# Patient Record
Sex: Male | Born: 1950
Health system: Southern US, Community
[De-identification: ages and names within clinical notes are randomized; demographics above are authoritative.]

## PROBLEM LIST (undated history)

## (undated) DIAGNOSIS — G8929 Other chronic pain: Secondary | ICD-10-CM

## (undated) DIAGNOSIS — M5430 Sciatica, unspecified side: Secondary | ICD-10-CM

## (undated) DIAGNOSIS — J45909 Unspecified asthma, uncomplicated: Secondary | ICD-10-CM

## (undated) DIAGNOSIS — I10 Essential (primary) hypertension: Secondary | ICD-10-CM

## (undated) DIAGNOSIS — M549 Dorsalgia, unspecified: Secondary | ICD-10-CM

---

## 2012-01-06 ENCOUNTER — Emergency Department (HOSPITAL_COMMUNITY)
Admission: EM | Admit: 2012-01-06 | Discharge: 2012-01-07 | Disposition: A | Discharge status: Home / Self Care | Payer: Self-pay | Attending: Emergency Medicine | Admitting: Emergency Medicine

## 2012-01-06 ENCOUNTER — Emergency Department (HOSPITAL_COMMUNITY): Payer: Self-pay

## 2012-01-06 ENCOUNTER — Encounter (HOSPITAL_COMMUNITY): Payer: Self-pay | Admitting: *Deleted

## 2012-01-06 DIAGNOSIS — Z79899 Other long term (current) drug therapy: Secondary | ICD-10-CM | POA: Insufficient documentation

## 2012-01-06 DIAGNOSIS — J40 Bronchitis, not specified as acute or chronic: Secondary | ICD-10-CM

## 2012-01-06 DIAGNOSIS — R51 Headache: Secondary | ICD-10-CM | POA: Insufficient documentation

## 2012-01-06 DIAGNOSIS — R42 Dizziness and giddiness: Secondary | ICD-10-CM

## 2012-01-06 DIAGNOSIS — R062 Wheezing: Secondary | ICD-10-CM | POA: Insufficient documentation

## 2012-01-06 DIAGNOSIS — E860 Dehydration: Secondary | ICD-10-CM

## 2012-01-06 DIAGNOSIS — M545 Low back pain, unspecified: Secondary | ICD-10-CM | POA: Insufficient documentation

## 2012-01-06 DIAGNOSIS — I10 Essential (primary) hypertension: Secondary | ICD-10-CM | POA: Insufficient documentation

## 2012-01-06 DIAGNOSIS — R296 Repeated falls: Secondary | ICD-10-CM | POA: Insufficient documentation

## 2012-01-06 HISTORY — DX: Essential (primary) hypertension: I10

## 2012-01-06 LAB — COMPREHENSIVE METABOLIC PANEL
ALT: 9 U/L (ref 0–53)
Alkaline Phosphatase: 98 U/L (ref 39–117)
BUN: 20 mg/dL (ref 6–23)
CO2: 25 mEq/L (ref 19–32)
GFR calc Af Amer: 54 mL/min — ABNORMAL LOW (ref >90)
GFR calc non Af Amer: 47 mL/min — ABNORMAL LOW (ref >90)
Glucose, Bld: 89 mg/dL (ref 70–99)
Potassium: 3.4 mEq/L — ABNORMAL LOW (ref 3.5–5.1)
Sodium: 142 mEq/L (ref 135–145)
Total Protein: 7.1 g/dL (ref 6.0–8.3)

## 2012-01-06 LAB — URINALYSIS, ROUTINE W REFLEX MICROSCOPIC
Bilirubin Urine: NEGATIVE
Glucose, UA: NEGATIVE mg/dL
Ketones, ur: NEGATIVE mg/dL
Nitrite: NEGATIVE
Protein, ur: NEGATIVE mg/dL
Specific Gravity, Urine: 1.009 (ref 1.005–1.030)
Urobilinogen, UA: 0.2 mg/dL (ref 0.0–1.0)
pH: 6.5 (ref 5.0–8.0)

## 2012-01-06 LAB — COMPREHENSIVE METABOLIC PANEL WITH GFR
AST: 13 U/L (ref 0–37)
Albumin: 3.6 g/dL (ref 3.5–5.2)
Calcium: 9.5 mg/dL (ref 8.4–10.5)
Chloride: 104 meq/L (ref 96–112)
Creatinine, Ser: 1.54 mg/dL — ABNORMAL HIGH (ref 0.50–1.35)
Total Bilirubin: 0.3 mg/dL (ref 0.3–1.2)

## 2012-01-06 LAB — URINE MICROSCOPIC-ADD ON

## 2012-01-06 LAB — CBC WITH DIFFERENTIAL/PLATELET
Basophils Absolute: 0 K/uL (ref 0.0–0.1)
Basophils Relative: 1 % (ref 0–1)
Eosinophils Absolute: 1.1 10*3/uL — ABNORMAL HIGH (ref 0.0–0.7)
Eosinophils Relative: 13 % — ABNORMAL HIGH (ref 0–5)
HCT: 39.5 % (ref 39.0–52.0)
Hemoglobin: 13.4 g/dL (ref 13.0–17.0)
Lymphocytes Relative: 34 % (ref 12–46)
Lymphs Abs: 2.8 10*3/uL (ref 0.7–4.0)
MCH: 26.8 pg (ref 26.0–34.0)
MCHC: 33.9 g/dL (ref 30.0–36.0)
MCV: 79 fL (ref 78.0–100.0)
Monocytes Absolute: 0.6 K/uL (ref 0.1–1.0)
Monocytes Relative: 7 % (ref 3–12)
Neutro Abs: 3.8 K/uL (ref 1.7–7.7)
Neutrophils Relative %: 46 % (ref 43–77)
Platelets: 191 K/uL (ref 150–400)
RBC: 5 MIL/uL (ref 4.22–5.81)
RDW: 13.1 % (ref 11.5–15.5)
WBC: 8.3 10*3/uL (ref 4.0–10.5)

## 2012-01-06 MED ORDER — SODIUM CHLORIDE 0.9 % IV BOLUS (SEPSIS)
500.0000 mL | Freq: Once | INTRAVENOUS | Status: AC
  Administered 2012-01-07: 500 mL via INTRAVENOUS

## 2012-01-06 NOTE — ED Provider Notes (Signed)
History     CSN: [id number]  Arrival date & time 01/06/12  2243   First MD Initiated Contact with Patient 01/06/12 2313      Chief Complaint  Patient presents with  . Hypertension  . Back Pain    (Consider location/radiation/quality/duration/timing/severity/associated sxs/prior treatment) HPI Comments: Pt was walking to his car and got dizzy, cannot describe to me accurately if he felt vertiginous or light headed, reports he has had a mild diffuse HA today.  He reports he fell backwards and struck back.  No LOC.  No N/V or fevers.  No CP, SOB.  He took 2 tablets of his 5 mg norvasc after he fell because he felt bad.  He was told by his doctor in [place] to take 1 daily.  He has not been it seems.  Level 5 caveat due to language barrier.    Patient is a 61 y.o. male presenting with hypertension and back pain. The history is provided by the patient.  Hypertension  Back Pain     Past Medical History  Diagnosis Date  . Hypertension     History reviewed. No pertinent past surgical history.  Family History  Problem Relation Age of Onset  . Hyperlipidemia Father     History  Substance Use Topics  . Smoking status: Not on file  . Smokeless tobacco: Not on file  . Alcohol Use: No      Review of Systems  Unable to perform ROS: Other  Musculoskeletal: Positive for back pain.    Allergies  Review of patient's allergies indicates no known allergies.  Home Medications   Current Outpatient Rx  Name Route Sig Dispense Refill  . AMLODIPINE BESYLATE 5 MG PO TABS Oral Take 5 mg by mouth daily.      BP 133/80  Pulse 90  Temp 98.8 F (37.1 C) (Oral)  Resp 20  SpO2 98%  Physical Exam  Nursing note and vitals reviewed. Constitutional: He appears well-developed and well-nourished. No distress.  HENT:  Head: Normocephalic and atraumatic.  Eyes: Pupils are equal, round, and reactive to light.  Neck: Trachea normal and full passive range of motion without pain. No JVD  present. No spinous process tenderness and no muscular tenderness present. Carotid bruit is not present. No rigidity. Normal range of motion present. No thyromegaly present.  Pulmonary/Chest: Effort normal. No respiratory distress. He has no wheezes.  Abdominal: Soft. He exhibits no distension. There is no tenderness. There is no rebound.  Musculoskeletal:       Lumbar back: He exhibits tenderness and pain. He exhibits normal range of motion, no bony tenderness, no deformity, no laceration, no spasm and normal pulse.  Neurological: He is alert. He has normal strength. No cranial nerve deficit or sensory deficit. He exhibits normal muscle tone.  Reflex Scores:      Patellar reflexes are 2+ on the right side and 2+ on the left side.      Normal finger to nose, symmetric  Skin: Skin is warm and dry. No rash noted. He is not diaphoretic.    ED Course  Procedures (including critical care time)  Labs Reviewed  URINALYSIS, ROUTINE W REFLEX MICROSCOPIC - Abnormal; Notable for the following:    Hgb urine dipstick SMALL (*)     Leukocytes, UA TRACE (*)     All other components within normal limits  CBC WITH DIFFERENTIAL - Abnormal; Notable for the following:    Eosinophils Relative 13 (*)     Eosinophils Absolute  1.1 (*)     All other components within normal limits  COMPREHENSIVE METABOLIC PANEL - Abnormal; Notable for the following:    Potassium 3.4 (*)     Creatinine, Ser 1.54 (*)     GFR calc non Af Amer 47 (*)     GFR calc Af Amer 54 (*)     All other components within normal limits  LIPASE, BLOOD  TROPONIN I  URINE MICROSCOPIC-ADD ON   Dg Chest Port 1 View  01/07/2012  *RADIOLOGY REPORT*  Clinical Data: Hypertension, chest pain.  PORTABLE CHEST - 1 VIEW  Comparison: None.  Findings: Lungs are clear. No pleural effusion or pneumothorax. The cardiomediastinal contours are within normal limits. The visualized bones and soft tissues are without significant appreciable abnormality.   IMPRESSION: No radiographic evidence of acute cardiopulmonary process.  Original Report Authenticated By: Waneta Martins, M.D.   I reviewed CXR myself,   1. Dehydration   2. Dizziness   3. Bronchitis      ECG at time 0015 shows NSR at rate 91, normal axis, no ST or T wave abns.  Normal intervals, I interpret as normal ECG.  No priors.     3:40 AM After treatments, IVF's, pt feels resolved.  He is smiling, sitting up, has walked around the ED without any dizziness, vertigo, back pain, nausea.  Will d.c home.  Pt told that he never takes norvasc like he did, just take it once daily.   MDM  Pt has told RN that he has had sub fevers, some coughing and achiness at home, did not tell me this initially.  He now has mild exp wheeze.  He has no h/o asthma.  Denies smoking.    Pt's sats are 99% on RA which I interpret as normal.  I suspect pt has a viral syndrome and pt is mildly dry and likely is cause of overall symptoms.  Will give IVF's, monitor for improvement.  Ortho sats are mildly abn, not sig.          Gavin Pound. Oletta Lamas, MD 01/07/12 [phone]

## 2012-01-06 NOTE — ED Notes (Addendum)
Patient states blood pressure has been running high today and now he has headache and lower back pain, patient states he got dizzy today and then fell and after he was able to get up he came to ED, patient states some dizziness prior to falling, patient A/O x 4 at this time

## 2012-01-07 LAB — LIPASE, BLOOD: Lipase: 30 U/L (ref 11–59)

## 2012-01-07 LAB — TROPONIN I: Troponin I: <0.3 ng/mL (ref <0.30)

## 2012-01-07 MED ORDER — FENTANYL CITRATE 0.05 MG/ML IJ SOLN
25.0000 ug | Freq: Once | INTRAMUSCULAR | Status: AC
  Administered 2012-01-07: 25 ug via INTRAVENOUS
  Filled 2012-01-07: qty 2

## 2012-01-07 MED ORDER — ALBUTEROL SULFATE HFA 108 (90 BASE) MCG/ACT IN AERS
1.0000 | INHALATION_SPRAY | Freq: Four times a day (QID) | RESPIRATORY_TRACT | Status: DC | PRN
  Administered 2012-01-07: 2 via RESPIRATORY_TRACT
  Filled 2012-01-07: qty 6.7

## 2012-01-07 NOTE — Discharge Instructions (Signed)
Dehydration, Adult Dehydration is when you lose more fluids from the body than you take in. Vital organs like the kidneys, brain, and heart cannot function without a proper amount of fluids and salt. Any loss of fluids from the body can cause dehydration.  CAUSES   Vomiting.   Diarrhea.   Excessive sweating.   Excessive urine output.   Fever.  SYMPTOMS  Mild dehydration  Thirst.   Dry lips.   Slightly dry mouth.  Moderate dehydration  Very dry mouth.   Sunken eyes.   Skin does not bounce back quickly when lightly pinched and released.   Dark urine and decreased urine production.   Decreased tear production.   Headache.  Severe dehydration  Very dry mouth.   Extreme thirst.   Rapid, weak pulse (more than 100 beats per minute at rest).   Cold hands and feet.   Not able to sweat in spite of heat and temperature.   Rapid breathing.   Blue lips.   Confusion and lethargy.   Difficulty being awakened.   Minimal urine production.   No tears.  DIAGNOSIS  Your caregiver will diagnose dehydration based on your symptoms and your exam. Blood and urine tests will help confirm the diagnosis. The diagnostic evaluation should also identify the cause of dehydration. TREATMENT  Treatment of mild or moderate dehydration can often be done at home by increasing the amount of fluids that you drink. It is best to drink small amounts of fluid more often. Drinking too much at one time can make vomiting worse. Refer to the home care instructions below. Severe dehydration needs to be treated at the hospital where you will probably be given intravenous (IV) fluids that contain water and electrolytes. HOME CARE INSTRUCTIONS   Ask your caregiver about specific rehydration instructions.   Drink enough fluids to keep your urine clear or pale yellow.   Drink small amounts frequently if you have nausea and vomiting.   Eat as you normally do.   Avoid:   Foods or drinks high in  sugar.   Carbonated drinks.   Juice.   Extremely hot or cold fluids.   Drinks with caffeine.   Fatty, greasy foods.   Alcohol.   Tobacco.   Overeating.   Gelatin desserts.   Wash your hands well to avoid spreading bacteria and viruses.   Only take over-the-counter or prescription medicines for pain, discomfort, or fever as directed by your caregiver.   Ask your caregiver if you should continue all prescribed and over-the-counter medicines.   Keep all follow-up appointments with your caregiver.  SEEK MEDICAL CARE IF:  You have abdominal pain and it increases or stays in one area (localizes).   You have a rash, stiff neck, or severe headache.   You are irritable, sleepy, or difficult to awaken.   You are weak, dizzy, or extremely thirsty.  SEEK IMMEDIATE MEDICAL CARE IF:   You are unable to keep fluids down or you get worse despite treatment.   You have frequent episodes of vomiting or diarrhea.   You have blood or green matter (bile) in your vomit.   You have blood in your stool or your stool looks black and tarry.   You have not urinated in 6 to 8 hours, or you have only urinated a small amount of very dark urine.   You have a fever.   You faint.  MAKE SURE YOU:   Understand these instructions.   Will watch your condition.     Will get help right away if you are not doing well or get worse.  Document Released: 06/24/2005 Document Revised: 06/13/2011 Document Reviewed: 02/11/2011 [facility] Patient Information 2012 [place], [place].     Bronchitis Bronchitis is a problem of the air tubes leading to your lungs. This problem makes it hard for air to get in and out of the lungs. You may cough a lot because your air tubes are narrow. Going without care can cause lasting (chronic) bronchitis. HOME CARE   Drink enough fluids to keep your pee (urine) clear or pale yellow.   Use a cool mist humidifier.   Quit smoking if you smoke. If you keep smoking, the  bronchitis might not get better.   Only take medicine as told by your doctor.  GET HELP RIGHT AWAY IF:   Coughing keeps you awake.   You start to wheeze.   You become more sick or weak.   You have a hard time breathing or get short of breath.   You cough up blood.   Coughing lasts more than 2 weeks.   You have a fever.  MAKE SURE YOU:  Understand these instructions.   Will watch your condition.   Will get help right away if you are not doing well or get worse.  Document Released: 12/11/2007 Document Revised: 06/13/2011 Document Reviewed: 05/26/2009 [facility] Patient Information 2012 [place], [place].

## 2012-01-07 NOTE — ED Notes (Signed)
Pt discharged home in good condition. 

## 2012-01-07 NOTE — ED Notes (Signed)
Pt ambulated in hall with no apparent problems. Pt denies dizziness, weakness, or nausea.

## 2012-01-28 ENCOUNTER — Emergency Department (HOSPITAL_COMMUNITY): Payer: Self-pay

## 2012-01-28 ENCOUNTER — Emergency Department (HOSPITAL_COMMUNITY)
Admission: EM | Admit: 2012-01-28 | Discharge: 2012-01-28 | Disposition: A | Discharge status: Home / Self Care | Payer: Self-pay | Attending: Emergency Medicine | Admitting: Emergency Medicine

## 2012-01-28 ENCOUNTER — Encounter (HOSPITAL_COMMUNITY): Payer: Self-pay | Admitting: Emergency Medicine

## 2012-01-28 DIAGNOSIS — R059 Cough, unspecified: Secondary | ICD-10-CM | POA: Insufficient documentation

## 2012-01-28 DIAGNOSIS — I10 Essential (primary) hypertension: Secondary | ICD-10-CM | POA: Insufficient documentation

## 2012-01-28 DIAGNOSIS — R05 Cough: Secondary | ICD-10-CM | POA: Insufficient documentation

## 2012-01-28 DIAGNOSIS — Z79899 Other long term (current) drug therapy: Secondary | ICD-10-CM | POA: Insufficient documentation

## 2012-01-28 DIAGNOSIS — R0602 Shortness of breath: Secondary | ICD-10-CM | POA: Insufficient documentation

## 2012-01-28 DIAGNOSIS — J189 Pneumonia, unspecified organism: Secondary | ICD-10-CM | POA: Insufficient documentation

## 2012-01-28 LAB — CBC
HCT: 40.4 % (ref 39.0–52.0)
Hemoglobin: 13.7 g/dL (ref 13.0–17.0)
MCH: 27 pg (ref 26.0–34.0)
MCV: 79.5 fL (ref 78.0–100.0)
RBC: 5.08 MIL/uL (ref 4.22–5.81)
WBC: 7.6 10*3/uL (ref 4.0–10.5)

## 2012-01-28 LAB — BASIC METABOLIC PANEL
BUN: 19 mg/dL (ref 6–23)
CO2: 22 mEq/L (ref 19–32)
Calcium: 9.2 mg/dL (ref 8.4–10.5)
Chloride: 103 mEq/L (ref 96–112)
Creatinine, Ser: 1.48 mg/dL — ABNORMAL HIGH (ref 0.50–1.35)
Glucose, Bld: 151 mg/dL — ABNORMAL HIGH (ref 70–99)

## 2012-01-28 MED ORDER — AZITHROMYCIN 250 MG PO TABS
500.0000 mg | ORAL_TABLET | Freq: Once | ORAL | Status: AC
  Administered 2012-01-28: 500 mg via ORAL
  Filled 2012-01-28: qty 2

## 2012-01-28 MED ORDER — AZITHROMYCIN 250 MG PO TABS: 250.0000 mg | ORAL_TABLET | Freq: Every day | ORAL | Status: AC

## 2012-01-28 MED ORDER — DEXTROSE 5 % IV SOLN
1.0000 g | Freq: Once | INTRAVENOUS | Status: AC
  Administered 2012-01-28: 1 g via INTRAVENOUS
  Filled 2012-01-28: qty 10

## 2012-01-28 MED ORDER — POTASSIUM CHLORIDE CRYS ER 20 MEQ PO TBCR
40.0000 meq | EXTENDED_RELEASE_TABLET | Freq: Once | ORAL | Status: AC
  Administered 2012-01-28: 40 meq via ORAL
  Filled 2012-01-28: qty 2

## 2012-01-28 MED ORDER — SODIUM CHLORIDE 0.9 % IV BOLUS (SEPSIS)
500.0000 mL | INTRAVENOUS | Status: AC
  Administered 2012-01-28: 500 mL via INTRAVENOUS

## 2012-01-28 NOTE — ED Notes (Signed)
PT. AMBULATED HALLWAY WITH NO ASSISTANCE , O2 SAT= 100%.

## 2012-01-28 NOTE — ED Provider Notes (Signed)
History     CSN: [id number]  Arrival date & time 01/28/12  0057   First MD Initiated Contact with Patient 01/28/12 0134      Chief Complaint  Patient presents with  . Shortness of Breath    (Consider location/radiation/quality/duration/timing/severity/associated sxs/prior treatment) HPI Comments: Patient with a history of hypertension presents emergency department with chief complaint of cough and shortness of breath.  Patient states his symptoms began approximately 2 weeks ago and it can gradually worsening.  He describes his cough is nonproductive that when he can't catch his breath he has chest tightness but denies any chest pain, pleurisy, hemoptysis, leg swelling, PND, orthopnea, claudication.  Other associated symptoms include rhinorrhea and nasal congestion. Patient currently lives at home and is a nonsmoker he also denies any fevers, night sweats, chills.  No other complaints at this time.  The history is provided by the patient.    Past Medical History  Diagnosis Date  . Hypertension     History reviewed. No pertinent past surgical history.  Family History  Problem Relation Age of Onset  . Hyperlipidemia Father     History  Substance Use Topics  . Smoking status: Never Smoker   . Smokeless tobacco: Not on file  . Alcohol Use: No      Review of Systems  All other systems reviewed and are negative.    Allergies  Review of patient's allergies indicates no known allergies.  Home Medications   Current Outpatient Rx  Name Route Sig Dispense Refill  . AMLODIPINE BESYLATE 5 MG PO TABS Oral Take 5 mg by mouth daily.    . ASPIRIN PO Oral Take 1 tablet by mouth daily.      BP 119/77  Pulse 102  Temp 97.8 F (36.6 C) (Oral)  Resp 15  SpO2 98%  Physical Exam  Nursing note and vitals reviewed. Constitutional: He is oriented to person, place, and time. He appears well-developed and well-nourished. No distress.  HENT:  Head: Normocephalic and atraumatic.  No trismus in the jaw.  Right Ear: External ear normal. No drainage or tenderness. No mastoid tenderness.  Left Ear: External ear normal. No drainage or tenderness. No mastoid tenderness.  Nose: Nose normal. No rhinorrhea or sinus tenderness.  Mouth/Throat: Uvula is midline, oropharynx is clear and moist and mucous membranes are normal. No uvula swelling. No oropharyngeal exudate.  Eyes: Conjunctivae and EOM are normal. Right eye exhibits no discharge. Left eye exhibits no discharge. No scleral icterus.  Neck: Normal range of motion. Neck supple.  Cardiovascular: Normal rate, regular rhythm and normal heart sounds.   Pulmonary/Chest: Effort normal and breath sounds normal. No stridor. No respiratory distress. He has no wheezes. He exhibits tenderness.       Lungs to auscultation bilaterally, pulse ox 98% on room air  Abdominal: Soft. There is no tenderness.  Musculoskeletal: Normal range of motion.  Neurological: He is alert and oriented to person, place, and time.  Skin: Skin is warm and dry. No rash noted. He is not diaphoretic.  Psychiatric: He has a normal mood and affect. His behavior is normal.    ED Course  Procedures (including critical care time)   Labs Reviewed  CBC  BASIC METABOLIC PANEL   Dg Chest 2 View  01/28/2012  *RADIOLOGY REPORT*  Clinical Data: Cough and shortness of breath.  CHEST - 2 VIEW  Comparison: 01/06/2012.  Findings: The cardiac silhouette, mediastinal and hilar contours are stable.  There are patchy bilateral infiltrates.  No  effusions or edema.  The bony thorax is stable.  IMPRESSION: Patchy bilateral infiltrates.  Original Report Authenticated By: P. Loralie Champagne, M.D.     No diagnosis found.    MDM  CAP  Patient has been diagnosed with CAP via chest xray. Pt is not ill appearing, immunocompromised, and does not have multiple co morbidities, therefore I feel like the they can be treated as an OP with abx therapy. Pt ambulated in ER with pulse ox  100% on RA. Pt has been advised to return to the ED if symptoms worsen or they do not improve. Pt verbalizes understanding and is agreeable with plan.          Jaci Carrel, [place] 01/28/12 [phone]

## 2012-01-28 NOTE — ED Notes (Signed)
PT. NOT READY FOR DISCHARGE , JUST RECEIVED POTTASIUM PILLS AND IV NS BOLUS 500 ML INFUSING .

## 2012-01-28 NOTE — ED Provider Notes (Signed)
Medical screening examination/treatment/procedure(s) were performed by non-physician practitioner and as supervising physician I was immediately available for consultation/collaboration.   Brandon Scarbrough, MD 01/28/12 0709 

## 2012-01-28 NOTE — ED Notes (Signed)
PT. ARRIVED WITH EMS WITH SOB AND PRODUCTIVE COUGH / CHEST CONGESTION , RUNNY NOSE AND NASAL CONGESTION ONSET THIS EVENING . PT'S FRIEND TRANSLATING ( ARABIIC ) FOR PT.

## 2012-01-29 ENCOUNTER — Emergency Department (HOSPITAL_COMMUNITY)
Admission: EM | Admit: 2012-01-29 | Discharge: 2012-01-29 | Disposition: A | Discharge status: Home / Self Care | Payer: Self-pay | Attending: Emergency Medicine | Admitting: Emergency Medicine

## 2012-01-29 ENCOUNTER — Encounter (HOSPITAL_COMMUNITY): Payer: Self-pay | Admitting: Emergency Medicine

## 2012-01-29 ENCOUNTER — Emergency Department (HOSPITAL_COMMUNITY): Payer: Self-pay

## 2012-01-29 DIAGNOSIS — R0989 Other specified symptoms and signs involving the circulatory and respiratory systems: Secondary | ICD-10-CM | POA: Insufficient documentation

## 2012-01-29 DIAGNOSIS — R0609 Other forms of dyspnea: Secondary | ICD-10-CM | POA: Insufficient documentation

## 2012-01-29 DIAGNOSIS — Z7982 Long term (current) use of aspirin: Secondary | ICD-10-CM | POA: Insufficient documentation

## 2012-01-29 DIAGNOSIS — I10 Essential (primary) hypertension: Secondary | ICD-10-CM | POA: Insufficient documentation

## 2012-01-29 DIAGNOSIS — R0600 Dyspnea, unspecified: Secondary | ICD-10-CM

## 2012-01-29 LAB — COMPREHENSIVE METABOLIC PANEL
ALT: 11 U/L (ref 0–53)
AST: 16 U/L (ref 0–37)
Alkaline Phosphatase: 118 U/L — ABNORMAL HIGH (ref 39–117)
CO2: 18 mEq/L — ABNORMAL LOW (ref 19–32)
GFR calc Af Amer: 65 mL/min — ABNORMAL LOW (ref >90)
GFR calc non Af Amer: 56 mL/min — ABNORMAL LOW (ref >90)
Glucose, Bld: 153 mg/dL — ABNORMAL HIGH (ref 70–99)
Potassium: 3.5 mEq/L (ref 3.5–5.1)
Sodium: 140 mEq/L (ref 135–145)

## 2012-01-29 LAB — CBC
Hemoglobin: 13.7 g/dL (ref 13.0–17.0)
Platelets: 162 10*3/uL (ref 150–400)
RBC: 5.05 MIL/uL (ref 4.22–5.81)
WBC: 6.9 10*3/uL (ref 4.0–10.5)

## 2012-01-29 LAB — POCT I-STAT 3, ART BLOOD GAS (G3+)
Bicarbonate: 20.5 mEq/L (ref 20.0–24.0)
TCO2: 22 mmol/L (ref 0–100)
pH, Arterial: 7.372 (ref 7.350–7.450)
pO2, Arterial: 79 mmHg — ABNORMAL LOW (ref 80.0–100.0)

## 2012-01-29 LAB — D-DIMER, QUANTITATIVE: D-Dimer, Quant: 0.38 ug/mL-FEU (ref 0.00–0.48)

## 2012-01-29 LAB — CK TOTAL AND CKMB (NOT AT ARMC): Relative Index: INVALID (ref 0.0–2.5)

## 2012-01-29 MED ORDER — PREDNISONE 10 MG PO TABS: 50.0000 mg | ORAL_TABLET | Freq: Every day | ORAL | Status: DC

## 2012-01-29 MED ORDER — METHYLPREDNISOLONE SODIUM SUCC 125 MG IJ SOLR
125.0000 mg | Freq: Once | INTRAMUSCULAR | Status: AC
  Administered 2012-01-29: 125 mg via INTRAVENOUS
  Filled 2012-01-29: qty 2

## 2012-01-29 MED ORDER — LORAZEPAM 2 MG/ML IJ SOLN: 0.5000 mg | Freq: Once | INTRAMUSCULAR | Status: DC

## 2012-01-29 MED ORDER — ALBUTEROL SULFATE HFA 108 (90 BASE) MCG/ACT IN AERS: 1.0000 | INHALATION_SPRAY | Freq: Four times a day (QID) | RESPIRATORY_TRACT | Status: DC | PRN

## 2012-01-29 MED ORDER — ALBUTEROL SULFATE (5 MG/ML) 0.5% IN NEBU
2.5000 mg | INHALATION_SOLUTION | Freq: Once | RESPIRATORY_TRACT | Status: AC
  Administered 2012-01-29: 5 mg via RESPIRATORY_TRACT
  Filled 2012-01-29: qty 1

## 2012-01-29 NOTE — ED Notes (Signed)
Pt reports very SOB with nasal and chest congestion currently taking zithromycin from PMD

## 2012-01-29 NOTE — ED Notes (Signed)
Pt presents w/ grunting respirations and tachypnic - neb tx in progress from triage. Lung sounds clear and equal bilat - pt w/ language barrier however states his eyes are watering and his nose "is closed." Pt w/ nasal congestion. Pt coached to slow breathing. Pt repetitive about eyes watering and nasal congestion.

## 2012-01-29 NOTE — ED Provider Notes (Addendum)
History     CSN: [id number]  Arrival date & time 01/29/12  6578   First MD Initiated Contact with Patient 01/29/12 [phone]      Chief Complaint  Patient presents with  . Shortness of Breath    (Consider location/radiation/quality/duration/timing/severity/associated sxs/prior treatment) Patient is a 61 y.o. male presenting with shortness of breath. The history is provided by the patient.  Shortness of Breath  The current episode started today. The problem has been unchanged. The problem is moderate. Nothing relieves the symptoms. Associated symptoms include shortness of breath. The cough is non-productive. Nothing relieves the cough. Nothing worsens the cough. He was not exposed to toxic fumes. He has not inhaled smoke recently. He has had no prior steroid use. He has had no prior intubations. Urine output has been normal.    Past Medical History  Diagnosis Date  . Hypertension     History reviewed. No pertinent past surgical history.  Family History  Problem Relation Age of Onset  . Hyperlipidemia Father     History  Substance Use Topics  . Smoking status: Never Smoker   . Smokeless tobacco: Not on file  . Alcohol Use: No      Review of Systems  Respiratory: Positive for shortness of breath.   All other systems reviewed and are negative.    Allergies  Review of patient's allergies indicates no known allergies.  Home Medications   Current Outpatient Rx  Name Route Sig Dispense Refill  . AMLODIPINE BESYLATE 5 MG PO TABS Oral Take 5 mg by mouth daily.    . ASPIRIN PO Oral Take 1 tablet by mouth daily.    . AZITHROMYCIN 250 MG PO TABS Oral Take 1 tablet (250 mg total) by mouth daily. 4 tablet 0    BP 130/89  Pulse 115  Temp 97.9 F (36.6 C) (Oral)  Resp 25  SpO2 98%  Physical Exam  Constitutional: He is oriented to person, place, and time. He appears well-developed and well-nourished.  HENT:  Head: Normocephalic and atraumatic.  Eyes: Conjunctivae are  normal. Pupils are equal, round, and reactive to light.  Neck: Normal range of motion. Neck supple.  Cardiovascular: Normal rate, regular rhythm, normal heart sounds and intact distal pulses.   Pulmonary/Chest: Tachypnea noted. He has wheezes.  Abdominal: Soft. Bowel sounds are normal.  Neurological: He is alert and oriented to person, place, and time.  Skin: Skin is warm and dry.  Psychiatric: He has a normal mood and affect. His behavior is normal. Judgment and thought content normal.    ED Course  Procedures (including critical care time)   Labs Reviewed  CBC  COMPREHENSIVE METABOLIC PANEL  TROPONIN I  CK TOTAL AND CKMB  CULTURE, BLOOD (ROUTINE X 2)  CULTURE, BLOOD (ROUTINE X 2)   Dg Chest 2 View  01/28/2012  *RADIOLOGY REPORT*  Clinical Data: Cough and shortness of breath.  CHEST - 2 VIEW  Comparison: 01/06/2012.  Findings: The cardiac silhouette, mediastinal and hilar contours are stable.  There are patchy bilateral infiltrates.  No effusions or edema.  The bony thorax is stable.  IMPRESSION: Patchy bilateral infiltrates.  Original Report Authenticated By: P. Loralie Champagne, M.D.     No diagnosis found.   Date: 04/01/2012  Rate: 105  Rhythm: sinus tachycardia  QRS Axis: normal  Intervals: normal  ST/T Wave abnormalities: normal  Conduction Disutrbances: none  Narrative Interpretation: unremarkable     MDM  + dyspnea,  Will ibr,  Steriod,  Xray,  Lab,  Reassess.     cxr improved.  Sat 95 on ra.  Dyspnea resolved.  Will dc with steriod,  Inhaler, to fu outpt,  Ret new/worsening sxs.  Not acidotic,  Not retaining co2.  D-dimer neg.  ce neg.  Clinical improved     Denese Mentink Lytle Michaels, MD 01/29/12 0441  Raffaela Ladley Lytle Michaels, MD 01/29/12 7829  Rosanne Ashing, MD 01/29/12 5621  Bonny Vanleeuwen Lytle Michaels, MD 04/01/12 3086

## 2012-01-29 NOTE — ED Notes (Signed)
Pt w/ decreased repsiration and appears much more calm on reassessment. Resp approx 16-18rpm and pulse ox 99% on 2L via [place]. Pt remains alert.

## 2012-01-29 NOTE — ED Notes (Signed)
RT notified of need for ABG.

## 2012-02-04 LAB — CULTURE, BLOOD (ROUTINE X 2): Culture: NO GROWTH

## 2013-12-27 ENCOUNTER — Emergency Department (HOSPITAL_COMMUNITY)
Admission: EM | Admit: 2013-12-27 | Discharge: 2013-12-27 | Disposition: A | Discharge status: Home / Self Care | Payer: Self-pay | Attending: Emergency Medicine | Admitting: Emergency Medicine

## 2013-12-27 ENCOUNTER — Encounter (HOSPITAL_COMMUNITY): Payer: Self-pay | Admitting: Emergency Medicine

## 2013-12-27 DIAGNOSIS — I10 Essential (primary) hypertension: Secondary | ICD-10-CM | POA: Insufficient documentation

## 2013-12-27 DIAGNOSIS — M5430 Sciatica, unspecified side: Secondary | ICD-10-CM | POA: Insufficient documentation

## 2013-12-27 DIAGNOSIS — M5431 Sciatica, right side: Secondary | ICD-10-CM

## 2013-12-27 DIAGNOSIS — Z79899 Other long term (current) drug therapy: Secondary | ICD-10-CM | POA: Insufficient documentation

## 2013-12-27 MED ORDER — HYDROCODONE-ACETAMINOPHEN 5-325 MG PO TABS
2.0000 | ORAL_TABLET | Freq: Once | ORAL | Status: AC
  Administered 2013-12-27: 2 via ORAL
  Filled 2013-12-27: qty 2

## 2013-12-27 MED ORDER — CYCLOBENZAPRINE HCL 10 MG PO TABS: 10.0000 mg | ORAL_TABLET | Freq: Two times a day (BID) | ORAL | Status: DC | PRN

## 2013-12-27 MED ORDER — NAPROXEN 500 MG PO TABS: 500.0000 mg | ORAL_TABLET | Freq: Two times a day (BID) | ORAL | Status: DC

## 2013-12-27 MED ORDER — CYCLOBENZAPRINE HCL 10 MG PO TABS
10.0000 mg | ORAL_TABLET | Freq: Once | ORAL | Status: AC
  Administered 2013-12-27: 10 mg via ORAL
  Filled 2013-12-27: qty 1

## 2013-12-27 NOTE — ED Provider Notes (Signed)
Medical screening examination/treatment/procedure(s) were performed by non-physician practitioner and as supervising physician I was immediately available for consultation/collaboration.   EKG Interpretation None        Enid Skeens, MD 12/27/13 [phone]

## 2013-12-27 NOTE — Discharge Instructions (Signed)
Take Naprosyn as needed for pain. Take Flexeril as needed for muscle spasm. Follow up with a primary care provider on the resource guide.    Emergency Department Resource Guide 1) Find a Doctor and Pay Out of Pocket Although you won't have to find out who is covered by your insurance plan, it is a good idea to ask around and get recommendations. You will then need to call the office and see if the doctor you have chosen will accept you as a new patient and what types of options they offer for patients who are self-pay. Some doctors offer discounts or will set up payment plans for their patients who do not have insurance, but you will need to ask so you aren't surprised when you get to your appointment.  2) Contact Your Local Health Department Not all health departments have doctors that can see patients for sick visits, but many do, so it is worth a call to see if yours does. If you don't know where your local health department is, you can check in your phone book. The CDC also has a tool to help you locate your state's health department, and many state websites also have listings of all of their local health departments.  3) Find a Walk-in Clinic If your illness is not likely to be very severe or complicated, you may want to try a walk in clinic. These are popping up all over the country in pharmacies, drugstores, and shopping centers. They're usually staffed by nurse practitioners or physician assistants that have been trained to treat common illnesses and complaints. They're usually fairly quick and inexpensive. However, if you have serious medical issues or chronic medical problems, these are probably not your best option.  No Primary Care Doctor: - Call Health Connect at  [phone] - they can help you locate a primary care doctor that  accepts your insurance, provides certain services, etc. - Physician Referral Service- [phone]  Chronic Pain Problems: Organization          Address  Phone   Notes  [facility]  [phone] Patients need to be referred by their primary care doctor.   Medication Assistance: Organization         Address  Phone   Notes  [facility] Medication [facility] [address] [place] [place], [place] [address] [phone] --Must be a resident of [facility] -- Must have NO insurance coverage whatsoever (no Medicaid/ Medicare, etc.) -- The pt. MUST have a primary care doctor that directs their care regularly and follows them in the community   [facility]  [phone]   [employer]  [phone]    Agencies that provide inexpensive medical care: Organization         Address  Phone   Notes  [facility]  [phone]   [facility]    [phone]   [facility] [address] [place], [place] [address] [phone]   [facility] [address] [place], [place] [phone]   [facility]    [phone]   [facility]    [phone]   [facility]  [address], [place] Phone:  [phone], Fax:  [phone] Hours of Operation:  9 am - 6 pm, M-F.  Also accepts Medicaid/Medicare and self-pay.  [facility]  [address], [address]  Phone: [phone], Fax: [phone]. Hours of Operation:  8:30 am - 5:30 pm, M-F.  Also accepts Medicaid and self-pay.  °[facility] [place] [address], [place] Phone: ([phone]) [phone]   °[facility] [address], [place], [place] ([phone], Ext. 123 Mondays & Thursdays: 7-9 AM.  First 15 patients are seen on a first come, first serve basis. °  ° °Medicaid-accepting [place] Providers: ° °Organization         Address  Phone   Notes  °[facility] [address], [place] ([phone] Also accepts self-pay patients.  °[facility] [address], [place] ° ([phone]) [phone]   °[facility] [address], [place] ([phone]) [phone]   °[facility] [address] [place] [address], [place] ([phone]) [phone]   °Veita Bland [address], [place]  ° ([phone]) [phone] Only accepts [place] Access Medicaid patients after they have their name applied to their card.  ° °Self-Pay (no insurance) in [place]: ° °Organization         Address  Phone   Notes  °Sickle Cell Patients, [facility] [address], [place] ([phone]) [phone]   °[facility] [address], [place] ([phone]) [phone]   °[facility] ° [address], [place] ([phone]) [phone]   °[facility]/Dr. Osei-Bonsu ° [address] [place] [address], [place] or [address], [place] ([phone]) [phone] Phone number for both [place] and [place] locations is the same.  °[facility] [address], [place] ([phone]) [phone]   °[facility] [address] [place] [address], [place] or [address] ([phone]) [phone] °(336) [phone]   °[facility] [address], [place] (336) [phone], phone; ([phone]) [phone], fax Sees patients 1st and 3rd Saturday of every month.  Must not qualify for public or private insurance (i.e. Medicaid, Medicare, [place] Health Choice, Veterans' Benefits) • Household income should be no more than 200% of the poverty level •The clinic cannot treat you if you are pregnant or think you are pregnant • Sexually transmitted diseases are not treated at the clinic.  ° ° °Dental Care: °Organization         Address  Phone  Notes  °[place] [facility] [address], [place] ([phone]) [phone] Accepts children up to age 21 who are enrolled in Medicaid or [place] Health Choice; pregnant women with a Medicaid card; and  children who have applied for Medicaid or [place] Health Choice, but were declined, whose parents can pay a reduced fee at time of service.  °[place] [facility] [place]  [address], [place] (336) [phone] Accepts children up to age 21 who are enrolled in Medicaid or [place] Health Choice; pregnant women with a Medicaid card; and children who have applied for Medicaid or  Health Choice, but were declined, whose parents can pay a reduced fee at time of service.  °[facility] ° [address],  ([phone]) [phone] Patients are seen by appointment only. Walk-ins are not accepted. [facility] will see patients 18 years of age and older. °Monday - Tuesday (8am-5pm) °Most Wednesdays (8:30-5pm) °$30 per visit, cash only  °[facility] ° [address], [facility] [phone] Patients are seen by appointment only. Walk-ins are not accepted. [facility] will see patients 55 years of age and older. One Wednesday Evening (Monthly: Volunteer Based).  $30 per visit, cash only  [employer]  [phone] for adults; Children under age 26, call Graduate Pediatric Dentistry at [phone]. Children aged 79-14, please call [phone] to request a pediatric application.  Dental services are provided in all areas of dental care including fillings, crowns and bridges, complete and partial dentures, implants, gum treatment, root canals, and extractions. Preventive care is also provided. Treatment is provided to both adults and children. Patients are selected via a lottery and there is often a waiting list.   [facility] [address] [place]  [phone] [email or web address]   [facility] [address] [place], [place] [phone], Ext. [phone] Second and Fourth Thursday of each month, opens at 6:30 AM; Clinic ends at 9 AM.  Patients are seen on a first-come first-served  basis, and a limited number are seen during each clinic.   [facility]  [address] [place], [place] [phone]   Eligibility Requirements You must have lived in [place], [place], or [place] counties for at least the last three months.   You cannot be eligible for state or federal sponsored [place], including [employer], [place], or [employer].   You generally cannot be eligible for healthcare insurance through your employer.    How to apply: Eligibility screenings are held every Tuesday and Wednesday afternoon from 1:00 pm until 4:00 pm. You do not need an appointment for the interview!  [facility] [address], [place], [place] [phone]   [facility]  [phone]   [facility] Department  [phone]   [facility] Health Department  [phone]    Behavioral Health Resources in the Community: Intensive Outpatient Programs Organization         Address  Phone  Notes  [facility] [address] [place], [place] [phone]   [facility] [address] [place], [place] [phone]   [facility] [address], [place], [place]  [phone]   [facility] [address],  [place], [place] [phone] or [phone]   Substance Abuse Resources Organization         Address  Phone  Notes  [facility]  ([phone]   [facility]  [phone]   [facility] [place]  9292810002   Floydene Flock  [phone]   Residential & Outpatient Substance Abuse Program  ([phone])[phone]   Psychological Services Organization         Address  Phone  Notes  [facility]  [phone]   [facility]  [phone]   [facility] [address] [place] [phone] or [phone]    Mobile Crisis Teams Organization          Address  Phone  Notes  [facility], Mobile Crisis Care Unit  [phone]   [facility]  [address] [place], [place] [phone]   Doristine Locks [address] [place] [phone]    Self-Help/Support Groups Organization         Address  Phone             Notes  [facility] - variety of  support groups  336- [phone] Call for more information  °Narcotics Anonymous (NA), [facility] [address], °[place]  2 meetings at this location  ° °Residential Treatment Programs °Organization         Address  Phone  Notes  °[facility] [address],    °[place]  [phone]   °[facility] ° [address], [place], [place] [phone]   °[facility] [address], [place] [phone] Admissions: 8am-3pm M-F  °[facility] [address]    °[place], [place] [phone]   °[facility] [address], [place], [place] [phone]   °[facility] [address]  °[place], [place] [phone]   °[facility] Intensive Outpatient [address], [place], [place] [phone]   °[facility] [address]  °[place], [place] 1-877-615-2722 or [phone]   °[facility] ([facility]) [address] [place], [place] [phone] Accepts Medicaid  °[facility] [address]  °[place] 1-800-659-3381 Substance Abuse/Addiction Treatment  ° °[place] Behavioral Health Resources °Organization         Address  Phone  Notes  °[facility]  (888) [phone]   °Julie Brannon, PhD [address] [place], [place]   (336) [phone] or (336) [phone]   °[facility]   [address] °[place], [place] (336) [phone]   °[facility] [address], [place], [place] (336) [phone] Insurance/Medicaid/sponsorship  through [facility]  °[facility] [address]                                    [place], [place] (336) [phone] Therapy/tele-psych/case  °[facility] [address]  ° [place], [place] (336) [phone]    °Dr. Arfeen  ([phone]) [phone]   °[facility] [place]  [employer] [place] [facility] 1) [address], [place] °2) [address], [place] °3)  [address], [place] (336) [phone] °(336) [phone] ° °(336) [phone]   °[place] Child Abuse Hotline (336) 342-1394 or (336) [phone] (After Hours)    ° ° ° °

## 2013-12-27 NOTE — ED Provider Notes (Signed)
CSN: [id number]     Arrival date & time 12/27/13  0520 History   First MD Initiated Contact with Patient 12/27/13 [phone]     Chief Complaint  Patient presents with  . Insomnia     (Consider location/radiation/quality/duration/timing/severity/associated sxs/prior Treatment) HPI Comments: Patient is a 63 year old male who presents with gradual onset of lower back pain that started 1 week ago. The pain is aching and moderate and does not radiate. The pain is constant and located in his right buttock. Movement and palpation makes the pain worse. Nothing makes the pain better. Patient has not tried anything for pain. Patient reports associated insomnia for the past couple nights. He is unsure if the insomnia is due to pain. He denies any other associated symptoms. No saddle paresthesias or bladder/bowel incontinence. Patient denies any injury.      Past Medical History  Diagnosis Date  . Hypertension    History reviewed. No pertinent past surgical history. Family History  Problem Relation Age of Onset  . Hyperlipidemia Father    History  Substance Use Topics  . Smoking status: Never Smoker   . Smokeless tobacco: Not on file  . Alcohol Use: No    Review of Systems  Constitutional: Negative for fever, chills and fatigue.  HENT: Negative for trouble swallowing.   Eyes: Negative for visual disturbance.  Respiratory: Negative for shortness of breath.   Cardiovascular: Negative for chest pain and palpitations.  Gastrointestinal: Negative for nausea, vomiting, abdominal pain and diarrhea.  Genitourinary: Negative for dysuria and difficulty urinating.  Musculoskeletal: Positive for back pain. Negative for arthralgias and neck pain.  Skin: Negative for color change.  Neurological: Negative for dizziness and weakness.  Psychiatric/Behavioral: Positive for sleep disturbance. Negative for dysphoric mood.      Allergies  Review of patient's allergies indicates no known allergies.  Home  Medications   Prior to Admission medications   Medication Sig Start Date End Date Taking? Authorizing Provider  amLODipine (NORVASC) 5 MG tablet Take 5 mg by mouth daily.   Yes Historical Provider, MD   BP 135/77  Pulse 89  Temp(Src) 97.9 F (36.6 C) (Oral)  Resp 24  Ht 5\' 10"  (1.778 m)  Wt 175 lb (79.379 kg)  BMI 25.11 kg/m2  SpO2 99% Physical Exam  Nursing note and vitals reviewed. Constitutional: He is oriented to person, place, and time. He appears well-developed and well-nourished. No distress.  HENT:  Head: Normocephalic and atraumatic.  Eyes: Conjunctivae and EOM are normal.  Neck: Normal range of motion.  Cardiovascular: Normal rate and regular rhythm.  Exam reveals no gallop and no friction rub.   No murmur heard. Pulmonary/Chest: Effort normal and breath sounds normal. He has no wheezes. He has no rales. He exhibits no tenderness.  Abdominal: Soft. He exhibits no distension. There is no tenderness. There is no rebound.  Musculoskeletal: Normal range of motion.  No midline spine tenderness to palpation. Right buttock tenderness to palpation. Straight leg raise positive on the right.   Neurological: He is alert and oriented to person, place, and time. Coordination normal.  Lower extremity strength and sensation equal and intact bilaterally. Speech is goal-oriented. Moves limbs without ataxia.   Skin: Skin is warm and dry.  Psychiatric: He has a normal mood and affect. His behavior is normal.    ED Course  Procedures (including critical care time) Labs Review Labs Reviewed - No data to display  Imaging Review No results found.   EKG Interpretation None  MDM   Final diagnoses:  Sciatica, right    6:26 AM Patient having right sciatica pain which is likely preventing him from sleeping. Patient will have Vicodin and Flexeril here for pain. Patient will be discharged with Flexeril and Naprosyn for pain. Vitals stable and patient afebrile. No bladder/bowel  incontinence or saddle paresthesias.     Emilia Beck, [place] 12/27/13 [phone]

## 2013-12-27 NOTE — ED Notes (Signed)
Patient presents from home via EMS with complaints of not being able to sleep for 5 day (per EMS) he told the Arabic int it was 12 days he has not slept

## 2013-12-27 NOTE — ED Notes (Signed)
Patient presents from home stating he cannot sleep.  States he closes his eyes and then they open and he cannot go back to sleep - per patient  Has either been going on for 5 or 12 days  Unsure of amount of time he has not been able to sleep

## 2014-02-27 ENCOUNTER — Encounter (HOSPITAL_COMMUNITY): Payer: Self-pay | Admitting: Emergency Medicine

## 2014-02-27 ENCOUNTER — Emergency Department (HOSPITAL_COMMUNITY): Payer: Self-pay

## 2014-02-27 ENCOUNTER — Emergency Department (HOSPITAL_COMMUNITY)
Admission: EM | Admit: 2014-02-27 | Discharge: 2014-02-27 | Disposition: A | Discharge status: Home / Self Care | Payer: Self-pay | Attending: Emergency Medicine | Admitting: Emergency Medicine

## 2014-02-27 DIAGNOSIS — M5431 Sciatica, right side: Secondary | ICD-10-CM

## 2014-02-27 DIAGNOSIS — I10 Essential (primary) hypertension: Secondary | ICD-10-CM | POA: Insufficient documentation

## 2014-02-27 DIAGNOSIS — M5430 Sciatica, unspecified side: Secondary | ICD-10-CM | POA: Insufficient documentation

## 2014-02-27 DIAGNOSIS — M545 Low back pain, unspecified: Secondary | ICD-10-CM | POA: Insufficient documentation

## 2014-02-27 DIAGNOSIS — Z791 Long term (current) use of non-steroidal anti-inflammatories (NSAID): Secondary | ICD-10-CM | POA: Insufficient documentation

## 2014-02-27 DIAGNOSIS — Z79899 Other long term (current) drug therapy: Secondary | ICD-10-CM | POA: Insufficient documentation

## 2014-02-27 MED ORDER — METHOCARBAMOL 500 MG PO TABS: 500.0000 mg | ORAL_TABLET | Freq: Two times a day (BID) | ORAL | Status: DC

## 2014-02-27 MED ORDER — OXYCODONE-ACETAMINOPHEN 5-325 MG PO TABS: ORAL_TABLET | ORAL | Status: DC

## 2014-02-27 MED ORDER — KETOROLAC TROMETHAMINE 60 MG/2ML IM SOLN
60.0000 mg | Freq: Once | INTRAMUSCULAR | Status: AC
  Administered 2014-02-27: 60 mg via INTRAMUSCULAR
  Filled 2014-02-27: qty 2

## 2014-02-27 MED ORDER — HYDROMORPHONE HCL PF 1 MG/ML IJ SOLN
1.0000 mg | Freq: Once | INTRAMUSCULAR | Status: AC
  Administered 2014-02-27: 1 mg via INTRAMUSCULAR
  Filled 2014-02-27: qty 1

## 2014-02-27 MED ORDER — HYDROMORPHONE HCL PF 1 MG/ML IJ SOLN: 2.0000 mg | Freq: Once | INTRAMUSCULAR | Status: DC

## 2014-02-27 MED ORDER — ONDANSETRON HCL 4 MG/2ML IJ SOLN
4.0000 mg | Freq: Once | INTRAMUSCULAR | Status: AC
  Administered 2014-02-27: 4 mg via INTRAMUSCULAR
  Filled 2014-02-27: qty 2

## 2014-02-27 MED ORDER — HYDROCODONE-ACETAMINOPHEN 5-325 MG PO TABS
1.0000 | ORAL_TABLET | Freq: Once | ORAL | Status: AC
  Administered 2014-02-27: 1 via ORAL
  Filled 2014-02-27: qty 1

## 2014-02-27 MED ORDER — PREDNISONE 10 MG PO TABS
ORAL_TABLET | ORAL | Status: DC
Start: 2014-02-27 — End: 2014-03-06

## 2014-02-27 NOTE — ED Provider Notes (Signed)
CSN: [id number]     Arrival date & time 02/27/14  0422 History   First MD Initiated Contact with Patient 02/27/14 [phone]     Chief Complaint  Patient presents with  . Back Pain     (Consider location/radiation/quality/duration/timing/severity/associated sxs/prior Treatment) Patient is a 64 y.o. male presenting with back pain. The history is provided by the patient. No language interpreter was used.  Back Pain Location:  Lumbar spine Quality:  Aching and shooting Radiates to:  Does not radiate Pain severity:  Moderate Pain is:  Same all the time Onset quality:  Gradual Duration:  4 days Timing:  Constant Progression:  Worsening Chronicity:  New Context: not twisting   Relieved by:  Nothing Worsened by:  Nothing tried Ineffective treatments:  None tried Risk factors: no hx of cancer and no vascular disease     Past Medical History  Diagnosis Date  . Hypertension    History reviewed. No pertinent past surgical history. Family History  Problem Relation Age of Onset  . Hyperlipidemia Father    History  Substance Use Topics  . Smoking status: Never Smoker   . Smokeless tobacco: Not on file  . Alcohol Use: No    Review of Systems  Musculoskeletal: Positive for back pain.  All other systems reviewed and are negative.     Allergies  Review of patient's allergies indicates no known allergies.  Home Medications   Prior to Admission medications   Medication Sig Start Date End Date Taking? Authorizing Provider  amLODipine (NORVASC) 5 MG tablet Take 5 mg by mouth daily.    Historical Provider, MD  cyclobenzaprine (FLEXERIL) 10 MG tablet Take 1 tablet (10 mg total) by mouth 2 (two) times daily as needed for muscle spasms. 12/27/13   Kaitlyn Szekalski, PA-C  naproxen (NAPROSYN) 500 MG tablet Take 1 tablet (500 mg total) by mouth 2 (two) times daily with a meal. 12/27/13   Kaitlyn Szekalski, PA-C   BP 131/84  Pulse 64  Temp(Src) 98.1 F (36.7 C) (Oral)  Resp 22  SpO2  100% Physical Exam  Nursing note and vitals reviewed. Constitutional: He is oriented to person, place, and time. He appears well-developed and well-nourished.  HENT:  Head: Normocephalic and atraumatic.  Eyes: Conjunctivae and EOM are normal. Pupils are equal, round, and reactive to light.  Neck: Normal range of motion.  Cardiovascular: Normal rate and normal heart sounds.   Pulmonary/Chest: Effort normal.  Abdominal: Soft. He exhibits no distension.  Musculoskeletal: Normal range of motion.  Neurological: He is alert and oriented to person, place, and time.  Skin: Skin is warm.  Psychiatric: He has a normal mood and affect.    ED Course  Procedures (including critical care time) Labs Review Labs Reviewed - No data to display  Imaging Review No results found.   EKG Interpretation None     Results for orders placed during the hospital encounter of 01/29/12  CULTURE, BLOOD (ROUTINE X 2)      Result Value Ref Range   Specimen Description BLOOD RIGHT ARM     Special Requests BOTTLES DRAWN AEROBIC ONLY 10CC     Culture  Setup Time 01/29/2012 09:47     Culture NO GROWTH 5 DAYS     Report Status 02/04/2012 FINAL    CULTURE, BLOOD (ROUTINE X 2)      Result Value Ref Range   Specimen Description BLOOD RIGHT HAND     Special Requests BOTTLES DRAWN AEROBIC ONLY 10CC  Culture  Setup Time 01/29/2012 09:47     Culture NO GROWTH 5 DAYS     Report Status 02/04/2012 FINAL    CBC      Result Value Ref Range   WBC 6.9  4.0 - 10.5 K/uL   RBC 5.05  4.22 - 5.81 MIL/uL   Hemoglobin 13.7  13.0 - 17.0 g/dL   HCT 48.2  50.0 - 37.0 %   MCV 80.6  78.0 - 100.0 fL   MCH 27.1  26.0 - 34.0 pg   MCHC 33.7  30.0 - 36.0 g/dL   RDW 48.8  89.1 - 69.4 %   Platelets 162  150 - 400 K/uL  COMPREHENSIVE METABOLIC PANEL      Result Value Ref Range   Sodium 140  135 - 145 mEq/L   Potassium 3.5  3.5 - 5.1 mEq/L   Chloride 103  96 - 112 mEq/L   CO2 18 (*) 19 - 32 mEq/L   Glucose, Bld 153 (*) 70 -  99 mg/dL   BUN 20  6 - 23 mg/dL   Creatinine, Ser 5.03  0.50 - 1.35 mg/dL   Calcium 9.4  8.4 - 88.8 mg/dL   Total Protein 7.3  6.0 - 8.3 g/dL   Albumin 3.6  3.5 - 5.2 g/dL   AST 16  0 - 37 U/L   ALT 11  0 - 53 U/L   Alkaline Phosphatase 118 (*) 39 - 117 U/L   Total Bilirubin 0.1 (*) 0.3 - 1.2 mg/dL   GFR calc non Af Amer 56 (*) >90 mL/min   GFR calc Af Amer 65 (*) >90 mL/min  TROPONIN I      Result Value Ref Range   Troponin I <0.30  <0.30 ng/mL  CK TOTAL AND CKMB      Result Value Ref Range   Total CK 36  7 - 232 U/L   CK, MB 1.2  0.3 - 4.0 ng/mL   Relative Index RELATIVE INDEX IS INVALID  0.0 - 2.5  D-DIMER, QUANTITATIVE      Result Value Ref Range   D-Dimer, Quant 0.38  0.00 - 0.48 ug/mL-FEU  POCT I-STAT 3, BLOOD GAS (G3+)      Result Value Ref Range   pH, Arterial 7.372  7.350 - 7.450   pCO2 arterial 35.4  35.0 - 45.0 mmHg   pO2, Arterial 79.0 (*) 80.0 - 100.0 mmHg   Bicarbonate 20.5  20.0 - 24.0 mEq/L   TCO2 22  0 - 100 mmol/L   O2 Saturation 95.0     Acid-base deficit 4.0 (*) 0.0 - 2.0 mmol/L   Collection site RADIAL, ALLEN'S TEST ACCEPTABLE     Drawn by Operator     Sample type ARTERIAL     Dg Lumbar Spine Complete  02/27/2014   CLINICAL DATA:  Lower back pain.  EXAM: LUMBAR SPINE - COMPLETE 4+ VIEW  COMPARISON:  None.  FINDINGS: There is no evidence of acute lumbar spine fracture. Mild depression of superior endplate of L1 vertebral body is noted consistent with old fracture. Alignment is normal. Intervertebral disc spaces are maintained. Osteophyte formation is noted anteriorly at L5-S1 as well the left at L3-4. Posterior facet joints appear normal.  IMPRESSION: Mild degenerative changes as described above. No acute abnormality seen in the lumbar spine.   Electronically Signed   By: Roque Lias M.D.   On: 02/27/2014 08:15    MDM   Final diagnoses:  Sciatica, right  Prednisone Percocet Robaxin Schedule to see Dr. Dion Saucier for evaluation    Elson Areas,  PA-C 02/27/14 [phone]

## 2014-02-27 NOTE — ED Notes (Signed)
Pt reports chronic back pain. Recently diagnosed with sciatica. Was moving a heavy couch 4 days ago, hurt his back and fell on floor. Pt reports back pain 10/10. Pt able to move both legs without difficulty. Ambulated to bathroom with little assistance.

## 2014-02-27 NOTE — Discharge Instructions (Signed)
Degenerative Disk Disease  Degenerative disk disease is a condition caused by the changes that occur in the cushions of the backbone (spinal disks) as you grow older. Spinal disks are soft and compressible disks located between the bones of the spine (vertebrae). They act like shock absorbers. Degenerative disk disease can affect the whole spine. However, the neck and lower back are most commonly affected. Many changes can occur in the spinal disks with aging, such as:   The spinal disks may dry and shrink.   Small tears may occur in the tough, outer covering of the disk (annulus).   The disk space may become smaller due to loss of water.   Abnormal growths in the bone (spurs) may occur. This can put pressure on the nerve roots exiting the spinal canal, causing pain.   The spinal canal may become narrowed.  CAUSES   Degenerative disk disease is a condition caused by the changes that occur in the spinal disks with aging. The exact cause is not known, but there is a genetic basis for many patients. Degenerative changes can occur due to loss of fluid in the disk. This makes the disk thinner and reduces the space between the backbones. Small cracks can develop in the outer layer of the disk. This can lead to the breakdown of the disk. You are more likely to get degenerative disk disease if you are overweight. Smoking cigarettes and doing heavy work such as weightlifting can also increase your risk of this condition. Degenerative changes can start after a sudden injury. Growth of bone spurs can compress the nerve roots and cause pain.   SYMPTOMS   The symptoms vary from person to person. Some people may have no pain, while others have severe pain. The pain may be so severe that it can limit your activities. The location of the pain depends on the part of your backbone that is affected. You will have neck or arm pain if a disk in the neck area is affected. You will have pain in your back, buttocks, or legs if a disk  in the lower back is affected. The pain becomes worse while bending, reaching up, or with twisting movements. The pain may start gradually and then get worse as time passes. It may also start after a major or minor injury. You may feel numbness or tingling in the arms or legs.   DIAGNOSIS   Your caregiver will ask you about your symptoms and about activities or habits that may cause the pain. He or she may also ask about any injuries, diseases, or treatments you have had earlier. Your caregiver will examine you to check for the range of movement that is possible in the affected area, to check for strength in your extremities, and to check for sensation in the areas of the arms and legs supplied by different nerve roots. An X-ray of the spine may be taken. Your caregiver may suggest other imaging tests, such as magnetic resonance imaging (MRI), if needed.   TREATMENT   Treatment includes rest, modifying your activities, and applying ice and heat. Your caregiver may prescribe medicines to reduce your pain and may ask you to do some exercises to strengthen your back. In some cases, you may need surgery. You and your caregiver will decide on the treatment that is best for you.  HOME CARE INSTRUCTIONS    Follow proper lifting and walking techniques as advised by your caregiver.   Maintain good posture.   Exercise regularly   as advised.   Perform relaxation exercises.   Change your sitting, standing, and sleeping habits as advised. Change positions frequently.   Lose weight as advised.   Stop smoking if you smoke.   Wear supportive footwear.  SEEK MEDICAL CARE IF:   Your pain does not go away within 1 to 4 weeks.  SEEK IMMEDIATE MEDICAL CARE IF:    Your pain is severe.   You notice weakness in your arms, hands, or legs.   You begin to lose control of your bladder or bowel movements.  MAKE SURE YOU:    Understand these instructions.   Will watch your condition.   Will get help right away if you are not doing  well or get worse.  Document Released: 04/21/2007 Document Revised: 09/16/2011 Document Reviewed: 10/26/2013  Exit[employer] Patient Information 2015 [employer]. This information is not intended to replace advice given to you by your health care provider. Make sure you discuss any questions you have with your health care provider.            Sciatica  Sciatica is pain, weakness, numbness, or tingling along the path of the sciatic nerve. The nerve starts in the lower back and runs down the back of each leg. The nerve controls the muscles in the lower leg and in the back of the knee, while also providing sensation to the back of the thigh, lower leg, and the sole of your foot. Sciatica is a symptom of another medical condition. For instance, nerve damage or certain conditions, such as a herniated disk or bone spur on the spine, pinch or put pressure on the sciatic nerve. This causes the pain, weakness, or other sensations normally associated with sciatica. Generally, sciatica only affects one side of the body.  CAUSES    Herniated or slipped disc.   Degenerative disk disease.   A pain disorder involving the narrow muscle in the buttocks (piriformis syndrome).   Pelvic injury or fracture.   Pregnancy.   Tumor (rare).  SYMPTOMS   Symptoms can vary from mild to very severe. The symptoms usually travel from the low back to the buttocks and down the back of the leg. Symptoms can include:   Mild tingling or dull aches in the lower back, leg, or hip.   Numbness in the back of the calf or sole of the foot.   Burning sensations in the lower back, leg, or hip.   Sharp pains in the lower back, leg, or hip.   Leg weakness.   Severe back pain inhibiting movement.  These symptoms may get worse with coughing, sneezing, laughing, or prolonged sitting or standing. Also, being overweight may worsen symptoms.  DIAGNOSIS   Your caregiver will perform a physical exam to look for common symptoms of sciatica. He or she may ask  you to do certain movements or activities that would trigger sciatic nerve pain. Other tests may be performed to find the cause of the sciatica. These may include:   Blood tests.   X-rays.   Imaging tests, such as an MRI or CT scan.  TREATMENT   Treatment is directed at the cause of the sciatic pain. Sometimes, treatment is not necessary and the pain and discomfort goes away on its own. If treatment is needed, your caregiver may suggest:   Over-the-counter medicines to relieve pain.   Prescription medicines, such as anti-inflammatory medicine, muscle relaxants, or narcotics.   Applying heat or ice to the painful area.   Steroid injections   to lessen pain, irritation, and inflammation around the nerve.   Reducing activity during periods of pain.   Exercising and stretching to strengthen your abdomen and improve flexibility of your spine. Your caregiver may suggest losing weight if the extra weight makes the back pain worse.   Physical therapy.   Surgery to eliminate what is pressing or pinching the nerve, such as a bone spur or part of a herniated disk.  HOME CARE INSTRUCTIONS    Only take over-the-counter or prescription medicines for pain or discomfort as directed by your caregiver.   Apply ice to the affected area for 20 minutes, 3-4 times a day for the first 48-72 hours. Then try heat in the same way.   Exercise, stretch, or perform your usual activities if these do not aggravate your pain.   Attend physical therapy sessions as directed by your caregiver.   Keep all follow-up appointments as directed by your caregiver.   Do not wear high heels or shoes that do not provide proper support.   Check your mattress to see if it is too soft. A firm mattress may lessen your pain and discomfort.  SEEK IMMEDIATE MEDICAL CARE IF:    You lose control of your bowel or bladder (incontinence).   You have increasing weakness in the lower back, pelvis, buttocks, or legs.   You have redness or swelling of your  back.   You have a burning sensation when you urinate.   You have pain that gets worse when you lie down or awakens you at night.   Your pain is worse than you have experienced in the past.   Your pain is lasting longer than 4 weeks.   You are suddenly losing weight without reason.  MAKE SURE YOU:   Understand these instructions.   Will watch your condition.   Will get help right away if you are not doing well or get worse.  Document Released: 06/18/2001 Document Revised: 12/24/2011 Document Reviewed: 11/03/2011  [employer] Patient Information 2015 [employer]. This information is not intended to replace advice given to you by your health care provider. Make sure you discuss any questions you have with your health care provider.

## 2014-02-27 NOTE — ED Notes (Signed)
Patient was assisted out of the car by 2 staff members moaning out loud with lower back  Patient was difficult to get into a chair

## 2014-02-28 NOTE — ED Provider Notes (Signed)
Medical screening examination/treatment/procedure(s) were performed by non-physician practitioner and as supervising physician I was immediately available for consultation/collaboration.   EKG Interpretation None        Enid Skeens, MD 02/28/14 [phone]

## 2014-03-06 ENCOUNTER — Encounter (HOSPITAL_COMMUNITY): Payer: Self-pay | Admitting: Emergency Medicine

## 2014-03-06 ENCOUNTER — Emergency Department (HOSPITAL_COMMUNITY)
Admission: EM | Admit: 2014-03-06 | Discharge: 2014-03-06 | Disposition: A | Discharge status: Home / Self Care | Payer: Self-pay | Attending: Emergency Medicine | Admitting: Emergency Medicine

## 2014-03-06 DIAGNOSIS — M545 Low back pain, unspecified: Secondary | ICD-10-CM | POA: Insufficient documentation

## 2014-03-06 DIAGNOSIS — Z7982 Long term (current) use of aspirin: Secondary | ICD-10-CM | POA: Insufficient documentation

## 2014-03-06 DIAGNOSIS — M5441 Lumbago with sciatica, right side: Secondary | ICD-10-CM

## 2014-03-06 DIAGNOSIS — Z79899 Other long term (current) drug therapy: Secondary | ICD-10-CM | POA: Insufficient documentation

## 2014-03-06 DIAGNOSIS — I10 Essential (primary) hypertension: Secondary | ICD-10-CM | POA: Insufficient documentation

## 2014-03-06 DIAGNOSIS — M5430 Sciatica, unspecified side: Secondary | ICD-10-CM | POA: Insufficient documentation

## 2014-03-06 MED ORDER — HYDROCODONE-ACETAMINOPHEN 5-325 MG PO TABS
2.0000 | ORAL_TABLET | Freq: Once | ORAL | Status: AC
  Administered 2014-03-06: 2 via ORAL
  Filled 2014-03-06: qty 2

## 2014-03-06 MED ORDER — HYDROCODONE-ACETAMINOPHEN 5-325 MG PO TABS: 2.0000 | ORAL_TABLET | ORAL | Status: DC | PRN

## 2014-03-06 MED ORDER — DIAZEPAM 5 MG PO TABS
5.0000 mg | ORAL_TABLET | Freq: Once | ORAL | Status: AC
  Administered 2014-03-06: 5 mg via ORAL
  Filled 2014-03-06: qty 1

## 2014-03-06 NOTE — ED Notes (Signed)
Pt given discharge instructions, prescriptions reviewed. Pt encouraged to follow-up with referral. Pt discharged home in wheelchair with family.

## 2014-03-06 NOTE — ED Provider Notes (Signed)
Medical screening examination/treatment/procedure(s) were performed by non-physician practitioner and as supervising physician I was immediately available for consultation/collaboration.   EKG Interpretation None        Gilda Crease, MD 03/06/14 [phone]

## 2014-03-06 NOTE — Discharge Instructions (Signed)
Sciatica Sciatica is pain, weakness, numbness, or tingling along the path of the sciatic nerve. The nerve starts in the lower back and runs down the back of each leg. The nerve controls the muscles in the lower leg and in the back of the knee, while also providing sensation to the back of the thigh, lower leg, and the sole of your foot. Sciatica is a symptom of another medical condition. For instance, nerve damage or certain conditions, such as a herniated disk or bone spur on the spine, pinch or put pressure on the sciatic nerve. This causes the pain, weakness, or other sensations normally associated with sciatica. Generally, sciatica only affects one side of the body. CAUSES   Herniated or slipped disc.  Degenerative disk disease.  A pain disorder involving the narrow muscle in the buttocks (piriformis syndrome).  Pelvic injury or fracture.  Pregnancy.  Tumor (rare). SYMPTOMS  Symptoms can vary from mild to very severe. The symptoms usually travel from the low back to the buttocks and down the back of the leg. Symptoms can include:  Mild tingling or dull aches in the lower back, leg, or hip.  Numbness in the back of the calf or sole of the foot.  Burning sensations in the lower back, leg, or hip.  Sharp pains in the lower back, leg, or hip.  Leg weakness.  Severe back pain inhibiting movement. These symptoms may get worse with coughing, sneezing, laughing, or prolonged sitting or standing. Also, being overweight may worsen symptoms. DIAGNOSIS  Your caregiver will perform a physical exam to look for common symptoms of sciatica. He or she may ask you to do certain movements or activities that would trigger sciatic nerve pain. Other tests may be performed to find the cause of the sciatica. These may include:  Blood tests.  X-rays.  Imaging tests, such as an MRI or CT scan. TREATMENT  Treatment is directed at the cause of the sciatic pain. Sometimes, treatment is not necessary  and the pain and discomfort goes away on its own. If treatment is needed, your caregiver may suggest:  Over-the-counter medicines to relieve pain.  Prescription medicines, such as anti-inflammatory medicine, muscle relaxants, or narcotics.  Applying heat or ice to the painful area.  Steroid injections to lessen pain, irritation, and inflammation around the nerve.  Reducing activity during periods of pain.  Exercising and stretching to strengthen your abdomen and improve flexibility of your spine. Your caregiver may suggest losing weight if the extra weight makes the back pain worse.  Physical therapy.  Surgery to eliminate what is pressing or pinching the nerve, such as a bone spur or part of a herniated disk. HOME CARE INSTRUCTIONS   Only take over-the-counter or prescription medicines for pain or discomfort as directed by your caregiver.  Apply ice to the affected area for 20 minutes, 3-4 times a day for the first 48-72 hours. Then try heat in the same way.  Exercise, stretch, or perform your usual activities if these do not aggravate your pain.  Attend physical therapy sessions as directed by your caregiver.  Keep all follow-up appointments as directed by your caregiver.  Do not wear high heels or shoes that do not provide proper support.  Check your mattress to see if it is too soft. A firm mattress may lessen your pain and discomfort. SEEK IMMEDIATE MEDICAL CARE IF:   You lose control of your bowel or bladder (incontinence).  You have increasing weakness in the lower back, pelvis, buttocks,   or legs.  You have redness or swelling of your back.  You have a burning sensation when you urinate.  You have pain that gets worse when you lie down or awakens you at night.  Your pain is worse than you have experienced in the past.  Your pain is lasting longer than 4 weeks.  You are suddenly losing weight without reason. MAKE SURE YOU:  Understand these  instructions.  Will watch your condition.  Will get help right away if you are not doing well or get worse. Document Released: 06/18/2001 Document Revised: 12/24/2011 Document Reviewed: 11/03/2011 [employer] Patient Information 2015 [employer]. This information is not intended to replace advice given to you by your health care provider. Make sure you discuss any questions you have with your health care provider.  

## 2014-03-06 NOTE — ED Provider Notes (Signed)
CSN: [id number]     Arrival date & time 03/06/14  2042 History   First MD Initiated Contact with Patient 03/06/14 2043     Chief Complaint  Patient presents with  . Back Pain     (Consider location/radiation/quality/duration/timing/severity/associated sxs/prior Treatment) HPI Comments: Patient presents to the emergency department with chief complaint of low back pain. He states the symptoms have been ongoing for the past week or so. States that he was improved after being treated here with Percocet. States that tonight when he got into his car, the pain worsened. States the pain is mostly in the right side of his low back. He reports associated radiating symptoms to his right buttock and the back of his leg. States the pain is 10 out of 10. States that he has run out of his pain medication, but didn't take it as prescribed. He has not followed up with orthopedics. The pain is aggravated with movement and palpation. He denies fevers, chills, chest pain, shortness of breath. Denies any bowel or bladder incontinence. Denies any saddle anesthesia.  The history is provided by the patient. No language interpreter was used.    Past Medical History  Diagnosis Date  . Hypertension    History reviewed. No pertinent past surgical history. Family History  Problem Relation Age of Onset  . Hyperlipidemia Father    History  Substance Use Topics  . Smoking status: Never Smoker   . Smokeless tobacco: Not on file  . Alcohol Use: No    Review of Systems  Constitutional: Negative for fever and chills.  Respiratory: Negative for shortness of breath.   Cardiovascular: Negative for chest pain.  Gastrointestinal: Negative for nausea, vomiting, diarrhea and constipation.       No bowel incontinence  Genitourinary: Negative for dysuria.       No urinary incontinence  Musculoskeletal: Positive for arthralgias, back pain and myalgias.  Neurological:       No saddle anesthesia      Allergies   Review of patient's allergies indicates no known allergies.  Home Medications   Prior to Admission medications   Medication Sig Start Date End Date Taking? Authorizing Provider  amLODipine (NORVASC) 5 MG tablet Take 5 mg by mouth daily.    Historical Provider, MD  ASPIRIN PO Take 1 tablet by mouth daily.    Historical Provider, MD  methocarbamol (ROBAXIN) 500 MG tablet Take 1 tablet (500 mg total) by mouth 2 (two) times daily. 02/27/14   Elson Areas, PA-C  oxyCODONE-acetaminophen (PERCOCET/ROXICET) 5-325 MG per tablet One -two tablets q 4-6 prn pain 02/27/14   Elson Areas, PA-C  predniSONE (DELTASONE) 10 MG tablet 6,5,4,3,2,1 taper 02/27/14   Elson Areas, PA-C   BP 147/89  Pulse 99  Temp(Src) 98.4 F (36.9 C) (Oral)  Resp 31  SpO2 95% Physical Exam  Nursing note and vitals reviewed. Constitutional: He is oriented to person, place, and time. He appears well-developed and well-nourished. No distress.  HENT:  Head: Normocephalic and atraumatic.  Eyes: Conjunctivae and EOM are normal. Right eye exhibits no discharge. Left eye exhibits no discharge. No scleral icterus.  Neck: Normal range of motion. Neck supple. No tracheal deviation present.  Cardiovascular: Normal rate, regular rhythm and normal heart sounds.  Exam reveals no gallop and no friction rub.   No murmur heard. Pulmonary/Chest: Effort normal and breath sounds normal. No respiratory distress. He has no wheezes.  Abdominal: Soft. He exhibits no distension. There is no tenderness.  Musculoskeletal:  Normal range of motion.  Right-sided lumbar paraspinal muscles tender to palpation, no bony tenderness, step-offs, or gross abnormality or deformity of spine, patient is able to ambulate, moves all extremities  Bilateral great toe extension intact Bilateral plantar/dorsiflexion intact  Neurological: He is alert and oriented to person, place, and time. He has normal reflexes.  Sensation and strength intact  bilaterally Symmetrical reflexes  Skin: Skin is warm. He is not diaphoretic.  Psychiatric: He has a normal mood and affect. His behavior is normal. Judgment and thought content normal.    ED Course  Procedures (including critical care time) Labs Review Labs Reviewed - No data to display  Imaging Review No results found.   EKG Interpretation None      MDM   Final diagnoses:  Right-sided low back pain with right-sided sciatica    Patient with back pain.  Normal plain films 6 days ago.  No neurological deficits and normal neuro exam.  Patient is ambulatory.  No loss of bowel or bladder control.  Doubt cauda equina.  Denies fever,  doubt epidural abscess or other lesion. Recommend back exercises, stretching, RICE, and will treat with a short course of norco.  Encouraged the patient that there could be a need for additional workup and/or imaging such as MRI, if the symptoms do not resolve. Patient advised that if the back pain does not resolve, or radiates, this could progress to more serious conditions and is encouraged to follow-up with PCP or orthopedics within 2 weeks.       Roxy Horseman, PA-C 03/06/14 2229

## 2014-03-06 NOTE — ED Notes (Signed)
Browning, PA at bedside 

## 2014-03-06 NOTE — ED Notes (Signed)
Patient stated he is unable to ambulate because of pain

## 2014-03-06 NOTE — ED Notes (Addendum)
Another RN was asked to help pt in parking lot.  RN found pt lying in trunk of car.  Pt c/o severe pain to R side of lower back that radiates down R leg.  Pt was taken straight to treatment room.

## 2014-03-08 ENCOUNTER — Encounter (HOSPITAL_COMMUNITY): Payer: Self-pay | Admitting: Emergency Medicine

## 2014-03-08 ENCOUNTER — Emergency Department (HOSPITAL_COMMUNITY)
Admission: EM | Admit: 2014-03-08 | Discharge: 2014-03-08 | Disposition: A | Discharge status: Home / Self Care | Payer: Self-pay | Attending: Emergency Medicine | Admitting: Emergency Medicine

## 2014-03-08 DIAGNOSIS — M5441 Lumbago with sciatica, right side: Secondary | ICD-10-CM

## 2014-03-08 DIAGNOSIS — I10 Essential (primary) hypertension: Secondary | ICD-10-CM | POA: Insufficient documentation

## 2014-03-08 DIAGNOSIS — M5430 Sciatica, unspecified side: Secondary | ICD-10-CM | POA: Insufficient documentation

## 2014-03-08 DIAGNOSIS — M549 Dorsalgia, unspecified: Secondary | ICD-10-CM | POA: Insufficient documentation

## 2014-03-08 MED ORDER — DIAZEPAM 5 MG PO TABS
5.0000 mg | ORAL_TABLET | Freq: Once | ORAL | Status: AC
  Administered 2014-03-08: 5 mg via ORAL
  Filled 2014-03-08: qty 1

## 2014-03-08 MED ORDER — METHOCARBAMOL 750 MG PO TABS: 750.0000 mg | ORAL_TABLET | Freq: Four times a day (QID) | ORAL | Status: DC | PRN

## 2014-03-08 MED ORDER — OXYCODONE-ACETAMINOPHEN 5-325 MG PO TABS
2.0000 | ORAL_TABLET | Freq: Once | ORAL | Status: AC
  Administered 2014-03-08: 2 via ORAL
  Filled 2014-03-08: qty 2

## 2014-03-08 MED ORDER — OXYCODONE-ACETAMINOPHEN 5-325 MG PO TABS
2.0000 | ORAL_TABLET | ORAL | Status: DC | PRN
Start: 2014-03-08 — End: 2014-03-21

## 2014-03-08 NOTE — ED Provider Notes (Signed)
CSN: [id number]     Arrival date & time 03/08/14  0203 History   First MD Initiated Contact with Patient 03/08/14 0251     Chief Complaint  Patient presents with  . Back Pain     (Consider location/radiation/quality/duration/timing/severity/associated sxs/prior Treatment) HPI 63 year old male presents to emergency department via EMS with complaint of back pain radiating down his right buttock.  This is the third visit this month for similar complaint.  Patient was seen on Sunday for same.  He has not yet followup with orthopedist as directed.  Patient initially seen on the 23rd after lifting a couch.  Patient reports that he felt better with Percocet and Robaxin and steroids.  He reports the pain returned over the weekend when he ran out of pain medications.  He reports the Vicodin does not seem to help with the pain as well.  Patient was driving himself to the hospital when the pain became too great, and he pulled over.  He denies any bowel or bladder incontinence, no weakness or numbness into the legs.  No prior back surgery.  Patient reports history of back pain over the last several months.  No fevers or chills, no weight loss, no history of cancer.  Pain mainly in right lateral lower back and buttock. Past Medical History  Diagnosis Date  . Hypertension    History reviewed. No pertinent past surgical history. No family history on file. History  Substance Use Topics  . Smoking status: Never Smoker   . Smokeless tobacco: Not on file  . Alcohol Use: No    Review of Systems   See History of Present Illness; otherwise all other systems are reviewed and negative  Allergies  Review of patient's allergies indicates no known allergies.  Home Medications   Prior to Admission medications   Medication Sig Start Date End Date Taking? Authorizing Provider  HYDROcodone-acetaminophen (NORCO/VICODIN) 5-325 MG per tablet Take 2 tablets by mouth every 6 (six) hours as needed for moderate pain.    Yes Historical Provider, MD  methocarbamol (ROBAXIN) 500 MG tablet Take 500 mg by mouth 2 (two) times daily.   Yes Historical Provider, MD   BP 120/72  Pulse 95  Temp(Src) 98.2 F (36.8 C) (Oral)  Resp 18  SpO2 100% Physical Exam  Nursing note and vitals reviewed. Constitutional: He is oriented to person, place, and time. He appears well-developed and well-nourished. He appears distressed (uncomfortable appearing).  HENT:  Head: Normocephalic and atraumatic.  Nose: Nose normal.  Mouth/Throat: Oropharynx is clear and moist.  Eyes: Conjunctivae and EOM are normal. Pupils are equal, round, and reactive to light.  Neck: Normal range of motion. Neck supple. No JVD present. No tracheal deviation present. No thyromegaly present.  Cardiovascular: Normal rate, regular rhythm, normal heart sounds and intact distal pulses.  Exam reveals no gallop and no friction rub.   No murmur heard. Pulmonary/Chest: Effort normal and breath sounds normal. No stridor. No respiratory distress. He has no wheezes. He has no rales. He exhibits no tenderness.  Abdominal: Soft. Bowel sounds are normal. He exhibits no distension and no mass. There is no tenderness. There is no rebound and no guarding.  Musculoskeletal: Normal range of motion. He exhibits tenderness (patient has pain with palpation to right paraspinal lumbar musculature and right buttock.). He exhibits no edema.  Lymphadenopathy:    He has no cervical adenopathy.  Neurological: He is alert and oriented to person, place, and time. No cranial nerve deficit. He exhibits normal muscle  tone. Coordination normal.  Patient able to lift his leg with minimal discomfort bilaterally.  Unable to elicit patellar tendon reflexes in either knee.  Patient has normal sensation normal range of motion.    Skin: Skin is warm and dry. No rash noted. No erythema. No pallor.  Psychiatric: He has a normal mood and affect. His behavior is normal. Judgment and thought content  normal.    ED Course  Procedures (including critical care time) Labs Review Labs Reviewed - No data to display  Imaging Review No results found.   EKG Interpretation None      MDM   Final diagnoses:  Right-sided low back pain with right-sided sciatica    63 year old male with persistent right low back pain with sciatic extension.  Will refill Percocet, patient instructed that he must call orthopedist for further followup treatment and management of his pain.  Olivia Mackie, MD 03/08/14 [phone]

## 2014-03-08 NOTE — ED Notes (Signed)
Per EMS, pt was on his way in via private vehicle when his back pain got to intense so he called EMS. Pt was seen here for the same yesterday. NAD at this time. VSS.

## 2014-03-08 NOTE — Discharge Instructions (Signed)
YOU MUST CONTACT ONE OF THE SPECIALISTS ABOVE FOR FURTHER WORKUP AND TREATMENT OF YOUR ONGOING BACK PAIN!!  CALL TODAY FOR FOLLOW UP APPOINTMENT.   Sciatica Sciatica is pain, weakness, numbness, or tingling along the path of the sciatic nerve. The nerve starts in the lower back and runs down the back of each leg. The nerve controls the muscles in the lower leg and in the back of the knee, while also providing sensation to the back of the thigh, lower leg, and the sole of your foot. Sciatica is a symptom of another medical condition. For instance, nerve damage or certain conditions, such as a herniated disk or bone spur on the spine, pinch or put pressure on the sciatic nerve. This causes the pain, weakness, or other sensations normally associated with sciatica. Generally, sciatica only affects one side of the body. CAUSES   Herniated or slipped disc.  Degenerative disk disease.  A pain disorder involving the narrow muscle in the buttocks (piriformis syndrome).  Pelvic injury or fracture.  Pregnancy.  Tumor (rare). SYMPTOMS  Symptoms can vary from mild to very severe. The symptoms usually travel from the low back to the buttocks and down the back of the leg. Symptoms can include:  Mild tingling or dull aches in the lower back, leg, or hip.  Numbness in the back of the calf or sole of the foot.  Burning sensations in the lower back, leg, or hip.  Sharp pains in the lower back, leg, or hip.  Leg weakness.  Severe back pain inhibiting movement. These symptoms may get worse with coughing, sneezing, laughing, or prolonged sitting or standing. Also, being overweight may worsen symptoms. DIAGNOSIS  Your caregiver will perform a physical exam to look for common symptoms of sciatica. He or she may ask you to do certain movements or activities that would trigger sciatic nerve pain. Other tests may be performed to find the cause of the sciatica. These may include:  Blood  tests.  X-rays.  Imaging tests, such as an MRI or CT scan. TREATMENT  Treatment is directed at the cause of the sciatic pain. Sometimes, treatment is not necessary and the pain and discomfort goes away on its own. If treatment is needed, your caregiver may suggest:  Over-the-counter medicines to relieve pain.  Prescription medicines, such as anti-inflammatory medicine, muscle relaxants, or narcotics.  Applying heat or ice to the painful area.  Steroid injections to lessen pain, irritation, and inflammation around the nerve.  Reducing activity during periods of pain.  Exercising and stretching to strengthen your abdomen and improve flexibility of your spine. Your caregiver may suggest losing weight if the extra weight makes the back pain worse.  Physical therapy.  Surgery to eliminate what is pressing or pinching the nerve, such as a bone spur or part of a herniated disk. HOME CARE INSTRUCTIONS   Only take over-the-counter or prescription medicines for pain or discomfort as directed by your caregiver.  Apply ice to the affected area for 20 minutes, 3-4 times a day for the first 48-72 hours. Then try heat in the same way.  Exercise, stretch, or perform your usual activities if these do not aggravate your pain.  Attend physical therapy sessions as directed by your caregiver.  Keep all follow-up appointments as directed by your caregiver.  Do not wear high heels or shoes that do not provide proper support.  Check your mattress to see if it is too soft. A firm mattress may lessen your pain and discomfort.  SEEK IMMEDIATE MEDICAL CARE IF:   You lose control of your bowel or bladder (incontinence).  You have increasing weakness in the lower back, pelvis, buttocks, or legs.  You have redness or swelling of your back.  You have a burning sensation when you urinate.  You have pain that gets worse when you lie down or awakens you at night.  Your pain is worse than you have  experienced in the past.  Your pain is lasting longer than 4 weeks.  You are suddenly losing weight without reason. MAKE SURE YOU:  Understand these instructions.  Will watch your condition.  Will get help right away if you are not doing well or get worse. Document Released: 06/18/2001 Document Revised: 12/24/2011 Document Reviewed: 11/03/2011 [facility] Patient Information 2015 [place], [place]. This information is not intended to replace advice given to you by your health care provider. Make sure you discuss any questions you have with your health care provider.  Back Pain, Adult Back pain is very common. The pain often gets better over time. The cause of back pain is usually not dangerous. Most people can learn to manage their back pain on their own.  HOME CARE   Stay active. Start with short walks on flat ground if you can. Try to walk farther each day.  Do not sit, drive, or stand in one place for more than 30 minutes. Do not stay in bed.  Do not avoid exercise or work. Activity can help your back heal faster.  Be careful when you bend or lift an object. Bend at your knees, keep the object close to you, and do not twist.  Sleep on a firm mattress. Lie on your side, and bend your knees. If you lie on your back, put a pillow under your knees.  Only take medicines as told by your doctor.  Put ice on the injured area.  Put ice in a plastic bag.  Place a towel between your skin and the bag.  Leave the ice on for 15-20 minutes, 03-04 times a day for the first 2 to 3 days. After that, you can switch between ice and heat packs.  Ask your doctor about back exercises or massage.  Avoid feeling anxious or stressed. Find good ways to deal with stress, such as exercise. GET HELP RIGHT AWAY IF:   Your pain does not go away with rest or medicine.  Your pain does not go away in 1 week.  You have new problems.  You do not feel well.  The pain spreads into your legs.  You cannot  control when you poop (bowel movement) or pee (urinate).  Your arms or legs feel weak or lose feeling (numbness).  You feel sick to your stomach (nauseous) or throw up (vomit).  You have belly (abdominal) pain.  You feel like you may pass out (faint). MAKE SURE YOU:   Understand these instructions.  Will watch your condition.  Will get help right away if you are not doing well or get worse. Document Released: 12/11/2007 Document Revised: 09/16/2011 Document Reviewed: 10/26/2013 [facility] Patient Information 2015 [place], [place]. This information is not intended to replace advice given to you by your health care provider. Make sure you discuss any questions you have with your health care provider.

## 2014-03-13 ENCOUNTER — Emergency Department (HOSPITAL_COMMUNITY): Payer: Self-pay

## 2014-03-13 ENCOUNTER — Emergency Department (HOSPITAL_COMMUNITY)
Admission: EM | Admit: 2014-03-13 | Discharge: 2014-03-13 | Disposition: A | Discharge status: Home / Self Care | Payer: Self-pay | Attending: Emergency Medicine | Admitting: Emergency Medicine

## 2014-03-13 ENCOUNTER — Encounter (HOSPITAL_COMMUNITY): Payer: Self-pay | Admitting: Emergency Medicine

## 2014-03-13 DIAGNOSIS — M5441 Lumbago with sciatica, right side: Secondary | ICD-10-CM

## 2014-03-13 DIAGNOSIS — M545 Low back pain, unspecified: Secondary | ICD-10-CM | POA: Insufficient documentation

## 2014-03-13 DIAGNOSIS — Z791 Long term (current) use of non-steroidal anti-inflammatories (NSAID): Secondary | ICD-10-CM | POA: Insufficient documentation

## 2014-03-13 DIAGNOSIS — Z7982 Long term (current) use of aspirin: Secondary | ICD-10-CM | POA: Insufficient documentation

## 2014-03-13 DIAGNOSIS — I10 Essential (primary) hypertension: Secondary | ICD-10-CM | POA: Insufficient documentation

## 2014-03-13 MED ORDER — NAPROXEN 500 MG PO TABS: 500.0000 mg | ORAL_TABLET | Freq: Two times a day (BID) | ORAL | Status: DC

## 2014-03-13 MED ORDER — METHOCARBAMOL 500 MG PO TABS: 500.0000 mg | ORAL_TABLET | Freq: Three times a day (TID) | ORAL | Status: DC | PRN

## 2014-03-13 MED ORDER — OXYCODONE-ACETAMINOPHEN 5-325 MG PO TABS
2.0000 | ORAL_TABLET | Freq: Once | ORAL | Status: AC
  Administered 2014-03-13: 2 via ORAL
  Filled 2014-03-13: qty 2

## 2014-03-13 MED ORDER — KETOROLAC TROMETHAMINE 60 MG/2ML IM SOLN
60.0000 mg | Freq: Once | INTRAMUSCULAR | Status: AC
  Administered 2014-03-13: 60 mg via INTRAMUSCULAR
  Filled 2014-03-13: qty 2

## 2014-03-13 NOTE — ED Notes (Addendum)
Dr. Patria Mane at bedside.  Pt states the pain in the lower back is intermittent that has been ongoing x 15 days.  Denies N/V/D.

## 2014-03-13 NOTE — Discharge Instructions (Signed)
Back Pain, Adult Low back pain is very common. About 1 in 5 people have back pain.The cause of low back pain is rarely dangerous. The pain often gets better over time.About half of people with a sudden onset of back pain feel better in just 2 weeks. About 8 in 10 people feel better by 6 weeks.  CAUSES Some common causes of back pain include:  Strain of the muscles or ligaments supporting the spine.  Wear and tear (degeneration) of the spinal discs.  Arthritis.  Direct injury to the back. DIAGNOSIS Most of the time, the direct cause of low back pain is not known.However, back pain can be treated effectively even when the exact cause of the pain is unknown.Answering your caregiver's questions about your overall health and symptoms is one of the most accurate ways to make sure the cause of your pain is not dangerous. If your caregiver needs more information, he or she may order lab work or imaging tests (X-rays or MRIs).However, even if imaging tests show changes in your back, this usually does not require surgery. HOME CARE INSTRUCTIONS For many people, back pain returns.Since low back pain is rarely dangerous, it is often a condition that people can learn to manageon their own.   Remain active. It is stressful on the back to sit or stand in one place. Do not sit, drive, or stand in one place for more than 30 minutes at a time. Take short walks on level surfaces as soon as pain allows.Try to increase the length of time you walk each day.  Do not stay in bed.Resting more than 1 or 2 days can delay your recovery.  Do not avoid exercise or work.Your body is made to move.It is not dangerous to be active, even though your back may hurt.Your back will likely heal faster if you return to being active before your pain is gone.  Pay attention to your body when you bend and lift. Many people have less discomfortwhen lifting if they bend their knees, keep the load close to their bodies,and  avoid twisting. Often, the most comfortable positions are those that put less stress on your recovering back.  Find a comfortable position to sleep. Use a firm mattress and lie on your side with your knees slightly bent. If you lie on your back, put a pillow under your knees.  Only take over-the-counter or prescription medicines as directed by your caregiver. Over-the-counter medicines to reduce pain and inflammation are often the most helpful.Your caregiver may prescribe muscle relaxant drugs.These medicines help dull your pain so you can more quickly return to your normal activities and healthy exercise.  Put ice on the injured area.  Put ice in a plastic bag.  Place a towel between your skin and the bag.  Leave the ice on for 15-20 minutes, 03-04 times a day for the first 2 to 3 days. After that, ice and heat may be alternated to reduce pain and spasms.  Ask your caregiver about trying back exercises and gentle massage. This may be of some benefit.  Avoid feeling anxious or stressed.Stress increases muscle tension and can worsen back pain.It is important to recognize when you are anxious or stressed and learn ways to manage it.Exercise is a great option. SEEK MEDICAL CARE IF:  You have pain that is not relieved with rest or medicine.  You have pain that does not improve in 1 week.  You have new symptoms.  You are generally not feeling well. SEEK   IMMEDIATE MEDICAL CARE IF:   You have pain that radiates from your back into your legs.  You develop new bowel or bladder control problems.  You have unusual weakness or numbness in your arms or legs.  You develop nausea or vomiting.  You develop abdominal pain.  You feel faint. Document Released: 06/24/2005 Document Revised: 12/24/2011 Document Reviewed: 10/26/2013 [employer] Patient Information 2015 [employer]. This information is not intended to replace advice given to you by your health care provider. Make sure you  discuss any questions you have with your health care provider.  

## 2014-03-13 NOTE — ED Provider Notes (Signed)
CSN: [id number]     Arrival date & time 03/13/14  0831 History   First MD Initiated Contact with Patient 03/13/14 [phone]     Chief Complaint  Patient presents with  . Shortness of Breath  . Back Pain      HPI Patient presents with severe low back pain.  His pain is been waxing and waning over the past several weeks.  His pain worsened today.  He states his pain is severe enough that is making him feel short of breath.  He was not having shortness of breath prior to the severity of his pain.  His pain is located in his right low back.  It radiates down to his right buttock.  Denies any weakness in his arms or legs.  He denies abdominal pain.  No urinary complaints.  Denies nausea vomiting diarrhea.  No productive cough.  No history of pulmonary embolism.  States it is not truly short of breath only that his pain is severe at this time and taking a deep breath hurts his pain in his right low back more.   Past Medical History  Diagnosis Date  . Hypertension    History reviewed. No pertinent past surgical history. Family History  Problem Relation Age of Onset  . Hyperlipidemia Father    History  Substance Use Topics  . Smoking status: Never Smoker   . Smokeless tobacco: Not on file  . Alcohol Use: No    Review of Systems  All other systems reviewed and are negative.     Allergies  Review of patient's allergies indicates no known allergies.  Home Medications   Prior to Admission medications   Medication Sig Start Date End Date Taking? Authorizing Provider  amLODipine (NORVASC) 5 MG tablet Take 5 mg by mouth daily.   Yes Historical Provider, MD  ASPIRIN PO Take 1 tablet by mouth daily.   Yes Historical Provider, MD  methocarbamol (ROBAXIN-750) 750 MG tablet Take 1 tablet (750 mg total) by mouth every 6 (six) hours as needed for muscle spasms. 03/08/14  Yes Olivia Mackie, MD  oxyCODONE-acetaminophen (PERCOCET/ROXICET) 5-325 MG per tablet Take 2 tablets by mouth every 4 (four) hours  as needed for severe pain. 03/08/14  Yes Olivia Mackie, MD  methocarbamol (ROBAXIN) 500 MG tablet Take 1 tablet (500 mg total) by mouth every 8 (eight) hours as needed for muscle spasms. 03/13/14   Lyanne Co, MD  naproxen (NAPROSYN) 500 MG tablet Take 1 tablet (500 mg total) by mouth 2 (two) times daily. 03/13/14   Lyanne Co, MD   BP 119/69  Pulse 71  Temp(Src) 97.5 F (36.4 C) (Oral)  Resp 9  SpO2 99% Physical Exam  Nursing note and vitals reviewed. Constitutional: He is oriented to person, place, and time. He appears well-developed and well-nourished.  HENT:  Head: Normocephalic and atraumatic.  Eyes: EOM are normal.  Neck: Normal range of motion.  Cardiovascular: Normal rate, regular rhythm, normal heart sounds and intact distal pulses.   Pulmonary/Chest: Effort normal and breath sounds normal. No respiratory distress.  Abdominal: Soft. He exhibits no distension. There is no tenderness.  Musculoskeletal: Normal range of motion.  Tenderness with mild spasm of his right paralumbar spinal muscles.  No lumbar point tenderness or step-off.  Some tenderness in his right sciatic groove.  No right low back rash noted.  Full strength n his lower extremity major muscle groups.  Neurological: He is alert and oriented to person, place, and time.  Skin: Skin is warm and dry.  Psychiatric: He has a normal mood and affect. Judgment normal.    ED Course  Procedures (including critical care time) Labs Review Labs Reviewed - No data to display  Imaging Review Dg Chest 2 View  03/13/2014   CLINICAL DATA:  Shortness of breath, back pain  EXAM: CHEST  2 VIEW  COMPARISON:  01/29/2012; 01/28/2012; 01/06/2012  FINDINGS: Grossly unchanged cardiac silhouette and mediastinal contours with atherosclerotic plaque within the thoracic aorta. Improved aeration of the lungs. No focal airspace opacities. No pleural effusion or pneumothorax. No evidence of edema. No acute osseus abnormalities.  IMPRESSION: No  acute cardiopulmonary disease.   Electronically Signed   By: Simonne Come M.D.   On: 03/13/2014 09:56  I personally reviewed the imaging tests through PACS system I reviewed available ER/hospitalization records through the EMR    EKG Interpretation None      MDM   Final diagnoses:  Right-sided low back pain with right-sided sciatica    Pain improved in the emergency department.  His symptoms seem to be more related to right paralumbar pain and spasm with some associated sciatica symptoms.  No history of cancer.  He's had x-rays done within the past 2 weeks which demonstrate no abnormality.  No fevers or chills.  Doubt cauda equina.  Doubt spinal epidural abscess.  Patient will need to followup with primary care physician.  He's been referred to [facility]    Lyanne Co, MD 03/13/14 1120

## 2014-03-13 NOTE — ED Notes (Signed)
Pt presented to triage with resp distress, reported sob and requesting oxygen. spo2 100% and pt very vague when answering questions, only complains of lower back pain which he was seen for on 9/1. ekg done at triage.

## 2014-03-13 NOTE — ED Notes (Signed)
Pt alert and oriented at discharge.  Pt advised not to drive home but pt insisted on driving self.  Pt walked to the waiting room with steady ambulation by this RN.  Pt verbalized understanding of discharge instructions and the need for follow up care.

## 2014-03-21 ENCOUNTER — Emergency Department (HOSPITAL_COMMUNITY)
Admission: EM | Admit: 2014-03-21 | Discharge: 2014-03-21 | Disposition: A | Discharge status: Home / Self Care | Payer: Self-pay | Attending: Emergency Medicine | Admitting: Emergency Medicine

## 2014-03-21 ENCOUNTER — Encounter (HOSPITAL_COMMUNITY): Payer: Self-pay | Admitting: Emergency Medicine

## 2014-03-21 DIAGNOSIS — Z791 Long term (current) use of non-steroidal anti-inflammatories (NSAID): Secondary | ICD-10-CM | POA: Insufficient documentation

## 2014-03-21 DIAGNOSIS — M545 Low back pain, unspecified: Secondary | ICD-10-CM | POA: Insufficient documentation

## 2014-03-21 DIAGNOSIS — M5431 Sciatica, right side: Secondary | ICD-10-CM

## 2014-03-21 DIAGNOSIS — I10 Essential (primary) hypertension: Secondary | ICD-10-CM | POA: Insufficient documentation

## 2014-03-21 DIAGNOSIS — Z79899 Other long term (current) drug therapy: Secondary | ICD-10-CM | POA: Insufficient documentation

## 2014-03-21 DIAGNOSIS — M5430 Sciatica, unspecified side: Secondary | ICD-10-CM | POA: Insufficient documentation

## 2014-03-21 MED ORDER — HYDROMORPHONE HCL PF 1 MG/ML IJ SOLN
1.0000 mg | Freq: Once | INTRAMUSCULAR | Status: AC
  Administered 2014-03-21: 1 mg via INTRAVENOUS
  Filled 2014-03-21: qty 1

## 2014-03-21 MED ORDER — GABAPENTIN 300 MG PO CAPS
300.0000 mg | ORAL_CAPSULE | Freq: Once | ORAL | Status: AC
  Administered 2014-03-21: 300 mg via ORAL
  Filled 2014-03-21: qty 1

## 2014-03-21 MED ORDER — GABAPENTIN 300 MG PO CAPS: 300.0000 mg | ORAL_CAPSULE | Freq: Two times a day (BID) | ORAL | Status: DC

## 2014-03-21 NOTE — Discharge Instructions (Signed)
You had been evaluated again for back pain.  It is very important that he followup with the orthopedic physician. You may need an outpatient MRI and/or physical therapy. Gabapentin will be added to your pain regimen. You can continue your other medications at home as directed.  Sciatica Sciatica is pain, weakness, numbness, or tingling along the path of the sciatic nerve. The nerve starts in the lower back and runs down the back of each leg. The nerve controls the muscles in the lower leg and in the back of the knee, while also providing sensation to the back of the thigh, lower leg, and the sole of your foot. Sciatica is a symptom of another medical condition. For instance, nerve damage or certain conditions, such as a herniated disk or bone spur on the spine, pinch or put pressure on the sciatic nerve. This causes the pain, weakness, or other sensations normally associated with sciatica. Generally, sciatica only affects one side of the body. CAUSES   Herniated or slipped disc.  Degenerative disk disease.  A pain disorder involving the narrow muscle in the buttocks (piriformis syndrome).  Pelvic injury or fracture.  Pregnancy.  Tumor (rare). SYMPTOMS  Symptoms can vary from mild to very severe. The symptoms usually travel from the low back to the buttocks and down the back of the leg. Symptoms can include:  Mild tingling or dull aches in the lower back, leg, or hip.  Numbness in the back of the calf or sole of the foot.  Burning sensations in the lower back, leg, or hip.  Sharp pains in the lower back, leg, or hip.  Leg weakness.  Severe back pain inhibiting movement. These symptoms may get worse with coughing, sneezing, laughing, or prolonged sitting or standing. Also, being overweight may worsen symptoms. DIAGNOSIS  Your caregiver will perform a physical exam to look for common symptoms of sciatica. He or she may ask you to do certain movements or activities that would trigger  sciatic nerve pain. Other tests may be performed to find the cause of the sciatica. These may include:  Blood tests.  X-rays.  Imaging tests, such as an MRI or CT scan. TREATMENT  Treatment is directed at the cause of the sciatic pain. Sometimes, treatment is not necessary and the pain and discomfort goes away on its own. If treatment is needed, your caregiver may suggest:  Over-the-counter medicines to relieve pain.  Prescription medicines, such as anti-inflammatory medicine, muscle relaxants, or narcotics.  Applying heat or ice to the painful area.  Steroid injections to lessen pain, irritation, and inflammation around the nerve.  Reducing activity during periods of pain.  Exercising and stretching to strengthen your abdomen and improve flexibility of your spine. Your caregiver may suggest losing weight if the extra weight makes the back pain worse.  Physical therapy.  Surgery to eliminate what is pressing or pinching the nerve, such as a bone spur or part of a herniated disk. HOME CARE INSTRUCTIONS   Only take over-the-counter or prescription medicines for pain or discomfort as directed by your caregiver.  Apply ice to the affected area for 20 minutes, 3-4 times a day for the first 48-72 hours. Then try heat in the same way.  Exercise, stretch, or perform your usual activities if these do not aggravate your pain.  Attend physical therapy sessions as directed by your caregiver.  Keep all follow-up appointments as directed by your caregiver.  Do not wear high heels or shoes that do not provide proper  support.  Check your mattress to see if it is too soft. A firm mattress may lessen your pain and discomfort. SEEK IMMEDIATE MEDICAL CARE IF:   You lose control of your bowel or bladder (incontinence).  You have increasing weakness in the lower back, pelvis, buttocks, or legs.  You have redness or swelling of your back.  You have a burning sensation when you  urinate.  You have pain that gets worse when you lie down or awakens you at night.  Your pain is worse than you have experienced in the past.  Your pain is lasting longer than 4 weeks.  You are suddenly losing weight without reason. MAKE SURE YOU:  Understand these instructions.  Will watch your condition.  Will get help right away if you are not doing well or get worse. Document Released: 06/18/2001 Document Revised: 12/24/2011 Document Reviewed: 11/03/2011 [facility] Patient Information 2015 [place], [place]. This information is not intended to replace advice given to you by your health care provider. Make sure you discuss any questions you have with your health care provider.

## 2014-03-21 NOTE — ED Notes (Signed)
The pt reports that he injured his lower back 15 days ago and he has been seen here and  Given meds but the meds are not helping his pain

## 2014-03-21 NOTE — ED Provider Notes (Signed)
CSN: [id number]     Arrival date & time 03/21/14  0132 History   First MD Initiated Contact with Patient 03/21/14 0230     Chief Complaint  Patient presents with  . Back Pain     (Consider location/radiation/quality/duration/timing/severity/associated sxs/prior Treatment) HPI  This is a 63 year old male who presents with back pain.  He initially presented on August 23 with back pain. At that time he claimed films and basic workup which were reassuring. He has been seen multiple times since that time. Patient states that he has had ongoing pain. It starts over the mid lower back and radiates down his right leg. He denies any weakness or numbness of his extremities. He denies any bowel or bladder issues. He denies any IV drug use or fevers. He has taken multiple medications including steroids, Percocet, hydrocodone, Flexeril, naproxen without relief. Currently he rates his pain a 10 out of 10. He denies any injury. He has not followed up with [facility] or orthopedics as previously instructed.  Past Medical History  Diagnosis Date  . Hypertension    History reviewed. No pertinent past surgical history. Family History  Problem Relation Age of Onset  . Hyperlipidemia Father    History  Substance Use Topics  . Smoking status: Never Smoker   . Smokeless tobacco: Not on file  . Alcohol Use: No    Review of Systems  Constitutional: Negative.  Negative for fever.  Respiratory: Negative.  Negative for chest tightness and shortness of breath.   Cardiovascular: Negative.  Negative for chest pain.  Gastrointestinal: Negative.  Negative for abdominal pain.  Genitourinary: Negative.  Negative for dysuria and difficulty urinating.  Musculoskeletal: Positive for back pain. Negative for gait problem.  Neurological: Negative for weakness, numbness and headaches.  All other systems reviewed and are negative.     Allergies  Review of patient's allergies indicates no known allergies.  Home  Medications   Prior to Admission medications   Medication Sig Start Date End Date Taking? Authorizing Provider  amLODipine (NORVASC) 5 MG tablet Take 5 mg by mouth daily.   Yes Historical Provider, MD  ASPIRIN PO Take 1 tablet by mouth daily.   Yes Historical Provider, MD  cyclobenzaprine (FLEXERIL) 10 MG tablet Take 10 mg by mouth 2 (two) times daily as needed for muscle spasms.   Yes Historical Provider, MD  methocarbamol (ROBAXIN) 500 MG tablet Take 1 tablet (500 mg total) by mouth every 8 (eight) hours as needed for muscle spasms. 03/13/14  Yes Lyanne Co, MD  methocarbamol (ROBAXIN-750) 750 MG tablet Take 1 tablet (750 mg total) by mouth every 6 (six) hours as needed for muscle spasms. 03/08/14  Yes Olivia Mackie, MD  naproxen (NAPROSYN) 500 MG tablet Take 1 tablet (500 mg total) by mouth 2 (two) times daily. 03/13/14  Yes Lyanne Co, MD  gabapentin (NEURONTIN) 300 MG capsule Take 1 capsule (300 mg total) by mouth 2 (two) times daily. 03/21/14   Shon Baton, MD   BP 124/59  Pulse 58  Temp(Src) 98.2 F (36.8 C)  Resp 18  SpO2 95% Physical Exam  Nursing note and vitals reviewed. Constitutional: He is oriented to person, place, and time. He appears well-developed and well-nourished.  Uncomfortable appearing  HENT:  Head: Normocephalic and atraumatic.  Cardiovascular: Normal rate, regular rhythm and normal heart sounds.   No murmur heard. Pulmonary/Chest: Effort normal and breath sounds normal. No respiratory distress. He has no wheezes.  Abdominal: Soft.  There is no tenderness.  Musculoskeletal: He exhibits no edema.  Tenderness palpation over the lower lumbar spine and right SI joint, positive right straight leg raise  Neurological: He is alert and oriented to person, place, and time. No cranial nerve deficit.  5 out of 5 strength in the bilateral lower extremities, no clonus noted, normal reflexes  Skin: Skin is warm and dry.  Psychiatric: He has a normal mood and affect.     ED Course  Procedures (including critical care time) Labs Review Labs Reviewed - No data to display  Imaging Review No results found.   EKG Interpretation None      MDM   Final diagnoses:  Sciatica, right    Patient presents with persistent back pain. History and physical exam most consistent with sciatica which he has been diagnosed with before.  He is uncomfortable appearing. He has not followed up as directed. He is taken multiple medications that have not helped. Patient was given IM Dilaudid and gabapentin. Aside from the patient's age, he has no additional rib flags of back pain including IV drug use, fevers. He is neurologically intact. Given that the persistence of his pain, he likely need an outpatient MRI but there is no evidence of cauda equina or emergent need for MRI at this time.  Patient reports improvement of symptoms. Will add Neurontin to patient's pain medication regimen.  I implored patient to followup as previously directed with orthopedics and [facility].  Patient stated understanding.  After history, exam, and medical workup I feel the patient has been appropriately medically screened and is safe for discharge home. Pertinent diagnoses were discussed with the patient. Patient was given return precautions.   Shon Baton, MD 03/21/14 [phone]

## 2014-05-25 ENCOUNTER — Ambulatory Visit: Payer: Self-pay | Attending: Internal Medicine

## 2014-06-17 ENCOUNTER — Ambulatory Visit: Payer: Self-pay | Attending: Internal Medicine

## 2014-07-23 DIAGNOSIS — J159 Unspecified bacterial pneumonia: Secondary | ICD-10-CM | POA: Insufficient documentation

## 2014-07-23 DIAGNOSIS — I10 Essential (primary) hypertension: Secondary | ICD-10-CM | POA: Insufficient documentation

## 2014-07-23 DIAGNOSIS — G8929 Other chronic pain: Secondary | ICD-10-CM | POA: Insufficient documentation

## 2014-07-23 DIAGNOSIS — Z79899 Other long term (current) drug therapy: Secondary | ICD-10-CM | POA: Insufficient documentation

## 2014-07-23 DIAGNOSIS — M5430 Sciatica, unspecified side: Secondary | ICD-10-CM | POA: Insufficient documentation

## 2014-07-24 ENCOUNTER — Emergency Department (HOSPITAL_COMMUNITY): Payer: Self-pay

## 2014-07-24 ENCOUNTER — Encounter (HOSPITAL_COMMUNITY): Payer: Self-pay | Admitting: *Deleted

## 2014-07-24 ENCOUNTER — Emergency Department (HOSPITAL_COMMUNITY)
Admission: EM | Admit: 2014-07-24 | Discharge: 2014-07-24 | Disposition: A | Discharge status: Home / Self Care | Payer: Self-pay | Attending: Emergency Medicine | Admitting: Emergency Medicine

## 2014-07-24 DIAGNOSIS — R0602 Shortness of breath: Secondary | ICD-10-CM

## 2014-07-24 DIAGNOSIS — J189 Pneumonia, unspecified organism: Secondary | ICD-10-CM

## 2014-07-24 HISTORY — DX: Sciatica, unspecified side: M54.30

## 2014-07-24 HISTORY — DX: Other chronic pain: G89.29

## 2014-07-24 HISTORY — DX: Dorsalgia, unspecified: M54.9

## 2014-07-24 LAB — RAPID STREP SCREEN (MED CTR MEBANE ONLY): Streptococcus, Group A Screen (Direct): NEGATIVE

## 2014-07-24 MED ORDER — LEVOFLOXACIN 750 MG PO TABS
750.0000 mg | ORAL_TABLET | Freq: Once | ORAL | Status: AC
  Administered 2014-07-24: 750 mg via ORAL

## 2014-07-24 MED ORDER — ALBUTEROL SULFATE HFA 108 (90 BASE) MCG/ACT IN AERS
2.0000 | INHALATION_SPRAY | RESPIRATORY_TRACT | Status: AC
  Administered 2014-07-24: 2 via RESPIRATORY_TRACT
  Filled 2014-07-24: qty 6.7

## 2014-07-24 MED ORDER — IPRATROPIUM-ALBUTEROL 0.5-2.5 (3) MG/3ML IN SOLN
3.0000 mL | Freq: Once | RESPIRATORY_TRACT | Status: AC
  Administered 2014-07-24: 3 mL via RESPIRATORY_TRACT
  Filled 2014-07-24: qty 3

## 2014-07-24 MED ORDER — LEVOFLOXACIN 750 MG PO TABS: 750.0000 mg | ORAL_TABLET | Freq: Every day | ORAL | Status: DC

## 2014-07-24 NOTE — ED Notes (Signed)
The pt is c/o head  Neck throat and congestion today.  He has been [place].  He has little english speaking.  He has been coughing and he has rapid breathing

## 2014-07-24 NOTE — ED Provider Notes (Signed)
CSN: [id number]     Arrival date & time 07/23/14  2353 History   First MD Initiated Contact with Patient 07/24/14 0029     Chief Complaint  Patient presents with  . Shortness of Breath     HPI  Pt was seen at 0035.  Per pt, c/o gradual onset and persistence of constant sore throat, runny/stuffy nose, sinus congestion, post-nasal drip, and cough that began several hours ago. States he has been hearing himself "wheeze" when he coughs.  Denies fevers, no rash, no CP/SOB, no N/V/D, no abd pain.     Past Medical History  Diagnosis Date  . Hypertension   . Chronic back pain   . Sciatica    History reviewed. No pertinent past surgical history.   Family History  Problem Relation Age of Onset  . Hyperlipidemia Father    History  Substance Use Topics  . Smoking status: Never Smoker   . Smokeless tobacco: Not on file  . Alcohol Use: No    Review of Systems ROS: Statement: All systems negative except as marked or noted in the HPI; Constitutional: Negative for fever and chills. ; ; Eyes: Negative for eye pain, redness and discharge. ; ; ENMT: Negative for ear pain, hoarseness, +nasal congestion, sinus pressure and sore throat. ; ; Cardiovascular: Negative for chest pain, palpitations, diaphoresis, dyspnea and peripheral edema. ; ; Respiratory: +cough, wheezing. Negative for stridor. ; ; Gastrointestinal: Negative for nausea, vomiting, diarrhea, abdominal pain, blood in stool, hematemesis, jaundice and rectal bleeding. ; ; Genitourinary: Negative for dysuria, flank pain and hematuria. ; ; Musculoskeletal: Negative for back pain and neck pain. Negative for swelling and trauma.; ; Skin: Negative for pruritus, rash, abrasions, blisters, bruising and skin lesion.; ; Neuro: Negative for headache, lightheadedness and neck stiffness. Negative for weakness, altered level of consciousness , altered mental status, extremity weakness, paresthesias, involuntary movement, seizure and syncope.     Allergies   Review of patient's allergies indicates no known allergies.  Home Medications   Prior to Admission medications   Medication Sig Start Date End Date Taking? Authorizing Provider  amLODipine (NORVASC) 5 MG tablet Take 5 mg by mouth daily.   Yes Historical Provider, MD  cyclobenzaprine (FLEXERIL) 10 MG tablet Take 10 mg by mouth 2 (two) times daily as needed for muscle spasms.   Yes Historical Provider, MD  gabapentin (NEURONTIN) 300 MG capsule Take 1 capsule (300 mg total) by mouth 2 (two) times daily. 03/21/14  Yes Shon Baton, MD  methocarbamol (ROBAXIN) 500 MG tablet Take 1 tablet (500 mg total) by mouth every 8 (eight) hours as needed for muscle spasms. Patient not taking: Reported on 07/24/2014 03/13/14   Lyanne Co, MD  methocarbamol (ROBAXIN-750) 750 MG tablet Take 1 tablet (750 mg total) by mouth every 6 (six) hours as needed for muscle spasms. Patient not taking: Reported on 07/24/2014 03/08/14   Olivia Mackie, MD  naproxen (NAPROSYN) 500 MG tablet Take 1 tablet (500 mg total) by mouth 2 (two) times daily. Patient not taking: Reported on 07/24/2014 03/13/14   Lyanne Co, MD   BP 138/75 mmHg  Pulse 82  Temp(Src) 98 F (36.7 C) (Oral)  Resp 16  SpO2 96% Physical Exam  0040: Physical examination:  Nursing notes reviewed; Vital signs and O2 SAT reviewed;  Constitutional: Well developed, Well nourished, Well hydrated, In no acute distress; Head:  Normocephalic, atraumatic; Eyes: EOMI, PERRL, No scleral icterus; ENMT: TM's clear bilat. +edemetous nasal turbinates bilat with  clear rhinorrhea. Mouth and pharynx without lesions. No tonsillar exudates. No intra-oral edema. No submandibular or sublingual edema. No hoarse voice, no drooling, no stridor. No pain with manipulation of larynx. No trismus. Mouth and pharynx normal, Mucous membranes moist; Neck: Supple, Full range of motion, No lymphadenopathy; Cardiovascular: Regular rate and rhythm, No murmur, rub, or gallop; Respiratory: Breath  sounds clear & equal bilaterally, scattered exp wheezes. No audible wheezing. Speaking full sentences with ease, Normal respiratory effort/excursion; Chest: Nontender, Movement normal; Abdomen: Soft, Nontender, Nondistended, Normal bowel sounds; Genitourinary: No CVA tenderness; Extremities: Pulses normal, No tenderness, No edema, No calf edema or asymmetry.; Neuro: AA&Ox3, Major CN grossly intact.  Speech clear. No gross focal motor or sensory deficits in extremities.; Skin: Color normal, Warm, Dry.    ED Course  Procedures     EKG Interpretation None      MDM  MDM Reviewed: previous chart, nursing note and vitals Interpretation: x-ray and labs     Results for orders placed or performed during the hospital encounter of 07/24/14  Rapid strep screen  Result Value Ref Range   Streptococcus, Group A Screen (Direct) NEGATIVE NEGATIVE   Dg Chest 2 View 07/24/2014   CLINICAL DATA:  Acute onset of shortness of breath. Initial encounter.  EXAM: CHEST  2 VIEW  COMPARISON:  Chest radiograph performed 03/13/2014  FINDINGS: The lungs are well-aerated. There appears to be focal right middle lobe airspace opacity on the lateral view. There is no evidence of focal opacification, pleural effusion or pneumothorax.  The heart is normal in size; the mediastinal contour is within normal limits. No acute osseous abnormalities are seen.  IMPRESSION: Apparent focal right middle lobe airspace opacity noted on the lateral view. If there are clinical signs for pneumonia, would treat for pneumonia, then perform follow-up chest radiograph to ensure resolution. Otherwise, further evaluation could be considered.   Electronically Signed   By: Roanna Raider M.D.   On: 07/24/2014 01:23    0210:  Pt states he "feels better" after neb.  NAD, lungs CTA bilat, no wheezing, resps easy, speaking full sentences, Sats 100% R/A.  Pt ambulated around the ED with Sats remaining 96 % R/A, resps easy, NAD.  Pt states he wants to go  home now. Will tx for CAP. Dx and testing d/w pt.  Questions answered.  Verb understanding, agreeable to d/c home with outpt f/u.    Samuel Jester, DO 07/27/14 1305

## 2014-07-24 NOTE — ED Notes (Signed)
Pt had a steady gait and was not short of breath while walking. Pt heart rate stayed at 105 and spo2 did not drop below 96%. Pt reports having a headache after the walk.

## 2014-07-24 NOTE — Discharge Instructions (Signed)
°Emergency Department Resource Guide °1) Find a Doctor and Pay Out of Pocket °Although you won't have to find out who is covered by your insurance plan, it is a good idea to ask around and get recommendations. You will then need to call the office and see if the doctor you have chosen will accept you as a new patient and what types of options they offer for patients who are self-pay. Some doctors offer discounts or will set up payment plans for their patients who do not have insurance, but you will need to ask so you aren't surprised when you get to your appointment. ° °2) Contact Your Local Health Department °Not all health departments have doctors that can see patients for sick visits, but many do, so it is worth a call to see if yours does. If you don't know where your local health department is, you can check in your phone book. The CDC also has a tool to help you locate your state's health department, and many state websites also have listings of all of their local health departments. ° °3) Find a Walk-in Clinic °If your illness is not likely to be very severe or complicated, you may want to try a walk in clinic. These are popping up all over the country in pharmacies, drugstores, and shopping centers. They're usually staffed by nurse practitioners or physician assistants that have been trained to treat common illnesses and complaints. They're usually fairly quick and inexpensive. However, if you have serious medical issues or chronic medical problems, these are probably not your best option. ° °No Primary Care Doctor: °- Call [facility] at  832-8000 - they can help you locate a primary care doctor that  accepts your insurance, provides certain services, etc. °- Physician Referral Service- 1-800-533-3463 ° °Chronic Pain Problems: °Organization         Address  Phone   Notes  °[facility]  (336) 297-2271 Patients need to be referred by their primary care doctor.  ° °Medication  Assistance: °Organization         Address  Phone   Notes  °[place] [facility] [address] °[place], [place] [address] (336) 641-8030 --Must be a resident of [place] °-- Must have NO insurance coverage whatsoever (no Medicaid/ Medicare, etc.) °-- The pt. MUST have a primary care doctor that directs their care regularly and follows them in the community °  °[facility]  (866) 331-1348   °[employer]  (888) 892-1162   ° °Agencies that provide inexpensive medical care: °Organization         Address  Phone   Notes  °[facility]  (336) 832-8035   °[facility]    (336) 832-7272   °[facility] [address] °[place], [place] [address] (336) 832-4777   °[facility] [address], °[place] (336) 271-4999   °[facility]    (336) 373-0678   °[facility]    (336) 272-1050   °[facility] ° [address], [place] Phone:  (336) 832-4444, Fax:  (336) 832-4440 Hours of Operation:  9 am - 6 pm, M-F.  Also accepts Medicaid/Medicare and self-pay.  °[facility] ° [address], [place] Phone: (336) 832-3150, Fax: (336) 832-3151. Hours of Operation:  8:30 am - 5:30 pm, M-F.  Also accepts Medicaid and self-pay.  °[facility] [place] [address], [place] Phone: (336) 878-6027   °[facility] [address], [place], [place] (336)723-1848, Ext. 123 Mondays & Thursdays: 7-9 AM.  First 15 patients are seen on a first come, first serve basis. °  ° °Medicaid-accepting [place] Providers: ° °Organization         Address  Phone   Notes  °[facility] [address], [place] (336) 641-2100 Also accepts self-pay patients.  °[facility] [address], [place] ° (336) 856-9996   °[facility] [address], [place]  (336) 288-8857   °[facility] [address] [place] [address], [place] (336) 299-7000   °Veita Bland [address], [place]  ° (336) 373-1557 Only accepts [place] Access Medicaid patients after they have their name applied to their card.  ° °Self-Pay (no insurance) in [place]: ° °Organization         Address  Phone   Notes  °Sickle Cell Patients, [facility] [address], [place] (336) 832-1970   °[facility] [address], [place] (336) 832-4400   °[facility] ° [address], [place] (336) 992-4800   °[facility]/Dr. Osei-Bonsu ° [address] [place] [address], [place] or [address], [place] (336) 841-8500 Phone number for both [place] and [place] locations is the same.  °[facility] [address], [place] (336) 299-0000   °[facility] [address] [place] [address], [place] or [address] (336) 852-7530 °(336) 878-2260   °[facility] [address], [place] (336) 350-1642, phone; (336) 294-5005, fax Sees patients 1st and 3rd Saturday of every month.  Must not qualify for public or private insurance (i.e. Medicaid, Medicare, [place] Health Choice, Veterans' Benefits) • Household income should be no more than 200% of the poverty level •The clinic cannot treat you if you are pregnant or think you are pregnant • Sexually transmitted diseases are not treated at the clinic.  ° ° °Dental Care: °Organization         Address  Phone  Notes  °[place] [facility] [address], [place] (336) 641-6152 Accepts children up to age 21 who are enrolled in Medicaid or [place] Health Choice; pregnant women with a Medicaid card; and children who have applied for Medicaid or [place] Health Choice, but were declined, whose parents can pay a reduced fee at time of service.  °[place]  [facility] [place]  [address], [place] (336) 641-7733 Accepts children up to age 21 who are enrolled in Medicaid or [place] Health Choice; pregnant women with a Medicaid card; and children who have applied for Medicaid or [place] Health Choice, but were declined, whose parents can pay a reduced fee at time of service.  °[facility] ° [address],  (336) 641-4533 Patients are seen by appointment only. Walk-ins are not accepted. [facility] will see patients 18 years of age and older. °Monday - Tuesday (8am-5pm) °Most Wednesdays (8:30-5pm) °$30 per visit, cash only  °[facility] ° [address], [place] (336) 641-4533 Patients are seen by appointment only. Walk-ins are not accepted. [facility] will see patients 18 years of age and older. °One   Wednesday Evening (Monthly: Volunteer Based).  $30 per visit, cash only  °[facility]  (919) 537-3737 for adults; Children under age 4, call Graduate Pediatric Dentistry at (919) [phone]. Children aged 4-14, please call (919) [phone] to request a pediatric application. ° Dental services are provided in all areas of dental care including fillings, crowns and bridges, complete and partial dentures, implants, gum treatment, root canals, and extractions. Preventive care is also provided. Treatment is provided to both adults and children. °Patients are selected via a lottery and there is often a waiting list. °  °[facility] [address], °[place] ° (336) 763-8833 [email or web address] °  °[facility] [address], [place], [place] (336)723-1848, Ext. 123 Second and Fourth Thursday of each month, opens at 6:30 AM; Clinic ends at 9 AM.  Patients are seen on a first-come first-served basis, and a limited number are seen during each clinic.  ° °[facility] ° [address], [place], [place] (336) 723-7904    Eligibility Requirements °You must have lived in [place], [place], or [place] counties for at least the last three months. °  You cannot be eligible for state or federal sponsored healthcare insurance, including Veterans Administration, Medicaid, or Medicare. °  You generally cannot be eligible for healthcare insurance through your employer.  °  How to apply: °Eligibility screenings are held every Tuesday and Wednesday afternoon from 1:00 pm until 4:00 pm. You do not need an appointment for the interview!  °[facility] [address], [place], [place] 336-631-2330   °[facility]  336-342-8273   °[facility]  336-703-3100   °[facility]  336-570-6415   ° °Behavioral Health Resources in the Community: °Intensive Outpatient Programs °Organization         Address  Phone  Notes  °[facility] [address], [place], [place] 336-878-6098   °[facility] [address], [place], [place] 336-832-9800   °[facility] [address], [place], [place] ° 336-882-2125   °[facility] [address],  °[place], [place] 1-800-853-5163 or 336-641-4981   °Substance Abuse Resources °Organization         Address  Phone  Notes  °[facility]  336-882-2125   °[facility]  336-784-9470   °[facility]  336-285-9073   °[facility]  336-845-3988   °Residential & Outpatient Substance Abuse Program  1-800-659-3381   °Psychological Services °Organization         Address  Phone  Notes  °[facility]  336- 832-9600   °[facility]  336- 378-7881   °[facility] [address], [place] 1-800-853-5163 or 336-641-4981   ° °Mobile Crisis Teams °Organization         Address  Phone  Notes  °[facility], Mobile Crisis Care Unit  1-877-626-1772   °Assertive °[facility] ° [address]  [place], [place] 336-834-9664   °Sharon DeEsch [address] °[place] 336-554-5454   ° °Self-Help/Support Groups °Organization         Address  Phone             Notes  °[facility]  - variety of support groups  336- 373-1402 Call for more information  °Narcotics Anonymous (NA), [facility] [address], °[place]   2 meetings at this location  ° °  Residential Treatment Programs °Organization         Address  Phone  Notes  °[facility] [address],    °[place]  1-866-801-8205   °[facility] ° [address], [place], [place] 704-293-8524   °[facility] [address], [place] 336-845-3988 Admissions: 8am-3pm M-F  °[facility] [address]    °[place], [place] 336-841-1104   °[facility] [address], [place], [place] 336-379-7146   °[facility] [address]  °[place], [place] 336-285-9073   °[facility] - Intensive Outpatient [address], [place], [place] 336-852-3033   °[facility] [address]  °[place], [place] 1-877-615-2722 or 336-784-9470   °[facility] [address] [place], [place] 336-227-7417 Accepts Medicaid  °[facility] [address]  °[place] 1-800-659-3381 Substance Abuse/Addiction Treatment  ° °[place] Behavioral Health Resources °Organization         Address  Phone  Notes  °[facility]  (888) 581-9988   °Julie Brannon, PhD [address] [place], [place]   (336) 349-5553 or (336) 951-0000   °[facility]   [address] °[place], [place] (336) 349-4454   °[facility] [address], [place], [place] (336) 342-8316 Insurance/Medicaid/sponsorship through [facility]  °[facility] [address]                                    [place], [place] (336) 342-8316 Therapy/tele-psych/case    °[facility] [address]  ° [place], [place] (336) 349-2233    °Dr. Arfeen  (336) 349-4544   °[facility] [place]  [employer] [place] [facility] 1) [address], [place] °2) [address], [place] °3)  [address], [place] (336) 349-3220 °(336) 342-7768 ° °(336) 342-8140   °[place] [facility] (336) 342-1394 or (336) 342-3537 (After Hours)    ° ° ° °Take the prescription as directed.  Use your albuterol inhaler (2 to 4 puffs) every 4 hours for the next 7 days, then as needed for cough, wheezing, or shortness of breath.  Call your regular medical doctor Monday morning to schedule a follow up appointment within the next 3 days.  Return to the Emergency Department immediately sooner if worsening.  ° °

## 2014-07-24 NOTE — ED Notes (Signed)
Pt currently under going a breathing treatment. Will complete check pulse oximetry while ambulating after treatment.

## 2014-07-25 ENCOUNTER — Encounter: Payer: Self-pay | Admitting: Internal Medicine

## 2014-07-25 ENCOUNTER — Telehealth: Payer: Self-pay | Admitting: *Deleted

## 2014-07-25 ENCOUNTER — Ambulatory Visit (HOSPITAL_COMMUNITY)
Admission: RE | Admit: 2014-07-25 | Discharge: 2014-07-25 | Disposition: A | Discharge status: Home / Self Care | Payer: Self-pay | Source: Ambulatory Visit | Attending: Internal Medicine | Admitting: Internal Medicine

## 2014-07-25 ENCOUNTER — Ambulatory Visit: Payer: Self-pay | Attending: Internal Medicine | Admitting: Internal Medicine

## 2014-07-25 VITALS — BP 130/80 | HR 96 | Temp 97.9°F | Resp 16 | Wt 162.4 lb

## 2014-07-25 DIAGNOSIS — J309 Allergic rhinitis, unspecified: Secondary | ICD-10-CM | POA: Insufficient documentation

## 2014-07-25 DIAGNOSIS — Z833 Family history of diabetes mellitus: Secondary | ICD-10-CM | POA: Insufficient documentation

## 2014-07-25 DIAGNOSIS — M25562 Pain in left knee: Secondary | ICD-10-CM | POA: Insufficient documentation

## 2014-07-25 DIAGNOSIS — M25561 Pain in right knee: Secondary | ICD-10-CM | POA: Insufficient documentation

## 2014-07-25 DIAGNOSIS — Z79899 Other long term (current) drug therapy: Secondary | ICD-10-CM | POA: Insufficient documentation

## 2014-07-25 DIAGNOSIS — J189 Pneumonia, unspecified organism: Secondary | ICD-10-CM | POA: Insufficient documentation

## 2014-07-25 DIAGNOSIS — Z23 Encounter for immunization: Secondary | ICD-10-CM | POA: Insufficient documentation

## 2014-07-25 DIAGNOSIS — I10 Essential (primary) hypertension: Secondary | ICD-10-CM | POA: Insufficient documentation

## 2014-07-25 DIAGNOSIS — Z1211 Encounter for screening for malignant neoplasm of colon: Secondary | ICD-10-CM

## 2014-07-25 DIAGNOSIS — M170 Bilateral primary osteoarthritis of knee: Secondary | ICD-10-CM | POA: Insufficient documentation

## 2014-07-25 LAB — COMPLETE METABOLIC PANEL WITH GFR
ALK PHOS: 106 U/L (ref 39–117)
ALT: <8 U/L (ref 0–53)
AST: 12 U/L (ref 0–37)
Albumin: 3.9 g/dL (ref 3.5–5.2)
BUN: 14 mg/dL (ref 6–23)
CALCIUM: 9.7 mg/dL (ref 8.4–10.5)
CO2: 28 mEq/L (ref 19–32)
Chloride: 105 mEq/L (ref 96–112)
Creat: 1.33 mg/dL (ref 0.50–1.35)
GFR, Est African American: 64 mL/min
GFR, Est Non African American: 56 mL/min — ABNORMAL LOW
Glucose, Bld: 80 mg/dL (ref 70–99)
Potassium: 3.8 mEq/L (ref 3.5–5.3)
Sodium: 141 mEq/L (ref 135–145)
Total Bilirubin: 0.3 mg/dL (ref 0.2–1.2)
Total Protein: 6.9 g/dL (ref 6.0–8.3)

## 2014-07-25 LAB — TSH: TSH: 0.558 u[IU]/mL (ref 0.350–4.500)

## 2014-07-25 LAB — CBC WITH DIFFERENTIAL/PLATELET
Basophils Absolute: 0 10*3/uL (ref 0.0–0.1)
Basophils Relative: 1 % (ref 0–1)
EOS ABS: 0.6 10*3/uL (ref 0.0–0.7)
EOS PCT: 12 % — AB (ref 0–5)
HEMATOCRIT: 41.1 % (ref 39.0–52.0)
HEMOGLOBIN: 13.6 g/dL (ref 13.0–17.0)
Lymphocytes Relative: 32 % (ref 12–46)
Lymphs Abs: 1.6 10*3/uL (ref 0.7–4.0)
MCH: 26.9 pg (ref 26.0–34.0)
MCHC: 33.1 g/dL (ref 30.0–36.0)
MCV: 81.2 fL (ref 78.0–100.0)
MONOS PCT: 6 % (ref 3–12)
MPV: 10 fL (ref 8.6–12.4)
Monocytes Absolute: 0.3 10*3/uL (ref 0.1–1.0)
NEUTROS PCT: 49 % (ref 43–77)
Neutro Abs: 2.4 10*3/uL (ref 1.7–7.7)
Platelets: 183 10*3/uL (ref 150–400)
RBC: 5.06 MIL/uL (ref 4.22–5.81)
RDW: 13.7 % (ref 11.5–15.5)
WBC: 4.9 10*3/uL (ref 4.0–10.5)

## 2014-07-25 LAB — LIPID PANEL
Cholesterol: 172 mg/dL (ref 0–200)
HDL: 48 mg/dL (ref >39)
LDL Cholesterol: 94 mg/dL (ref 0–99)
Total CHOL/HDL Ratio: 3.6 Ratio
Triglycerides: 151 mg/dL — ABNORMAL HIGH (ref <150)
VLDL: 30 mg/dL (ref 0–40)

## 2014-07-25 LAB — HEMOGLOBIN A1C
HEMOGLOBIN A1C: 5.8 % — AB (ref <5.7)
Mean Plasma Glucose: 120 mg/dL — ABNORMAL HIGH (ref <117)

## 2014-07-25 MED ORDER — FLUTICASONE PROPIONATE 50 MCG/ACT NA SUSP: 2.0000 | Freq: Every day | NASAL | Status: DC

## 2014-07-25 NOTE — Telephone Encounter (Signed)
-----   Message from Doris Cheadle, MD sent at 07/25/2014  1:02 PM EST ----- Call and let the patient know that his knee x-ray reported  Osteoarthritis, if patient's symptoms are worsening, then will recommend referral to orthopedics.

## 2014-07-25 NOTE — Progress Notes (Signed)
Patient Demographics  Tyrone Mcdonald, is a 64 y.o. male  IRC:[id number]  PZW:[id number]  DOB - [birth year]  CC:  Chief Complaint  Patient presents with  . Hospitalization Follow-up  . Establish Care       HPI: Tyrone Mcdonald is a 64 y.o. male here today to establish medical care.Patient has history of hypertension, yesterday he went to the emergency room with symptoms of shortness of breath, EMR reviewed, he  had a chest x-ray done which was suggestive of pneumonia, initially patient was treated with nebulization and was discharged on antibiotic Levaquin as per patient he is going to fill in  the prescription today. His blood pressure was elevated, repeat manual blood pressure is 130/80, he denies any headache dizziness, he does report chronic history of nasal congestion stuffy nose postnasal drip and minimal productive cough. Patient denies smoking cigarettes. He also complains of bilateral knee pain which is chronic as per patient he also got injection in his knees in the past. Patient has No headache, No chest pain, No abdominal pain - No Nausea, No new weakness tingling or numbness, No Cough - SOB.  No Known Allergies Past Medical History  Diagnosis Date  . Hypertension   . Chronic back pain   . Sciatica    Current Outpatient Prescriptions on File Prior to Visit  Medication Sig Dispense Refill  . amLODipine (NORVASC) 5 MG tablet Take 5 mg by mouth daily.    . cyclobenzaprine (FLEXERIL) 10 MG tablet Take 10 mg by mouth 2 (two) times daily as needed for muscle spasms.    Marland Kitchen gabapentin (NEURONTIN) 300 MG capsule Take 1 capsule (300 mg total) by mouth 2 (two) times daily. 60 capsule 0  . levofloxacin (LEVAQUIN) 750 MG tablet Take 1 tablet (750 mg total) by mouth daily. For the next 4 days (start 07/25/2014) 4 tablet 0  . methocarbamol (ROBAXIN) 500 MG tablet Take 1 tablet (500 mg total) by mouth every 8 (eight) hours as needed for muscle spasms. (Patient not taking: Reported on  07/24/2014) 20 tablet 0  . methocarbamol (ROBAXIN-750) 750 MG tablet Take 1 tablet (750 mg total) by mouth every 6 (six) hours as needed for muscle spasms. (Patient not taking: Reported on 07/24/2014) 40 tablet 0  . naproxen (NAPROSYN) 500 MG tablet Take 1 tablet (500 mg total) by mouth 2 (two) times daily. (Patient not taking: Reported on 07/24/2014) 30 tablet 0   No current facility-administered medications on file prior to visit.   Family History  Problem Relation Age of Onset  . Hyperlipidemia Father   . Cancer Brother   . Diabetes Paternal Uncle    History   Social History  . Marital Status: Married    Spouse Name: N/A    Number of Children: N/A  . Years of Education: N/A   Occupational History  . Not on file.   Social History Main Topics  . Smoking status: Never Smoker   . Smokeless tobacco: Not on file  . Alcohol Use: No  . Drug Use: No  . Sexual Activity: Not on file   Other Topics Concern  . Not on file   Social History Narrative   ** Merged History Encounter **        Review of Systems: Constitutional: Negative for fever, chills, diaphoresis, activity change, appetite change and fatigue. HENT: Negative for ear pain, nosebleeds, congestion, facial swelling, rhinorrhea, neck pain, neck stiffness and ear discharge.  Eyes: Negative for pain, discharge, redness, itching and  visual disturbance. Respiratory: Negative for cough, choking, chest tightness, shortness of breath, wheezing and stridor.  Cardiovascular: Negative for chest pain, palpitations and leg swelling. Gastrointestinal: Negative for abdominal distention. Genitourinary: Negative for dysuria, urgency, frequency, hematuria, flank pain, decreased urine volume, difficulty urinating and dyspareunia.  Musculoskeletal: Negative for back pain, joint swelling, arthralgia and gait problem. Neurological: Negative for dizziness, tremors, seizures, syncope, facial asymmetry, speech difficulty, weakness,  light-headedness, numbness and headaches.  Hematological: Negative for adenopathy. Does not bruise/bleed easily. Psychiatric/Behavioral: Negative for hallucinations, behavioral problems, confusion, dysphoric mood, decreased concentration and agitation.    Objective:   Filed Vitals:   07/25/14 0946  BP: 130/80  Pulse:   Temp:   Resp:     Physical Exam: Constitutional: Patient appears well-developed and well-nourished. No distress. HENT: Normocephalic, atraumatic, External right and left ear normal. Oropharynx is clear and moist.nasal congestion no sinus tenderness  Eyes: Conjunctivae and EOM are normal. PERRLA, no scleral icterus. Neck: Normal ROM. Neck supple. No JVD. No tracheal deviation. No thyromegaly. CVS: RRR, S1/S2 +, no murmurs, no gallops, no carotid bruit.  Pulmonary: Effort and breath sounds normal, no stridor, rhonchi, wheezes, rales.  Abdominal: Soft. BS +, no distension, tenderness, rebound or guarding.  Musculoskeletal: Normal range of motion. No edema and no tenderness.  Neuro: Alert. Normal reflexes, muscle tone coordination. No cranial nerve deficit. Skin: Skin is warm and dry. No rash noted. Not diaphoretic. No erythema. No pallor. Psychiatric: Normal mood and affect. Behavior, judgment, thought content normal.  Lab Results  Component Value Date   WBC 6.9 01/29/2012   HGB 13.7 01/29/2012   HCT 40.7 01/29/2012   MCV 80.6 01/29/2012   PLT 162 01/29/2012   Lab Results  Component Value Date   CREATININE 1.33 01/29/2012   BUN 20 01/29/2012   NA 140 01/29/2012   K 3.5 01/29/2012   CL 103 01/29/2012   CO2 18* 01/29/2012    No results found for: HGBA1C Lipid Panel  No results found for: CHOL, TRIG, HDL, CHOLHDL, VLDL, LDLCALC     Assessment and plan:   1. Essential hypertension Advised patient for DASH diet, continue with Norvasc. Ordered baseline blood work. - COMPLETE METABOLIC PANEL WITH GFR - TSH - Lipid panel - Vit D  25 hydroxy (rtn  osteoporosis monitoring)  3. CAP (community acquired pneumonia) Patient will start taking Levaquin. Albuterol when necessary for wheezing. Will repeat chest x-ray on the following visit. - CBC with Differential  4. Needs flu shot Flu shot given today  5. Family history of diabetes mellitus (DM) Will check - Hemoglobin A1c  6. Special screening for malignant neoplasms, colon  - Ambulatory referral to Gastroenterology  7. Bilateral knee pain OTC  ibuprofen when necessary - DG Knee Bilateral Standing AP; Future  8. Allergic rhinitis, unspecified allergic rhinitis type prescribed - fluticasone (FLONASE) 50 MCG/ACT nasal spray; Place 2 sprays into both nostrils daily.  Dispense: 16 g; Refill: 1   Health Maintenance -Colonoscopy: referred to GI  -Vaccinations:  Flu shot given today   Return in about 3 months (around 10/24/2014) for hypertension.  Doris Cheadle, MD

## 2014-07-25 NOTE — Progress Notes (Signed)
Patient here with interpreter Patient is here for follow up from the ED Was seen yesterday for SOB and diagnosed with community acquired pneumonia Patient just dropped off prescription for his antibiotic Patient complains of a pain in his nose

## 2014-07-25 NOTE — Patient Instructions (Signed)
DASH Eating Plan °DASH stands for "Dietary Approaches to Stop Hypertension." The DASH eating plan is a healthy eating plan that has been shown to reduce high blood pressure (hypertension). Additional health benefits may include reducing the risk of type 2 diabetes mellitus, heart disease, and stroke. The DASH eating plan may also help with weight loss. °WHAT DO I NEED TO KNOW ABOUT THE DASH EATING PLAN? °For the DASH eating plan, you will follow these general guidelines: °· Choose foods with a percent daily value for sodium of less than 5% (as listed on the food label). °· Use salt-free seasonings or herbs instead of table salt or sea salt. °· Check with your health care provider or pharmacist before using salt substitutes. °· Eat lower-sodium products, often labeled as "lower sodium" or "no salt added." °· Eat fresh foods. °· Eat more vegetables, fruits, and low-fat dairy products. °· Choose whole grains. Look for the word "whole" as the first word in the ingredient list. °· Choose fish and skinless chicken or turkey more often than red meat. Limit fish, poultry, and meat to 6 oz (170 g) each day. °· Limit sweets, desserts, sugars, and sugary drinks. °· Choose heart-healthy fats. °· Limit cheese to 1 oz (28 g) per day. °· Eat more home-cooked food and less restaurant, buffet, and fast food. °· Limit fried foods. °· Cook foods using methods other than frying. °· Limit canned vegetables. If you do use them, rinse them well to decrease the sodium. °· When eating at a restaurant, ask that your food be prepared with less salt, or no salt if possible. °WHAT FOODS CAN I EAT? °Seek help from a dietitian for individual calorie needs. °Grains °Whole grain or whole wheat bread. Brown rice. Whole grain or whole wheat pasta. Quinoa, bulgur, and whole grain cereals. Low-sodium cereals. Corn or whole wheat flour tortillas. Whole grain cornbread. Whole grain crackers. Low-sodium crackers. °Vegetables °Fresh or frozen vegetables  (raw, steamed, roasted, or grilled). Low-sodium or reduced-sodium tomato and vegetable juices. Low-sodium or reduced-sodium tomato sauce and paste. Low-sodium or reduced-sodium canned vegetables.  °Fruits °All fresh, canned (in natural juice), or frozen fruits. °Meat and Other Protein Products °Ground beef (85% or leaner), grass-fed beef, or beef trimmed of fat. Skinless chicken or turkey. Ground chicken or turkey. Pork trimmed of fat. All fish and seafood. Eggs. Dried beans, peas, or lentils. Unsalted nuts and seeds. Unsalted canned beans. °Dairy °Low-fat dairy products, such as skim or 1% milk, 2% or reduced-fat cheeses, low-fat ricotta or cottage cheese, or plain low-fat yogurt. Low-sodium or reduced-sodium cheeses. °Fats and Oils °Tub margarines without trans fats. Light or reduced-fat mayonnaise and salad dressings (reduced sodium). Avocado. Safflower, olive, or canola oils. Natural peanut or almond butter. °Other °Unsalted popcorn and pretzels. °The items listed above may not be a complete list of recommended foods or beverages. Contact your dietitian for more options. °WHAT FOODS ARE NOT RECOMMENDED? °Grains °White bread. White pasta. White rice. Refined cornbread. Bagels and croissants. Crackers that contain trans fat. °Vegetables °Creamed or fried vegetables. Vegetables in a cheese sauce. Regular canned vegetables. Regular canned tomato sauce and paste. Regular tomato and vegetable juices. °Fruits °Dried fruits. Canned fruit in light or heavy syrup. Fruit juice. °Meat and Other Protein Products °Fatty cuts of meat. Ribs, chicken wings, bacon, sausage, bologna, salami, chitterlings, fatback, hot dogs, bratwurst, and packaged luncheon meats. Salted nuts and seeds. Canned beans with salt. °Dairy °Whole or 2% milk, cream, half-and-half, and cream cheese. Whole-fat or sweetened yogurt. Full-fat   cheeses or blue cheese. Nondairy creamers and whipped toppings. Processed cheese, cheese spreads, or cheese  curds. °Condiments °Onion and garlic salt, seasoned salt, table salt, and sea salt. Canned and packaged gravies. Worcestershire sauce. Tartar sauce. Barbecue sauce. Teriyaki sauce. Soy sauce, including reduced sodium. Steak sauce. Fish sauce. Oyster sauce. Cocktail sauce. Horseradish. Ketchup and mustard. Meat flavorings and tenderizers. Bouillon cubes. Hot sauce. Tabasco sauce. Marinades. Taco seasonings. Relishes. °Fats and Oils °Butter, stick margarine, lard, shortening, ghee, and bacon fat. Coconut, palm kernel, or palm oils. Regular salad dressings. °Other °Pickles and olives. Salted popcorn and pretzels. °The items listed above may not be a complete list of foods and beverages to avoid. Contact your dietitian for more information. °WHERE CAN I FIND MORE INFORMATION? °National Heart, [employer], and [employer] Institute: www.nhlbi.nih.gov/health/health-topics/topics/dash/ °Document Released: 06/13/2011 Document Revised: 11/08/2013 Document Reviewed: 04/28/2013 °ExitCare® Patient Information ©2015 [employer]. This information is not intended to replace advice given to you by your health care provider. Make sure you discuss any questions you have with your health care provider. ° °

## 2014-07-25 NOTE — Telephone Encounter (Signed)
[employer] Interpreted [phone] Pt aware of X-ray Results. Will let PCP know if Sx worsen

## 2014-07-26 ENCOUNTER — Telehealth: Payer: Self-pay | Admitting: *Deleted

## 2014-07-26 LAB — CULTURE, GROUP A STREP

## 2014-07-26 LAB — VITAMIN D 25 HYDROXY (VIT D DEFICIENCY, FRACTURES): VIT D 25 HYDROXY: 10 ng/mL — AB (ref 30–100)

## 2014-07-26 MED ORDER — VITAMIN D (ERGOCALCIFEROL) 1.25 MG (50000 UNIT) PO CAPS: 50000.0000 [IU] | ORAL_CAPSULE | ORAL | Status: DC

## 2014-07-26 NOTE — Telephone Encounter (Signed)
-----   Message from Doris Cheadle, MD sent at 07/26/2014  9:23 AM EST ----- Blood work reviewed, noticed low vitamin D, call patient advise to start ergocalciferol 50,000 units once a week for the duration of  12 weeks.  noticed hemoglobin A1c of 5.8%, patient has prediabetes, call and advise patient for low carbohydrate diet.

## 2014-07-26 NOTE — Telephone Encounter (Signed)
Used [employer] Arabic # [id number] Pt aware of results. Rx send to [facility] pharmacy

## 2014-08-12 ENCOUNTER — Ambulatory Visit: Payer: Self-pay

## 2014-09-30 ENCOUNTER — Emergency Department (HOSPITAL_COMMUNITY): Payer: Medicare Other

## 2014-09-30 ENCOUNTER — Encounter (HOSPITAL_COMMUNITY): Payer: Self-pay | Admitting: Emergency Medicine

## 2014-09-30 ENCOUNTER — Emergency Department (HOSPITAL_COMMUNITY)
Admission: EM | Admit: 2014-09-30 | Discharge: 2014-09-30 | Disposition: A | Discharge status: Home / Self Care | Payer: Medicare Other | Attending: Emergency Medicine | Admitting: Emergency Medicine

## 2014-09-30 DIAGNOSIS — R51 Headache: Secondary | ICD-10-CM | POA: Insufficient documentation

## 2014-09-30 DIAGNOSIS — Z7951 Long term (current) use of inhaled steroids: Secondary | ICD-10-CM | POA: Insufficient documentation

## 2014-09-30 DIAGNOSIS — G8929 Other chronic pain: Secondary | ICD-10-CM | POA: Insufficient documentation

## 2014-09-30 DIAGNOSIS — R52 Pain, unspecified: Secondary | ICD-10-CM

## 2014-09-30 DIAGNOSIS — R519 Headache, unspecified: Secondary | ICD-10-CM

## 2014-09-30 DIAGNOSIS — R062 Wheezing: Secondary | ICD-10-CM | POA: Insufficient documentation

## 2014-09-30 DIAGNOSIS — M791 Myalgia: Secondary | ICD-10-CM | POA: Insufficient documentation

## 2014-09-30 DIAGNOSIS — Z79899 Other long term (current) drug therapy: Secondary | ICD-10-CM | POA: Insufficient documentation

## 2014-09-30 DIAGNOSIS — R0602 Shortness of breath: Secondary | ICD-10-CM | POA: Insufficient documentation

## 2014-09-30 DIAGNOSIS — R6883 Chills (without fever): Secondary | ICD-10-CM | POA: Insufficient documentation

## 2014-09-30 DIAGNOSIS — I10 Essential (primary) hypertension: Secondary | ICD-10-CM | POA: Insufficient documentation

## 2014-09-30 LAB — CBC WITH DIFFERENTIAL/PLATELET
BASOS PCT: 1 % (ref 0–1)
Basophils Absolute: 0 10*3/uL (ref 0.0–0.1)
EOS ABS: 0.4 10*3/uL (ref 0.0–0.7)
Eosinophils Relative: 8 % — ABNORMAL HIGH (ref 0–5)
HEMATOCRIT: 41 % (ref 39.0–52.0)
Hemoglobin: 13.3 g/dL (ref 13.0–17.0)
LYMPHS ABS: 2.6 10*3/uL (ref 0.7–4.0)
Lymphocytes Relative: 46 % (ref 12–46)
MCH: 26.2 pg (ref 26.0–34.0)
MCHC: 32.4 g/dL (ref 30.0–36.0)
MCV: 80.9 fL (ref 78.0–100.0)
Monocytes Absolute: 0.5 10*3/uL (ref 0.1–1.0)
Monocytes Relative: 8 % (ref 3–12)
NEUTROS PCT: 39 % — AB (ref 43–77)
Neutro Abs: 2.2 10*3/uL (ref 1.7–7.7)
PLATELETS: 172 10*3/uL (ref 150–400)
RBC: 5.07 MIL/uL (ref 4.22–5.81)
RDW: 13.2 % (ref 11.5–15.5)
WBC: 5.7 10*3/uL (ref 4.0–10.5)

## 2014-09-30 LAB — CBG MONITORING, ED: Glucose-Capillary: 127 mg/dL — ABNORMAL HIGH (ref 70–99)

## 2014-09-30 LAB — BASIC METABOLIC PANEL
Anion gap: 8 (ref 5–15)
BUN: 25 mg/dL — AB (ref 6–23)
CO2: 26 mmol/L (ref 19–32)
Calcium: 9.3 mg/dL (ref 8.4–10.5)
Chloride: 102 mmol/L (ref 96–112)
Creatinine, Ser: 1.58 mg/dL — ABNORMAL HIGH (ref 0.50–1.35)
GFR calc Af Amer: 51 mL/min — ABNORMAL LOW (ref >90)
GFR, EST NON AFRICAN AMERICAN: 44 mL/min — AB (ref >90)
GLUCOSE: 115 mg/dL — AB (ref 70–99)
Potassium: 3.6 mmol/L (ref 3.5–5.1)
Sodium: 136 mmol/L (ref 135–145)

## 2014-09-30 MED ORDER — DIPHENHYDRAMINE HCL 50 MG/ML IJ SOLN
25.0000 mg | Freq: Once | INTRAMUSCULAR | Status: AC
  Administered 2014-09-30: 25 mg via INTRAVENOUS
  Filled 2014-09-30: qty 1

## 2014-09-30 MED ORDER — PROCHLORPERAZINE EDISYLATE 5 MG/ML IJ SOLN
10.0000 mg | Freq: Four times a day (QID) | INTRAMUSCULAR | Status: DC | PRN
  Administered 2014-09-30: 10 mg via INTRAVENOUS
  Filled 2014-09-30: qty 2

## 2014-09-30 MED ORDER — SODIUM CHLORIDE 0.9 % IV BOLUS (SEPSIS)
1000.0000 mL | Freq: Once | INTRAVENOUS | Status: AC
  Administered 2014-09-30: 1000 mL via INTRAVENOUS

## 2014-09-30 NOTE — ED Notes (Signed)
Pt presents with a headache since yesterday that is sensitive to light and sound- pt admits to dizziness and weakness.  Neuro exam normal.  Pt alert and oriented X 4.

## 2014-09-30 NOTE — ED Provider Notes (Signed)
CSN: [id number]     Arrival date & time 09/30/14  0019 History  This chart was scribed for Shon Baton, MD by Annye Asa, ED Scribe. This patient was seen in room D36C/D36C and the patient's care was started at 12:53 AM.    Chief Complaint  Patient presents with  . Headache   Patient is a 64 y.o. male presenting with headaches. The history is provided by the patient. No language interpreter was used.  Headache Associated symptoms: myalgias   Associated symptoms: no abdominal pain, no back pain, no diarrhea, no neck pain, no neck stiffness and no vomiting      HPI Comments: Paola Eltom Perkin is a 64 y.o. male with past medical history of HTN, chronic back pain, sciatica who presents to the Emergency Department complaining of headache beginning yesterday. He reports generalized myalgias, cough, subjective fever and chills. Patient states he took Advil without significant relief. He notes occasional headaches at baseline. He denies chest pain, SOB, weakness, numbness, tingling, vision changes. He rates current headache at 10 out of 10. Denies worst headache of his life.   He is a nonsmoker. He just finished a course of Levaquin for recent pneumonia.   Past Medical History  Diagnosis Date  . Hypertension   . Chronic back pain   . Sciatica    History reviewed. No pertinent past surgical history. Family History  Problem Relation Age of Onset  . Hyperlipidemia Father   . Cancer Brother   . Diabetes Paternal Uncle    History  Substance Use Topics  . Smoking status: Never Smoker   . Smokeless tobacco: Not on file  . Alcohol Use: No    Review of Systems  Constitutional: Positive for chills.  Respiratory: Positive for shortness of breath.   Cardiovascular: Negative.  Negative for chest pain.  Gastrointestinal: Negative.  Negative for vomiting, abdominal pain and diarrhea.  Genitourinary: Negative.  Negative for dysuria.  Musculoskeletal: Positive for myalgias. Negative for  back pain, neck pain and neck stiffness.  Skin: Negative for rash.  Neurological: Positive for headaches.  All other systems reviewed and are negative.   Allergies  Review of patient's allergies indicates no known allergies.  Home Medications   Prior to Admission medications   Medication Sig Start Date End Date Taking? Authorizing Provider  albuterol (PROVENTIL HFA;VENTOLIN HFA) 108 (90 BASE) MCG/ACT inhaler Inhale into the lungs every 6 (six) hours as needed for wheezing or shortness of breath.   Yes Historical Provider, MD  amLODipine (NORVASC) 5 MG tablet Take 5 mg by mouth daily.   Yes Historical Provider, MD  cyclobenzaprine (FLEXERIL) 10 MG tablet Take 10 mg by mouth 2 (two) times daily as needed for muscle spasms.   Yes Historical Provider, MD  fluticasone (FLONASE) 50 MCG/ACT nasal spray Place 2 sprays into both nostrils daily. 07/25/14  Yes Doris Cheadle, MD  gabapentin (NEURONTIN) 300 MG capsule Take 1 capsule (300 mg total) by mouth 2 (two) times daily. 03/21/14  Yes Shon Baton, MD  Vitamin D, Ergocalciferol, (DRISDOL) 50000 UNITS CAPS capsule Take 1 capsule (50,000 Units total) by mouth every 7 (seven) days. 07/26/14  Yes Doris Cheadle, MD  levofloxacin (LEVAQUIN) 750 MG tablet Take 1 tablet (750 mg total) by mouth daily. For the next 4 days (start 07/25/2014) Patient not taking: Reported on 09/30/2014 07/24/14   Samuel Jester, DO  methocarbamol (ROBAXIN) 500 MG tablet Take 1 tablet (500 mg total) by mouth every 8 (eight) hours as needed for  muscle spasms. Patient not taking: Reported on 07/24/2014 03/13/14   Azalia Bilis, MD  methocarbamol (ROBAXIN-750) 750 MG tablet Take 1 tablet (750 mg total) by mouth every 6 (six) hours as needed for muscle spasms. Patient not taking: Reported on 07/24/2014 03/08/14   Marisa Severin, MD  naproxen (NAPROSYN) 500 MG tablet Take 1 tablet (500 mg total) by mouth 2 (two) times daily. Patient not taking: Reported on 07/24/2014 03/13/14   Azalia Bilis, MD    BP 149/78 mmHg  Pulse 79  Temp(Src) 98.3 F (36.8 C) (Oral)  Resp 15  Wt 165 lb (74.844 kg)  SpO2 99% Physical Exam  Constitutional: He is oriented to person, place, and time. No distress.  HENT:  Head: Normocephalic and atraumatic.  Eyes: Pupils are equal, round, and reactive to light.  Neck: Normal range of motion. Neck supple.  No meningismus noted  Cardiovascular: Normal rate, regular rhythm and normal heart sounds.   No murmur heard. Pulmonary/Chest: Effort normal. No respiratory distress. He has wheezes.  Abdominal: Soft. Bowel sounds are normal. There is no tenderness. There is no rebound.  Musculoskeletal: He exhibits no edema.  Neurological: He is alert and oriented to person, place, and time.  5 out of 5 strength in all 4 extremities  Skin: Skin is warm and dry.  Psychiatric: He has a normal mood and affect.  Nursing note and vitals reviewed.   ED Course  Procedures   DIAGNOSTIC STUDIES: Oxygen Saturation is 96% on RA, adequate by my interpretation.    COORDINATION OF CARE: 12:58 AM Discussed treatment plan with pt at bedside and pt agreed to plan.   Labs Review Labs Reviewed  CBC WITH DIFFERENTIAL/PLATELET - Abnormal; Notable for the following:    Neutrophils Relative % 39 (*)    Eosinophils Relative 8 (*)    All other components within normal limits  BASIC METABOLIC PANEL - Abnormal; Notable for the following:    Glucose, Bld 115 (*)    BUN 25 (*)    Creatinine, Ser 1.58 (*)    GFR calc non Af Amer 44 (*)    GFR calc Af Amer 51 (*)    All other components within normal limits  CBG MONITORING, ED - Abnormal; Notable for the following:    Glucose-Capillary 127 (*)    All other components within normal limits    Imaging Review Dg Chest 2 View  09/30/2014   CLINICAL DATA:  Headache and body aches.  History of hypertension.  EXAM: CHEST  2 VIEW  COMPARISON:  07/24/2014  FINDINGS: Diminished opacity in the right middle lobe from prior. The  cardiomediastinal contours are normal. Pulmonary vasculature is normal. No consolidation, pleural effusion, or pneumothorax. No acute osseous abnormalities are seen.  IMPRESSION: No acute pulmonary process.   Electronically Signed   By: Rubye Oaks M.D.   On: 09/30/2014 01:23     EKG Interpretation None      MDM   Final diagnoses:  Acute nonintractable headache, unspecified headache type  Body aches   Patient presents with headache, myalgias, and chills. Nontoxic on exam. Afebrile. Scant wheezing noted on exam. Patient reports that he recently finished a course of Levaquin; however per chart review it appears that he was Levaquin in January. He is wheezing on exam but otherwise exam is largely benign. Neurologically intact. Patient given migraine cocktail.  Lab work is larger reassuring. No evidence of leukocytosis or left shift. On recheck, patient exam remains reassuring. He reports improvement of his headache  like to be discharged. Suspect acute viral syndrome. Low suspicion at this time for meningitis or subarachnoid hemorrhage as a cause for his headache.  After history, exam, and medical workup I feel the patient has been appropriately medically screened and is safe for discharge home. Pertinent diagnoses were discussed with the patient. Patient was given return precautions.  I personally performed the services described in this documentation, which was scribed in my presence. The recorded information has been reviewed and is accurate.      Shon Baton, MD 09/30/14 [id number]

## 2014-09-30 NOTE — Discharge Instructions (Signed)

## 2014-09-30 NOTE — ED Notes (Signed)
Pt. Left with all belongings 

## 2014-10-24 ENCOUNTER — Ambulatory Visit: Payer: Medicare Other | Attending: Internal Medicine | Admitting: Internal Medicine

## 2014-10-24 ENCOUNTER — Encounter: Payer: Self-pay | Admitting: Internal Medicine

## 2014-10-24 VITALS — BP 130/80 | HR 93 | Temp 98.0°F | Resp 16 | Wt 160.0 lb

## 2014-10-24 DIAGNOSIS — E559 Vitamin D deficiency, unspecified: Secondary | ICD-10-CM | POA: Diagnosis not present

## 2014-10-24 DIAGNOSIS — Z87898 Personal history of other specified conditions: Secondary | ICD-10-CM | POA: Diagnosis not present

## 2014-10-24 DIAGNOSIS — J309 Allergic rhinitis, unspecified: Secondary | ICD-10-CM | POA: Insufficient documentation

## 2014-10-24 DIAGNOSIS — I10 Essential (primary) hypertension: Secondary | ICD-10-CM | POA: Insufficient documentation

## 2014-10-24 DIAGNOSIS — Z7951 Long term (current) use of inhaled steroids: Secondary | ICD-10-CM | POA: Insufficient documentation

## 2014-10-24 DIAGNOSIS — Z791 Long term (current) use of non-steroidal anti-inflammatories (NSAID): Secondary | ICD-10-CM | POA: Insufficient documentation

## 2014-10-24 DIAGNOSIS — R51 Headache: Secondary | ICD-10-CM | POA: Insufficient documentation

## 2014-10-24 MED ORDER — FLUTICASONE PROPIONATE 50 MCG/ACT NA SUSP: 2.0000 | Freq: Every day | NASAL | Status: DC

## 2014-10-24 NOTE — Progress Notes (Signed)
Patient here for follow up from the ED Was seen in the emergency for headache that he had for two days associated With a lot of pressure near his sinus area Patient states does not need refills at this time

## 2014-10-24 NOTE — Patient Instructions (Signed)
DASH Eating Plan °DASH stands for "Dietary Approaches to Stop Hypertension." The DASH eating plan is a healthy eating plan that has been shown to reduce high blood pressure (hypertension). Additional health benefits may include reducing the risk of type 2 diabetes mellitus, heart disease, and stroke. The DASH eating plan may also help with weight loss. °WHAT DO I NEED TO KNOW ABOUT THE DASH EATING PLAN? °For the DASH eating plan, you will follow these general guidelines: °· Choose foods with a percent daily value for sodium of less than 5% (as listed on the food label). °· Use salt-free seasonings or herbs instead of table salt or sea salt. °· Check with your health care provider or pharmacist before using salt substitutes. °· Eat lower-sodium products, often labeled as "lower sodium" or "no salt added." °· Eat fresh foods. °· Eat more vegetables, fruits, and low-fat dairy products. °· Choose whole grains. Look for the word "whole" as the first word in the ingredient list. °· Choose fish and skinless chicken or turkey more often than red meat. Limit fish, poultry, and meat to 6 oz (170 g) each day. °· Limit sweets, desserts, sugars, and sugary drinks. °· Choose heart-healthy fats. °· Limit cheese to 1 oz (28 g) per day. °· Eat more home-cooked food and less restaurant, buffet, and fast food. °· Limit fried foods. °· Cook foods using methods other than frying. °· Limit canned vegetables. If you do use them, rinse them well to decrease the sodium. °· When eating at a restaurant, ask that your food be prepared with less salt, or no salt if possible. °WHAT FOODS CAN I EAT? °Seek help from a dietitian for individual calorie needs. °Grains °Whole grain or whole wheat bread. Brown rice. Whole grain or whole wheat pasta. Quinoa, bulgur, and whole grain cereals. Low-sodium cereals. Corn or whole wheat flour tortillas. Whole grain cornbread. Whole grain crackers. Low-sodium crackers. °Vegetables °Fresh or frozen vegetables  (raw, steamed, roasted, or grilled). Low-sodium or reduced-sodium tomato and vegetable juices. Low-sodium or reduced-sodium tomato sauce and paste. Low-sodium or reduced-sodium canned vegetables.  °Fruits °All fresh, canned (in natural juice), or frozen fruits. °Meat and Other Protein Products °Ground beef (85% or leaner), grass-fed beef, or beef trimmed of fat. Skinless chicken or turkey. Ground chicken or turkey. Pork trimmed of fat. All fish and seafood. Eggs. Dried beans, peas, or lentils. Unsalted nuts and seeds. Unsalted canned beans. °Dairy °Low-fat dairy products, such as skim or 1% milk, 2% or reduced-fat cheeses, low-fat ricotta or cottage cheese, or plain low-fat yogurt. Low-sodium or reduced-sodium cheeses. °Fats and Oils °Tub margarines without trans fats. Light or reduced-fat mayonnaise and salad dressings (reduced sodium). Avocado. Safflower, olive, or canola oils. Natural peanut or almond butter. °Other °Unsalted popcorn and pretzels. °The items listed above may not be a complete list of recommended foods or beverages. Contact your dietitian for more options. °WHAT FOODS ARE NOT RECOMMENDED? °Grains °White bread. White pasta. White rice. Refined cornbread. Bagels and croissants. Crackers that contain trans fat. °Vegetables °Creamed or fried vegetables. Vegetables in a cheese sauce. Regular canned vegetables. Regular canned tomato sauce and paste. Regular tomato and vegetable juices. °Fruits °Dried fruits. Canned fruit in light or heavy syrup. Fruit juice. °Meat and Other Protein Products °Fatty cuts of meat. Ribs, chicken wings, bacon, sausage, bologna, salami, chitterlings, fatback, hot dogs, bratwurst, and packaged luncheon meats. Salted nuts and seeds. Canned beans with salt. °Dairy °Whole or 2% milk, cream, half-and-half, and cream cheese. Whole-fat or sweetened yogurt. Full-fat   cheeses or blue cheese. Nondairy creamers and whipped toppings. Processed cheese, cheese spreads, or cheese  curds. °Condiments °Onion and garlic salt, seasoned salt, table salt, and sea salt. Canned and packaged gravies. Worcestershire sauce. Tartar sauce. Barbecue sauce. Teriyaki sauce. Soy sauce, including reduced sodium. Steak sauce. Fish sauce. Oyster sauce. Cocktail sauce. Horseradish. Ketchup and mustard. Meat flavorings and tenderizers. Bouillon cubes. Hot sauce. Tabasco sauce. Marinades. Taco seasonings. Relishes. °Fats and Oils °Butter, stick margarine, lard, shortening, ghee, and bacon fat. Coconut, palm kernel, or palm oils. Regular salad dressings. °Other °Pickles and olives. Salted popcorn and pretzels. °The items listed above may not be a complete list of foods and beverages to avoid. Contact your dietitian for more information. °WHERE CAN I FIND MORE INFORMATION? °National Heart, [employer], and [employer] Institute: www.nhlbi.nih.gov/health/health-topics/topics/dash/ °Document Released: 06/13/2011 Document Revised: 11/08/2013 Document Reviewed: 04/28/2013 °ExitCare® Patient Information ©2015 [employer]. This information is not intended to replace advice given to you by your health care provider. Make sure you discuss any questions you have with your health care provider. ° °

## 2014-10-24 NOTE — Progress Notes (Signed)
MRN: [id number] Name: Tyrone Mcdonald  Sex: male Age: 64 y.o. DOB: 05-08-1951  Allergies: Review of patient's allergies indicates no known allergies.  Chief Complaint  Patient presents with  . Follow-up    HPI: Patient is 64 y.o. male who has history of hypertension, recently went to the emergency room with the symptoms of headache nasal congestion, EMR reviewed her patient was given headache cocktail his symptoms improved, still come nasal congestion denies any sore throat cough fever chills patient denies smoking cigarettes, initially his blood pressure was elevated, repeat manual blood pressure is 130/80. Patient is currently taking amlodipine 10 mg daily. Also previous blood work noticed with low vitamin D as per patient he has taken once a week dosage.  Past Medical History  Diagnosis Date  . Hypertension   . Chronic back pain   . Sciatica     History reviewed. No pertinent past surgical history.    Medication List       This list is accurate as of: 10/24/14 10:48 AM.  Always use your most recent med list.               albuterol 108 (90 BASE) MCG/ACT inhaler  Commonly known as:  PROVENTIL HFA;VENTOLIN HFA  Inhale into the lungs every 6 (six) hours as needed for wheezing or shortness of breath.     amLODipine 5 MG tablet  Commonly known as:  NORVASC  Take 5 mg by mouth daily.     cyclobenzaprine 10 MG tablet  Commonly known as:  FLEXERIL  Take 10 mg by mouth 2 (two) times daily as needed for muscle spasms.     fluticasone 50 MCG/ACT nasal spray  Commonly known as:  FLONASE  Place 2 sprays into both nostrils daily.     gabapentin 300 MG capsule  Commonly known as:  NEURONTIN  Take 1 capsule (300 mg total) by mouth 2 (two) times daily.     levofloxacin 750 MG tablet  Commonly known as:  LEVAQUIN  Take 1 tablet (750 mg total) by mouth daily. For the next 4 days (start 07/25/2014)     methocarbamol 750 MG tablet  Commonly known as:  ROBAXIN-750  Take 1  tablet (750 mg total) by mouth every 6 (six) hours as needed for muscle spasms.     methocarbamol 500 MG tablet  Commonly known as:  ROBAXIN  Take 1 tablet (500 mg total) by mouth every 8 (eight) hours as needed for muscle spasms.     naproxen 500 MG tablet  Commonly known as:  NAPROSYN  Take 1 tablet (500 mg total) by mouth 2 (two) times daily.     Vitamin D (Ergocalciferol) 50000 UNITS Caps capsule  Commonly known as:  DRISDOL  Take 1 capsule (50,000 Units total) by mouth every 7 (seven) days.        Meds ordered this encounter  Medications  . fluticasone (FLONASE) 50 MCG/ACT nasal spray    Sig: Place 2 sprays into both nostrils daily.    Dispense:  16 g    Refill:  3    Immunization History  Administered Date(s) Administered  . Influenza,inj,Quad PF,36+ Mos 07/25/2014    Family History  Problem Relation Age of Onset  . Hyperlipidemia Father   . Cancer Brother   . Diabetes Paternal Uncle     History  Substance Use Topics  . Smoking status: Never Smoker   . Smokeless tobacco: Not on file  . Alcohol Use: No  Review of Systems   As noted in HPI  Filed Vitals:   10/24/14 1036  BP: 130/80  Pulse:   Temp:   Resp:     Physical Exam  Physical Exam  HENT:   Some nasal congestion no sinus tenderness  Eyes: EOM are normal. Pupils are equal, round, and reactive to light.  Cardiovascular: Normal rate and regular rhythm.   Pulmonary/Chest: Breath sounds normal. No respiratory distress. He has no wheezes. He has no rales.  Musculoskeletal: He exhibits no edema.    CBC    Component Value Date/Time   WBC 5.7 09/30/2014 0102   RBC 5.07 09/30/2014 0102   HGB 13.3 09/30/2014 0102   HCT 41.0 09/30/2014 0102   PLT 172 09/30/2014 0102   MCV 80.9 09/30/2014 0102   LYMPHSABS 2.6 09/30/2014 0102   MONOABS 0.5 09/30/2014 0102   EOSABS 0.4 09/30/2014 0102   BASOSABS 0.0 09/30/2014 0102    CMP     Component Value Date/Time   NA 136 09/30/2014 0102   K 3.6  09/30/2014 0102   CL 102 09/30/2014 0102   CO2 26 09/30/2014 0102   GLUCOSE 115* 09/30/2014 0102   BUN 25* 09/30/2014 0102   CREATININE 1.58* 09/30/2014 0102   CREATININE 1.33 07/25/2014 0957   CALCIUM 9.3 09/30/2014 0102   PROT 6.9 07/25/2014 0957   ALBUMIN 3.9 07/25/2014 0957   AST 12 07/25/2014 0957   ALT <8 07/25/2014 0957   ALKPHOS 106 07/25/2014 0957   BILITOT 0.3 07/25/2014 0957   GFRNONAA 44* 09/30/2014 0102   GFRNONAA 56* 07/25/2014 0957   GFRAA 51* 09/30/2014 0102   GFRAA 64 07/25/2014 0957    Lab Results  Component Value Date/Time   CHOL 172 07/25/2014 09:57 AM    Lab Results  Component Value Date/Time   HGBA1C 5.8* 07/25/2014 09:57 AM    Lab Results  Component Value Date/Time   AST 12 07/25/2014 09:57 AM    Assessment and Plan  Essential hypertension Manual blood pressure is 130/80, continue with  Amlodipine, advise patient for DASH diet, repeat blood chemistry on the following visit  Vitamin D deficiency Continue with vitamin D supplement.  History of headache Now improved, Tylenol when necessary  Allergic rhinitis, unspecified allergic rhinitis type - Plan: fluticasone (FLONASE) 50 MCG/ACT nasal spray   Return in about 3 months (around 01/23/2015) for hypertension.   This note has been created with Education officer, environmental. Any transcriptional errors are unintentional.    Doris Cheadle, MD

## 2014-10-27 ENCOUNTER — Other Ambulatory Visit: Payer: Self-pay | Admitting: Internal Medicine

## 2014-12-26 ENCOUNTER — Ambulatory Visit: Payer: Self-pay | Attending: Internal Medicine

## 2015-02-17 ENCOUNTER — Ambulatory Visit: Payer: Medicare Other | Attending: Internal Medicine

## 2015-04-04 ENCOUNTER — Ambulatory Visit: Payer: Medicare Other | Attending: Family Medicine

## 2015-04-04 DIAGNOSIS — Z0000 Encounter for general adult medical examination without abnormal findings: Secondary | ICD-10-CM

## 2015-04-04 DIAGNOSIS — Z23 Encounter for immunization: Secondary | ICD-10-CM | POA: Insufficient documentation

## 2015-04-04 NOTE — Progress Notes (Signed)
Patient here for flu shot

## 2015-05-09 ENCOUNTER — Emergency Department (HOSPITAL_COMMUNITY)
Admission: EM | Admit: 2015-05-09 | Discharge: 2015-05-09 | Disposition: A | Discharge status: Home / Self Care | Payer: Self-pay | Attending: Emergency Medicine | Admitting: Emergency Medicine

## 2015-05-09 ENCOUNTER — Encounter (HOSPITAL_COMMUNITY): Payer: Self-pay | Admitting: Emergency Medicine

## 2015-05-09 ENCOUNTER — Emergency Department (HOSPITAL_COMMUNITY): Payer: Self-pay

## 2015-05-09 DIAGNOSIS — G8929 Other chronic pain: Secondary | ICD-10-CM | POA: Insufficient documentation

## 2015-05-09 DIAGNOSIS — M5440 Lumbago with sciatica, unspecified side: Secondary | ICD-10-CM | POA: Insufficient documentation

## 2015-05-09 DIAGNOSIS — M79605 Pain in left leg: Secondary | ICD-10-CM | POA: Insufficient documentation

## 2015-05-09 DIAGNOSIS — M542 Cervicalgia: Secondary | ICD-10-CM | POA: Insufficient documentation

## 2015-05-09 DIAGNOSIS — M79604 Pain in right leg: Secondary | ICD-10-CM | POA: Insufficient documentation

## 2015-05-09 DIAGNOSIS — I10 Essential (primary) hypertension: Secondary | ICD-10-CM | POA: Insufficient documentation

## 2015-05-09 DIAGNOSIS — M5430 Sciatica, unspecified side: Secondary | ICD-10-CM

## 2015-05-09 DIAGNOSIS — R200 Anesthesia of skin: Secondary | ICD-10-CM | POA: Insufficient documentation

## 2015-05-09 DIAGNOSIS — Z79899 Other long term (current) drug therapy: Secondary | ICD-10-CM | POA: Insufficient documentation

## 2015-05-09 LAB — CBC WITH DIFFERENTIAL/PLATELET
BASOS ABS: 0 10*3/uL (ref 0.0–0.1)
Basophils Relative: 1 %
EOS ABS: 0.4 10*3/uL (ref 0.0–0.7)
EOS PCT: 7 %
HCT: 39.2 % (ref 39.0–52.0)
Hemoglobin: 12.6 g/dL — ABNORMAL LOW (ref 13.0–17.0)
LYMPHS ABS: 2.5 10*3/uL (ref 0.7–4.0)
Lymphocytes Relative: 42 %
MCH: 26.3 pg (ref 26.0–34.0)
MCHC: 32.1 g/dL (ref 30.0–36.0)
MCV: 81.7 fL (ref 78.0–100.0)
MONO ABS: 0.4 10*3/uL (ref 0.1–1.0)
Monocytes Relative: 7 %
Neutro Abs: 2.6 10*3/uL (ref 1.7–7.7)
Neutrophils Relative %: 43 %
PLATELETS: 152 10*3/uL (ref 150–400)
RBC: 4.8 MIL/uL (ref 4.22–5.81)
RDW: 13 % (ref 11.5–15.5)
WBC: 5.9 10*3/uL (ref 4.0–10.5)

## 2015-05-09 LAB — BASIC METABOLIC PANEL
Anion gap: 9 (ref 5–15)
BUN: 20 mg/dL (ref 6–20)
CALCIUM: 9 mg/dL (ref 8.9–10.3)
CO2: 26 mmol/L (ref 22–32)
CREATININE: 1.39 mg/dL — AB (ref 0.61–1.24)
Chloride: 106 mmol/L (ref 101–111)
GFR calc Af Amer: 60 mL/min — ABNORMAL LOW (ref >60)
GFR, EST NON AFRICAN AMERICAN: 52 mL/min — AB (ref >60)
Glucose, Bld: 95 mg/dL (ref 65–99)
POTASSIUM: 3.6 mmol/L (ref 3.5–5.1)
SODIUM: 141 mmol/L (ref 135–145)

## 2015-05-09 MED ORDER — KETOROLAC TROMETHAMINE 60 MG/2ML IM SOLN
30.0000 mg | Freq: Once | INTRAMUSCULAR | Status: AC
  Administered 2015-05-09: 30 mg via INTRAMUSCULAR
  Filled 2015-05-09: qty 2

## 2015-05-09 MED ORDER — DIAZEPAM 5 MG PO TABS: 5.0000 mg | ORAL_TABLET | Freq: Two times a day (BID) | ORAL | Status: DC

## 2015-05-09 MED ORDER — DIAZEPAM 5 MG PO TABS
5.0000 mg | ORAL_TABLET | Freq: Once | ORAL | Status: AC
  Administered 2015-05-09: 5 mg via ORAL
  Filled 2015-05-09: qty 1

## 2015-05-09 NOTE — ED Notes (Signed)
Pt returned from MRI °

## 2015-05-09 NOTE — ED Provider Notes (Signed)
CSN: [id number]     Arrival date & time 05/09/15  0426 History   First MD Initiated Contact with Patient 05/09/15 0436     Chief Complaint  Patient presents with  . Back Pain  . Leg Pain  . Neck Pain     (Consider location/radiation/quality/duration/timing/severity/associated sxs/prior Treatment) Patient is a 64 y.o. male presenting with back pain, leg pain, and neck pain. The history is provided by the patient and the spouse.  Back Pain Location:  Lumbar spine Quality:  Aching Radiates to:  Does not radiate Pain severity:  Severe Onset quality:  Gradual Duration:  2 months Timing:  Constant Progression:  Waxing and waning Chronicity:  Recurrent Relieved by:  Nothing Worsened by:  Nothing tried Ineffective treatments:  None tried Associated symptoms: leg pain and numbness (cant feel left foot, feeling like he will fall)   Associated symptoms: no abdominal pain, no bladder incontinence, no bowel incontinence, no chest pain, no fever, no headaches and no perianal numbness   Leg Pain Associated symptoms: back pain and neck pain   Associated symptoms: no fever   Neck Pain Associated symptoms: leg pain and numbness (cant feel left foot, feeling like he will fall)   Associated symptoms: no bladder incontinence, no bowel incontinence, no chest pain, no fever and no headaches    64 yo M with a chief complaint of low back pain. This been an ongoing issue for this patient. He's been seen multiple times in the emergency department for the same. Patient had sudden worsening while he was getting up to use the bathroom this morning. Stated that the pain now radiates down to both legs. He is not tried anything for this pain at home. Denies loss of bowel or bladder denies loss of perirectal sensation. States that he can't feel the bottom of his left foot. Was having difficulty walking back to the bed. He felt that the pain was so severe that he needed urgent medical attention. Difficulty obtaining  history secondary to language barrier.  Past Medical History  Diagnosis Date  . Hypertension   . Chronic back pain   . Sciatica    History reviewed. No pertinent past surgical history. Family History  Problem Relation Age of Onset  . Hyperlipidemia Father   . Cancer Brother   . Diabetes Paternal Uncle    Social History  Substance Use Topics  . Smoking status: Never Smoker   . Smokeless tobacco: None  . Alcohol Use: No    Review of Systems  Constitutional: Negative for fever and chills.  HENT: Negative for congestion and facial swelling.   Eyes: Negative for discharge and visual disturbance.  Respiratory: Negative for shortness of breath.   Cardiovascular: Negative for chest pain and palpitations.  Gastrointestinal: Negative for vomiting, abdominal pain, diarrhea and bowel incontinence.  Genitourinary: Negative for bladder incontinence.  Musculoskeletal: Positive for back pain and neck pain. Negative for myalgias and arthralgias.  Skin: Negative for color change and rash.  Neurological: Positive for numbness (cant feel left foot, feeling like he will fall). Negative for tremors, syncope and headaches.  Psychiatric/Behavioral: Negative for confusion and dysphoric mood.      Allergies  Review of patient's allergies indicates no known allergies.  Home Medications   Prior to Admission medications   Medication Sig Start Date End Date Taking? Authorizing Provider  albuterol (PROVENTIL HFA;VENTOLIN HFA) 108 (90 BASE) MCG/ACT inhaler Inhale into the lungs every 6 (six) hours as needed for wheezing or shortness of breath.  Yes Historical Provider, MD  amLODipine (NORVASC) 5 MG tablet Take 5 mg by mouth daily.   Yes Historical Provider, MD  cyclobenzaprine (FLEXERIL) 10 MG tablet Take 10 mg by mouth 2 (two) times daily as needed for muscle spasms.   Yes Historical Provider, MD  diazepam (VALIUM) 5 MG tablet Take 1 tablet (5 mg total) by mouth 2 (two) times daily. 05/09/15   Melene Plan, DO   BP 130/69 mmHg  Pulse 81  Temp(Src) 97.9 F (36.6 C)  Resp 18  SpO2 98% Physical Exam  Constitutional: He is oriented to person, place, and time. He appears well-developed and well-nourished.  HENT:  Head: Normocephalic and atraumatic.  Eyes: EOM are normal. Pupils are equal, round, and reactive to light.  Neck: Normal range of motion. Neck supple. No JVD present.  Cardiovascular: Normal rate and regular rhythm.  Exam reveals no gallop and no friction rub.   No murmur heard. Pulmonary/Chest: No respiratory distress. He has no wheezes.  Abdominal: He exhibits no distension. There is no rebound and no guarding.  Musculoskeletal: Normal range of motion.  Neurological: He is alert and oriented to person, place, and time. He displays no Babinski's sign on the right side. He displays no Babinski's sign on the left side.  Reflex Scores:      Tricep reflexes are 2+ on the right side and 2+ on the left side.      Bicep reflexes are 2+ on the right side and 2+ on the left side.      Brachioradialis reflexes are 2+ on the right side and 2+ on the left side.      Patellar reflexes are 2+ on the right side and 2+ on the left side.      Achilles reflexes are 2+ on the right side and 2+ on the left side. No noted clonus. Patient states that he can feel the plantar aspect of his left foot. Patient actively moves that way when palpated the bottom of the foot. Patient states that he cannot feel that.  Intact lower and upper extremity strength bilaterally 5 out of 5.  Skin: No rash noted. No pallor.  Psychiatric: He has a normal mood and affect. His behavior is normal.  Nursing note and vitals reviewed.   ED Course  Procedures (including critical care time) Labs Review Labs Reviewed  CBC WITH DIFFERENTIAL/PLATELET - Abnormal; Notable for the following:    Hemoglobin 12.6 (*)    All other components within normal limits  BASIC METABOLIC PANEL - Abnormal; Notable for the following:     Creatinine, Ser 1.39 (*)    GFR calc non Af Amer 52 (*)    GFR calc Af Amer 60 (*)    All other components within normal limits    Imaging Review Mr Lumbar Spine Wo Contrast  05/09/2015  CLINICAL DATA:  Initial evaluation for chronic back pain with radiation into lower extremities. EXAM: MRI LUMBAR SPINE WITHOUT CONTRAST TECHNIQUE: Multiplanar, multisequence MR imaging of the lumbar spine was performed. No intravenous contrast was administered. COMPARISON:  Prior radiograph from 02/27/2014. FINDINGS: For the purposes of this dictation, the lowest well-formed intervertebral disc spaces presumed to be the L5-S1 level, and there presumed to be 5 lumbar type vertebral bodies. Mild straightening of the normal cervical lordosis. Vertebral bodies are otherwise normally aligned with preservation of the normal lumbar lordosis. There is mild chronic height loss with associated degenerative endplate changes at the superior endplate of L1 with associated Schmorl's node.  No acute fracture or listhesis. Signal intensity within the vertebral body bone marrow within normal limits. No focal osseous lesion. No marrow edema. Conus medullaris terminates normally at the L1 level. Signal intensity within the visualized cord is normal. Nerve roots of the cauda equina within normal limits. Paraspinous soft tissues demonstrate no acute abnormality. Multiple T2 hyperintense cyst present within the partially visualized kidneys. T10-11 and T11-12, seen only on sagittal projection. Levels appear negative without disc pathology or stenosis. T12-L1: Annular disc bulge with disc desiccation. Superimposed small left foraminal disc protrusion present (series 6, image 2). Protruding disc closely approximates the exiting left T12 nerve root without neural impingement or displacement. Central canal widely patent. No significant foraminal stenosis. L1-2:  Negative. L2-3:  Negative. L3-4: Diffuse disc bulge with disc desiccation. Small annular  fissure noted posteriorly. Ligamentum flavum hypertrophy. Resultant mild canal stenosis. There is resultant mild bilateral foraminal stenosis, left slightly greater than right. The bulging disc closely approximates the exiting L3 nerve roots bilaterally, slightly greater on the left (series 4, image 11). L4-5: Diffuse disc bulge with disc desiccation. Small annular fissure posteriorly. Superimposed shallow right foraminal disc protrusion contacting the exiting right L4 nerve root (series 4, image 3). Mild facet and ligamentous hypertrophy. Resultant mild to moderate canal and lateral recess stenosis bilaterally. Mild left foraminal narrowing. L5-S1: Degenerative disc bulge with disc desiccation and intervertebral disc space narrowing. Broad-based central disc protrusion with associated annular fissure. The protruding disc closely approximates the transiting S1 nerve roots in the lateral recess bilaterally. No frank neural impingement or displacement. Superimposed mild facet hypertrophy. Foramina remain widely patent. IMPRESSION: 1. Broad-based central disc protrusion at L5-S1, closely approximating the transiting S1 nerve roots bilaterally in the lateral recesses. 2. Degenerative disc bulge with superimposed shallow right foraminal disc protrusion at L4-5, abutting the exiting right L4 nerve root as it courses out of the right neural foramen. 3. Degenerative disc bulge at L3-4 with resultant left greater than right mild foraminal narrowing. Bulging disc closely approximates the exiting left L3 nerve root in the left neural foramen without frank neural impingement or displacement. 4. Probable small left foraminal disc protrusion at T12-L1 without significant stenosis or neural impingement. 5. Chronic height loss at the superior endplate of L1 without bony retropulsion. No acute fracture within the lumbar spine. Electronically Signed   By: Rise Mu M.D.   On: 05/09/2015 06:59   I have personally reviewed  and evaluated these images and lab results as part of my medical decision-making.   EKG Interpretation None      MDM   Final diagnoses:  Sciatica associated with disorder of lumbar spine, unspecified laterality    64 yo M with a chief complaint of low back pain. This radiates down both of his legs. Patient also has subjective loss of sensation to the bottom of his left foot. Multiple red flags obtain an MRI. Patient declining rectal exam.  MRI was some stenosis, worst at L5-S1. Patient reassessed and feeling much better after Toradol and Valium. Able to ambulate on the ED without difficulty. We'll have him follow with neurosurgery as well as his PCP. Discussed use of NSAIDs. Post void residual 87.  Feel unlikely cauda equina.   7:14 AM:  I have discussed the diagnosis/risks/treatment options with the patient and family and believe the pt to be eligible for discharge home to follow-up with PCP, neurosurgery. We also discussed returning to the ED immediately if new or worsening sx occur. We discussed the sx which  are most concerning (e.g., sudden worsening pain, cauda equina) that necessitate immediate return. Medications administered to the patient during their visit and any new prescriptions provided to the patient are listed below.  Medications given during this visit Medications  ketorolac (TORADOL) injection 30 mg (30 mg Intramuscular Given 05/09/15 0525)  diazepam (VALIUM) tablet 5 mg (5 mg Oral Given 05/09/15 0525)    New Prescriptions   DIAZEPAM (VALIUM) 5 MG TABLET    Take 1 tablet (5 mg total) by mouth 2 (two) times daily.    The patient appears reasonably screen and/or stabilized for discharge and I doubt any other medical condition or other [facility] requiring further screening, evaluation, or treatment in the ED at this time prior to discharge.    Melene Plan, DO 05/09/15 [phone]

## 2015-05-09 NOTE — Discharge Instructions (Signed)
Take 4 over the counter ibuprofen tablets 3 times a day or 2 over-the-counter naproxen tablets twice a day for pain. ° °Sciatica °Sciatica is pain, weakness, numbness, or tingling along your sciatic nerve. The nerve starts in the lower back and runs down the back of each leg. Nerve damage or certain conditions pinch or put pressure on the sciatic nerve. This causes the pain, weakness, and other discomforts of sciatica. °HOME CARE  °· Only take medicine as told by your doctor. °· Apply ice to the affected area for 20 minutes. Do this 3-4 times a day for the first 48-72 hours. Then try heat in the same way. °· Exercise, stretch, or do your usual activities if these do not make your pain worse. °· Go to physical therapy as told by your doctor. °· Keep all doctor visits as told. °· Do not wear high heels or shoes that are not supportive. °· Get a firm mattress if your mattress is too soft to lessen pain and discomfort. °GET HELP RIGHT AWAY IF:  °· You cannot control when you poop (bowel movement) or pee (urinate). °· You have more weakness in your lower back, lower belly (pelvis), butt (buttocks), or legs. °· You have redness or puffiness (swelling) of your back. °· You have a burning feeling when you pee. °· You have pain that gets worse when you lie down. °· You have pain that wakes you from your sleep. °· Your pain is worse than past pain. °· Your pain lasts longer than 4 weeks. °· You are suddenly losing weight without reason. °MAKE SURE YOU:  °· Understand these instructions. °· Will watch this condition. °· Will get help right away if you are not doing well or get worse. °  °This information is not intended to replace advice given to you by your health care provider. Make sure you discuss any questions you have with your health care provider. °  °Document Released: 04/02/2008 Document Revised: 03/15/2015 Document Reviewed: 11/03/2011 °[employer]vier Interactive Patient Education ©2016 [employer] ° °

## 2015-05-09 NOTE — ED Notes (Signed)
Pt c/o bilateral leg pain, back and neck pain that started PTA. No known injury. RN has to continuously repeat questions to obtain an answer from patient.

## 2015-05-09 NOTE — ED Notes (Signed)
Patient transported to MRI 

## 2015-05-09 NOTE — ED Notes (Signed)
Translator used for patient discharge

## 2015-08-13 ENCOUNTER — Encounter (HOSPITAL_COMMUNITY): Payer: Self-pay | Admitting: Nurse Practitioner

## 2015-08-13 ENCOUNTER — Emergency Department (HOSPITAL_COMMUNITY): Payer: Medicare Other

## 2015-08-13 ENCOUNTER — Emergency Department (HOSPITAL_COMMUNITY)
Admission: EM | Admit: 2015-08-13 | Discharge: 2015-08-13 | Disposition: A | Discharge status: Home / Self Care | Payer: Self-pay | Attending: Physician Assistant | Admitting: Physician Assistant

## 2015-08-13 DIAGNOSIS — Z79899 Other long term (current) drug therapy: Secondary | ICD-10-CM | POA: Insufficient documentation

## 2015-08-13 DIAGNOSIS — Y998 Other external cause status: Secondary | ICD-10-CM | POA: Insufficient documentation

## 2015-08-13 DIAGNOSIS — I10 Essential (primary) hypertension: Secondary | ICD-10-CM | POA: Insufficient documentation

## 2015-08-13 DIAGNOSIS — M25561 Pain in right knee: Secondary | ICD-10-CM | POA: Insufficient documentation

## 2015-08-13 DIAGNOSIS — S239XXA Sprain of unspecified parts of thorax, initial encounter: Secondary | ICD-10-CM

## 2015-08-13 DIAGNOSIS — X501XXA Overexertion from prolonged static or awkward postures, initial encounter: Secondary | ICD-10-CM | POA: Insufficient documentation

## 2015-08-13 DIAGNOSIS — Y9389 Activity, other specified: Secondary | ICD-10-CM | POA: Insufficient documentation

## 2015-08-13 DIAGNOSIS — M25562 Pain in left knee: Secondary | ICD-10-CM | POA: Insufficient documentation

## 2015-08-13 DIAGNOSIS — G8929 Other chronic pain: Secondary | ICD-10-CM | POA: Insufficient documentation

## 2015-08-13 DIAGNOSIS — S335XXA Sprain of ligaments of lumbar spine, initial encounter: Secondary | ICD-10-CM | POA: Insufficient documentation

## 2015-08-13 DIAGNOSIS — R0602 Shortness of breath: Secondary | ICD-10-CM | POA: Insufficient documentation

## 2015-08-13 DIAGNOSIS — Y9289 Other specified places as the place of occurrence of the external cause: Secondary | ICD-10-CM | POA: Insufficient documentation

## 2015-08-13 LAB — BASIC METABOLIC PANEL
Anion gap: 9 (ref 5–15)
BUN: 18 mg/dL (ref 6–20)
CO2: 25 mmol/L (ref 22–32)
Calcium: 9.5 mg/dL (ref 8.9–10.3)
Chloride: 107 mmol/L (ref 101–111)
Creatinine, Ser: 1.21 mg/dL (ref 0.61–1.24)
GFR calc Af Amer: >60 mL/min (ref >60)
GFR calc non Af Amer: >60 mL/min (ref >60)
Glucose, Bld: 135 mg/dL — ABNORMAL HIGH (ref 65–99)
Potassium: 3.7 mmol/L (ref 3.5–5.1)
Sodium: 141 mmol/L (ref 135–145)

## 2015-08-13 LAB — CBC WITH DIFFERENTIAL/PLATELET
BASOS ABS: 0 10*3/uL (ref 0.0–0.1)
BASOS PCT: 1 %
EOS ABS: 0.3 10*3/uL (ref 0.0–0.7)
Eosinophils Relative: 8 %
HEMATOCRIT: 45.8 % (ref 39.0–52.0)
HEMOGLOBIN: 15.2 g/dL (ref 13.0–17.0)
LYMPHS ABS: 1.5 10*3/uL (ref 0.7–4.0)
Lymphocytes Relative: 35 %
MCH: 27 pg (ref 26.0–34.0)
MCHC: 33.2 g/dL (ref 30.0–36.0)
MCV: 81.5 fL (ref 78.0–100.0)
Monocytes Absolute: 0.3 10*3/uL (ref 0.1–1.0)
Monocytes Relative: 6 %
NEUTROS PCT: 50 %
Neutro Abs: 2.2 10*3/uL (ref 1.7–7.7)
Platelets: 120 10*3/uL — ABNORMAL LOW (ref 150–400)
RBC: 5.62 MIL/uL (ref 4.22–5.81)
RDW: 13.2 % (ref 11.5–15.5)
WBC: 4.2 10*3/uL (ref 4.0–10.5)

## 2015-08-13 LAB — BRAIN NATRIURETIC PEPTIDE: B Natriuretic Peptide: 19.7 pg/mL (ref 0.0–100.0)

## 2015-08-13 LAB — TROPONIN I: Troponin I: <0.03 ng/mL (ref <0.031)

## 2015-08-13 MED ORDER — CYCLOBENZAPRINE HCL 10 MG PO TABS: 10.0000 mg | ORAL_TABLET | Freq: Two times a day (BID) | ORAL | Status: DC | PRN

## 2015-08-13 MED ORDER — IBUPROFEN 800 MG PO TABS: 800.0000 mg | ORAL_TABLET | Freq: Three times a day (TID) | ORAL | Status: DC

## 2015-08-13 MED ORDER — IBUPROFEN 800 MG PO TABS: 800.0000 mg | ORAL_TABLET | Freq: Once | ORAL | Status: DC

## 2015-08-13 NOTE — Discharge Instructions (Signed)
Back Exercises °The following exercises strengthen the muscles that help to support the back. They also help to keep the lower back flexible. Doing these exercises can help to prevent back pain or lessen existing pain. °If you have back pain or discomfort, try doing these exercises 2-3 times each day or as told by your health care provider. When the pain goes away, do them once each day, but increase the number of times that you repeat the steps for each exercise (do more repetitions). If you do not have back pain or discomfort, do these exercises once each day or as told by your health care provider. °EXERCISES °Single Knee to Chest °Repeat these steps 3-5 times for each leg: °1. Lie on your back on a firm bed or the floor with your legs extended. °2. Bring one knee to your chest. Your other leg should stay extended and in contact with the floor. °3. Hold your knee in place by grabbing your knee or thigh. °4. Pull on your knee until you feel a gentle stretch in your lower back. °5. Hold the stretch for 10-30 seconds. °6. Slowly release and straighten your leg. °Pelvic Tilt °Repeat these steps 5-10 times: °1. Lie on your back on a firm bed or the floor with your legs extended. °2. Bend your knees so they are pointing toward the ceiling and your feet are flat on the floor. °3. Tighten your lower abdominal muscles to press your lower back against the floor. This motion will tilt your pelvis so your tailbone points up toward the ceiling instead of pointing to your feet or the floor. °4. With gentle tension and even breathing, hold this position for 5-10 seconds. °Cat-Cow °Repeat these steps until your lower back becomes more flexible: °1. Get into a hands-and-knees position on a firm surface. Keep your hands under your shoulders, and keep your knees under your hips. You may place padding under your knees for comfort. °2. Let your head hang down, and point your tailbone toward the floor so your lower back becomes  rounded like the back of a cat. °3. Hold this position for 5 seconds. °4. Slowly lift your head and point your tailbone up toward the ceiling so your back forms a sagging arch like the back of a cow. °5. Hold this position for 5 seconds. °Press-Ups °Repeat these steps 5-10 times: °1. Lie on your abdomen (face-down) on the floor. °2. Place your palms near your head, about shoulder-width apart. °3. While you keep your back as relaxed as possible and keep your hips on the floor, slowly straighten your arms to raise the top half of your body and lift your shoulders. Do not use your back muscles to raise your upper torso. You may adjust the placement of your hands to make yourself more comfortable. °4. Hold this position for 5 seconds while you keep your back relaxed. °5. Slowly return to lying flat on the floor. °Bridges °Repeat these steps 10 times: °1. Lie on your back on a firm surface. °2. Bend your knees so they are pointing toward the ceiling and your feet are flat on the floor. °3. Tighten your buttocks muscles and lift your buttocks off of the floor until your waist is at almost the same height as your knees. You should feel the muscles working in your buttocks and the back of your thighs. If you do not feel these muscles, slide your feet 1-2 inches farther away from your buttocks. °4. Hold this position for 3-5   seconds. 5. Slowly lower your hips to the starting position, and allow your buttocks muscles to relax completely. If this exercise is too easy, try doing it with your arms crossed over your chest. Abdominal Crunches Repeat these steps 5-10 times: 1. Lie on your back on a firm bed or the floor with your legs extended. 2. Bend your knees so they are pointing toward the ceiling and your feet are flat on the floor. 3. Cross your arms over your chest. 4. Tip your chin slightly toward your chest without bending your neck. 5. Tighten your abdominal muscles and slowly raise your trunk (torso) high  enough to lift your shoulder blades a tiny bit off of the floor. Avoid raising your torso higher than that, because it can put too much stress on your low back and it does not help to strengthen your abdominal muscles. 6. Slowly return to your starting position. Back Lifts Repeat these steps 5-10 times: 1. Lie on your abdomen (face-down) with your arms at your sides, and rest your forehead on the floor. 2. Tighten the muscles in your legs and your buttocks. 3. Slowly lift your chest off of the floor while you keep your hips pressed to the floor. Keep the back of your head in line with the curve in your back. Your eyes should be looking at the floor. 4. Hold this position for 3-5 seconds. 5. Slowly return to your starting position. SEEK MEDICAL CARE IF:  Your back pain or discomfort gets much worse when you do an exercise.  Your back pain or discomfort does not lessen within 2 hours after you exercise. If you have any of these problems, stop doing these exercises right away. Do not do them again unless your health care provider says that you can. SEEK IMMEDIATE MEDICAL CARE IF:  You develop sudden, severe back pain. If this happens, stop doing the exercises right away. Do not do them again unless your health care provider says that you can.   This information is not intended to replace advice given to you by your health care provider. Make sure you discuss any questions you have with your health care provider.   Document Released: 08/01/2004 Document Revised: 03/15/2015 Document Reviewed: 08/18/2014 Elsevier Interactive Patient Education 2016 [employer].    Emergency Department Resource Guide 1) Find a Doctor and Pay Out of Pocket Although you won't have to find out who is covered by your insurance plan, it is a good idea to ask around and get recommendations. You will then need to call the office and see if the doctor you have chosen will accept you as a new patient and what types of  options they offer for patients who are self-pay. Some doctors offer discounts or will set up payment plans for their patients who do not have insurance, but you will need to ask so you aren't surprised when you get to your appointment.  2) Contact Your Local Health Department Not all health departments have doctors that can see patients for sick visits, but many do, so it is worth a call to see if yours does. If you don't know where your local health department is, you can check in your phone book. The CDC also has a tool to help you locate your state's health department, and many state websites also have listings of all of their local health departments.  3) Find a Walk-in Clinic If your illness is not likely to be very severe or complicated, you may  want to try a walk in clinic. These are popping up all over the country in pharmacies, drugstores, and shopping centers. They're usually staffed by nurse practitioners or physician assistants that have been trained to treat common illnesses and complaints. They're usually fairly quick and inexpensive. However, if you have serious medical issues or chronic medical problems, these are probably not your best option.  No Primary Care Doctor: - Call Health Connect at  [phone] - they can help you locate a primary care doctor that  accepts your insurance, provides certain services, etc. - Physician Referral Service- [phone]  Chronic Pain Problems: Organization         Address  Phone   Notes  [facility]  [phone] Patients need to be referred by their primary care doctor.   Medication Assistance: Organization         Address  Phone   Notes  [facility] Medication [facility] [address] [place] [place], [place] [address] [phone] --Must be a resident of [facility] -- Must have NO insurance coverage whatsoever (no Medicaid/ Medicare, etc.) -- The pt. MUST have a primary care doctor that directs  their care regularly and follows them in the community   [facility]  [phone]   [employer]  [phone]    Agencies that provide inexpensive medical care: Organization         Address  Phone   Notes  [facility]  [phone]   [facility]    [phone]   [facility] [address] [place], [place] [address] [phone]   [facility] [address] [place] [place] [phone]   Planned Parenthood    [phone]   [facility]    [phone]   [facility] and [facility]  [address], [place] Phone:  [phone], Fax:  [phone] Hours of Operation:  9 am - 6 pm, M-F.  Also accepts Medicaid/Medicare and self-pay.  [facility]  [address], [place] Phone: [phone], Fax: [phone]. Hours of Operation:  8:30 am - 5:30 pm, M-F.  Also accepts Medicaid and self-pay.  [facility] [place] [address], [place] Phone: [phone]   [facility] [address] [place] [place], [place] [phone], Ext. [phone] Mondays & Thursdays: 7-9 AM.  First 15 patients are seen on a first come, first serve basis.    Medicaid-accepting [facility] Providers:  Organization         Address  Phone   Notes  [facility] [address], [place] [phone] Also accepts self-pay patients.  [facility] [address] Laurell Josephs [place], [place]  [phone]   [facility] [address], [place] [phone]   [facility] [address], [place] [phone]   Renaye Rakers [address], [place]   [phone] Only accepts [place] Access [place] patients after they have their name applied to their card.   Self-Pay (no insurance) in [facility]:  Organization          Address  Phone   Notes  Sickle Cell Patients, [facility] [address] [place], [place] [phone]   [facility] Urgent Care [address] [place], [place] [phone])  [phone]   [facility] [place]  [address], [place] [phone]   [facility]/Dr. Osei-Bonsu  [address] [place] or [address], [place] [phone] Phone number for both [place] and [place] locations is the same.  [facility] [address] [place] [phone]   [facility] [address] [place] or [address] [phone]   [facility] [address], [place] [phone], phone; [phone], fax Sees patients 1st and 3rd Saturday of every month.  Must not qualify for public or private insurance (i.e. Medicaid, Medicare, [place] Health Choice, Veterans' Benefits)  Household income should be no more than 200% of the poverty level The clinic cannot treat you if you are pregnant or think you are pregnant  Sexually transmitted diseases are not treated at the clinic.    Dental Care: Organization         Address  Phone  Notes  [facility] Department of [facility] [place] [facility] [address] [place], [place] [phone] Accepts children up to age 65 who are enrolled in [place] or [place] Health Choice; pregnant women with a Medicaid card; and children who have applied for Medicaid or [place] Health Choice, but were declined, whose parents can pay a reduced fee at time of service.  [facility] Department of [facility]  [address], [place] [phone] Accepts children up to age 42 who are enrolled in [place] or [place] Health Choice; pregnant women with a Medicaid card; and children who have applied for Medicaid or [facility] Health Choice, but were declined, whose parents can pay a reduced fee at time of  service.  [facility]  [address] [place], [place] [phone] Patients are seen by appointment only. Walk-ins are not accepted. [facility] will see patients 84 years of age and older. Monday - Tuesday (8am-5pm) Most Wednesdays (8:30-5pm) $30 per visit, cash only  [facility] Adult Dental Access PRO[facility]  [address], [facility] [phone] Patients are seen by appointment only. Walk-ins are not accepted. [facility] will see patients 71 years of age and older. One Wednesday Evening (Monthly: Volunteer Based).  $30 per visit, cash only  [employer]  [phone] for adults; Children under age 49, call Graduate Pediatric Dentistry at [phone]. Children aged 80-14, please call [phone] to request a pediatric application.  Dental services are provided in all areas of dental care including fillings, crowns and bridges, complete and partial dentures, implants, gum treatment, root canals, and extractions. Preventive care is also provided. Treatment is provided to both adults and children. Patients are selected via a lottery and there is often a waiting list.   [facility] [address] [place]  [phone] [email or web address]   [facility] [address] [place], [place] [phone], Ext. [phone] Second and Fourth Thursday of each month, opens at 6:30 AM; Clinic ends at 9 AM.  Patients are seen on a first-come first-served basis, and a limited number are seen during each clinic.   [facility]  [address] Ether Griffins [place], [place] [phone]   Eligibility Requirements You must have lived in [place], [place], or [place] counties for at least the last three months.   You cannot be eligible for state or  Teacher, music, including [employer], [place], or [employer].   You generally cannot be eligible for healthcare insurance through your employer.     How to apply: Eligibility screenings are held every Tuesday and Wednesday afternoon from 1:00 pm until 4:00 pm. You do not need an appointment for the interview!  [facility] [address], [place], [place] [phone]   [facility] Health Department  [phone]   [facility] Health Department  [phone]   [facility] Health Department  [phone]    Behavioral Health Resources in the Community: Intensive Outpatient Programs Organization         Address  Phone  Notes  [facility] Services [address], [place], [place] [phone]   [facility] Outpatient [address], [place], [place] [phone]   [facility] [address], [place], [place]  [phone]   [facility] Mental Health [address]  [place], [place] [phone] or [phone]   Substance Abuse Resources Organization         Address  Phone  Notes  [facility]  [phone]   [facility]  [phone]   [facility] [place]  [phone]   Floydene Flock  [phone]   Residential & Outpatient Substance Abuse Program  [phone]   Psychological Services Organization         Address  Phone  Notes  [facility] Behavioral Health  [phone]   [facility]  [phone]   [facility] Mental Health [address], [place] [phone] or [phone]    Mobile Crisis Teams Organization         Address  Phone  Notes  [facility], Mobile Crisis Care Unit  [phone]   [facility]  [address] [place], [place] [phone]   Doristine Locks [address] [place] [phone]    Self-Help/Support Groups Organization         Address  Phone             Notes  [facility] [place] - variety of support groups  336- I7437963 Call for more information  Narcotics Anonymous (NA), [facility] [address]  Dr, [employer] [place]  2 meetings at this location   Statistician         Address  Phone  Notes  [facility] [address],    [place]  [phone]   [facility]  [address] [place] [address], [place], [place] [phone]   [facility] [address] [place], [place] [phone] Admissions: 8am-3pm M-F  [facility] [address]    [place], [place] [phone]   [facility] [address] Starling Manns [place], [place] [phone]   [facility] [address],  [place], [place] [phone]   [facility] - Intensive Outpatient [address]ure[address] Joseph[address], [place], [place] [phone]   [facility] ([facility] [address] [place]  [place], [place] [phone] or [phone]   [facility] ([facility]) [address], [place], [place] [phone] Accepts Medicaid  [facility] [address]  [place] [phone] Substance Abuse/Addiction Treatment   [facility] Organization         Address  Phone  Notes  [facility]  [phone]   Angie Fava, PhD [address] [place], [place]   (  [phone]) X3202989 or [phone]) [phone]   [facility]   [address] [place], [place] [phone]   [facility] [address] [place], [place] [phone] Insurance/Medicaid/sponsorship through [facility] and Families [address]                                    [place], [place] [phone] Therapy/tele-psych/case  [facility] [address]   [place], [place] [phone]    Dr. Lolly Mustache  [phone]   [facility]  [employer] [facility] Dept. 1) [address] [place] 2) [address] [place] 3)  [address], [place] [phone]  [phone]   [facility] Child Abuse  Hotline 224-053-1202 or [phone] (After Hours)       Back Exercises The following exercises strengthen the muscles that help to support the back. They also help to keep the lower back flexible. Doing these exercises can help to prevent back pain or lessen existing pain. If you have back pain or discomfort, try doing these exercises 2-3 times each day or as told by your health care provider. When the pain goes away, do them once each day, but increase the number of times that you repeat the steps for each exercise (do more repetitions). If you do not have back pain or discomfort, do these exercises once each day or as told by your health care provider. EXERCISES Single Knee to Chest Repeat these steps 3-5 times for each leg: 7. Lie on your back on a firm bed or the floor with your legs extended. 8. Bring one knee to your chest. Your other leg should stay extended and in contact with the floor. 9. Hold your knee in place by grabbing your knee or thigh. 10. Pull on your knee until you feel a gentle stretch in your lower back. 11. Hold the stretch for 10-30 seconds. 12. Slowly release and straighten your leg. Pelvic Tilt Repeat these steps 5-10 times: 5. Lie on your back on a firm bed or the floor with your legs extended. 6. Bend your knees so they are pointing toward the ceiling and your feet are flat on the floor. 7. Tighten your lower abdominal muscles to press your lower back against the floor. This motion will tilt your pelvis so your tailbone points up toward the ceiling instead of pointing to your feet or the floor. 8. With gentle tension and even breathing, hold this position for 5-10 seconds. Cat-Cow Repeat these steps until your lower back becomes more flexible: 6. Get into a hands-and-knees position on a firm surface. Keep your hands under your shoulders, and keep your knees under your hips. You may place padding under your knees for comfort. 7. Let your head hang down, and  point your tailbone toward the floor so your lower back becomes rounded like the back of a cat. 8. Hold this position for 5 seconds. 9. Slowly lift your head and point your tailbone up toward the ceiling so your back forms a sagging arch like the back of a cow. 10. Hold this position for 5 seconds. Press-Ups Repeat these steps 5-10 times: 6. Lie on your abdomen (face-down) on the floor. 7. Place your palms near your head, about shoulder-width apart. 8. While you keep your back as relaxed as possible and keep  your hips on the floor, slowly straighten your arms to raise the top half of your body and lift your shoulders. Do not use your back muscles to raise your upper torso. You may adjust the placement of your hands to make yourself more comfortable. 9. Hold this position for 5 seconds while you keep your back relaxed. 10. Slowly return to lying flat on the floor. Bridges Repeat these steps 10 times: 6. Lie on your back on a firm surface. 7. Bend your knees so they are pointing toward the ceiling and your feet are flat on the floor. 8. Tighten your buttocks muscles and lift your buttocks off of the floor until your waist is at almost the same height as your knees. You should feel the muscles working in your buttocks and the back of your thighs. If you do not feel these muscles, slide your feet 1-2 inches farther away from your buttocks. 9. Hold this position for 3-5 seconds. 10. Slowly lower your hips to the starting position, and allow your buttocks muscles to relax completely. If this exercise is too easy, try doing it with your arms crossed over your chest. Abdominal Crunches Repeat these steps 5-10 times: 7. Lie on your back on a firm bed or the floor with your legs extended. 8. Bend your knees so they are pointing toward the ceiling and your feet are flat on the floor. 9. Cross your arms over your chest. 10. Tip your chin slightly toward your chest without bending your neck. 11. Tighten  your abdominal muscles and slowly raise your trunk (torso) high enough to lift your shoulder blades a tiny bit off of the floor. Avoid raising your torso higher than that, because it can put too much stress on your low back and it does not help to strengthen your abdominal muscles. 12. Slowly return to your starting position. Back Lifts Repeat these steps 5-10 times: 6. Lie on your abdomen (face-down) with your arms at your sides, and rest your forehead on the floor. 7. Tighten the muscles in your legs and your buttocks. 8. Slowly lift your chest off of the floor while you keep your hips pressed to the floor. Keep the back of your head in line with the curve in your back. Your eyes should be looking at the floor. 9. Hold this position for 3-5 seconds. 10. Slowly return to your starting position. SEEK MEDICAL CARE IF:  Your back pain or discomfort gets much worse when you do an exercise.  Your back pain or discomfort does not lessen within 2 hours after you exercise. If you have any of these problems, stop doing these exercises right away. Do not do them again unless your health care provider says that you can. SEEK IMMEDIATE MEDICAL CARE IF:  You develop sudden, severe back pain. If this happens, stop doing the exercises right away. Do not do them again unless your health care provider says that you can.   This information is not intended to replace advice given to you by your health care provider. Make sure you discuss any questions you have with your health care provider.   Document Released: 08/01/2004 Document Revised: 03/15/2015 Document Reviewed: 08/18/2014 Elsevier Interactive Patient Education [employer]

## 2015-08-13 NOTE — ED Notes (Signed)
New address:  [address] [place], [place]  [address]

## 2015-08-13 NOTE — ED Provider Notes (Signed)
Medical Screening Exam  Pt placed in Fast Track/One Touch area for evaluation of back pain.   Using Phone interpreter (Arabic) it became apparent pt actually has two complaints.  He does have lower back and bilateral knee pain that began yesterday after lifting and carrying a heavy bed.  Denies neurologic deficits.   He also c/o 1 week of dyspnea on exertion, generalized fatigue.  States he has to rest for about 1 hour after any physical exertion.   Reports this is SOB, denies CP.   Orders placed including CBC, BMP, BNP, Troponin, EKG, CXR, lumbar xray.  Pt is stable to be moved to the main ED.     Trixie Dredge, PA-C 08/13/15 1427  Marily Memos, MD 08/16/15 [phone]

## 2015-08-13 NOTE — ED Provider Notes (Signed)
CSN: [id number]     Arrival date & time 08/13/15  1213 History   First MD Initiated Contact with Patient 08/13/15 1321     Chief Complaint  Patient presents with  . Shortness of Breath  . Back Pain     (Consider location/radiation/quality/duration/timing/severity/associated sxs/prior Treatment) HPI   Patient is a very pleasant 65 year old male presenting today with bilateral knee pain and back pain that began after lifting something heavy. Patient seen by PA and labs and x-rays ordered. I repeated physical exam. Denied chest pain.  Past Medical History  Diagnosis Date  . Hypertension   . Chronic back pain   . Sciatica    History reviewed. No pertinent past surgical history. Family History  Problem Relation Age of Onset  . Hyperlipidemia Father   . Cancer Brother   . Diabetes Paternal Uncle    Social History  Substance Use Topics  . Smoking status: Never Smoker   . Smokeless tobacco: None  . Alcohol Use: No    Review of Systems  Respiratory: Positive for shortness of breath.   Cardiovascular: Negative for chest pain.  Gastrointestinal: Negative for abdominal pain.  Musculoskeletal: Positive for myalgias and back pain.      Allergies  Review of patient's allergies indicates no known allergies.  Home Medications   Prior to Admission medications   Medication Sig Start Date End Date Taking? Authorizing Provider  amLODipine (NORVASC) 5 MG tablet Take 5 mg by mouth daily.   Yes Historical Provider, MD  diazepam (VALIUM) 5 MG tablet Take 1 tablet (5 mg total) by mouth 2 (two) times daily. 05/09/15  Yes Melene Plan, DO  albuterol (PROVENTIL HFA;VENTOLIN HFA) 108 (90 BASE) MCG/ACT inhaler Inhale into the lungs every 6 (six) hours as needed for wheezing or shortness of breath.    Historical Provider, MD   BP 122/77 mmHg  Pulse 72  Temp(Src) 98 F (36.7 C) (Oral)  Resp 16  SpO2 98% Physical Exam  Constitutional: He is oriented to person, place, and time. He appears  well-nourished.  HENT:  Head: Normocephalic.  Mouth/Throat: Oropharynx is clear and moist.  Eyes: Conjunctivae are normal.  Neck: No tracheal deviation present.  Cardiovascular: Normal rate.   Pulmonary/Chest: Effort normal. No stridor. No respiratory distress.  Abdominal: Soft. There is no tenderness. There is no guarding.  Musculoskeletal: Normal range of motion. He exhibits no edema.  Full range of motion bilateral knees. Pain with movement of back. No point tenderness.  Neurological: He is oriented to person, place, and time. No cranial nerve deficit.  Skin: Skin is warm and dry. No rash noted. He is not diaphoretic.  Psychiatric: He has a normal mood and affect.  Nursing note and vitals reviewed.   ED Course  Procedures (including critical care time) Labs Review Labs Reviewed  BASIC METABOLIC PANEL - Abnormal; Notable for the following:    Glucose, Bld 135 (*)    All other components within normal limits  CBC WITH DIFFERENTIAL/PLATELET - Abnormal; Notable for the following:    Platelets 120 (*)    All other components within normal limits  BRAIN NATRIURETIC PEPTIDE  TROPONIN I    Imaging Review Dg Chest 2 View  08/13/2015  CLINICAL DATA:  Patient with shortness of breath. Dyspnea on exertion for 1 day. EXAM: CHEST  2 VIEW COMPARISON:  Chest radiograph 09/30/2014. FINDINGS: Stable cardiac and mediastinal contours. No consolidative pulmonary opacities. No pleural effusion or pneumothorax. Regional skeleton is unremarkable. Nodular opacity projecting over the right  upper hemi thorax. IMPRESSION: Nodular opacity projecting over the right upper hemi thorax. While this may be secondary to overlapping tissue, true pulmonary nodule is not excluded. Recommend correlation with noncontrast enhanced chest CT. No acute cardiopulmonary process. Electronically Signed   By: Annia Belt M.D.   On: 08/13/2015 15:36   Dg Lumbar Spine Complete  08/13/2015  CLINICAL DATA:  Back pain EXAM: LUMBAR  SPINE - COMPLETE 4+ VIEW COMPARISON:  Lumbar MRI 05/09/2015 FINDINGS: Normal alignment. Negative for fracture. Moderate disc degeneration L5-S1. Remaining disc spaces have normal height. No pars defect. IMPRESSION: Lumbar disc degeneration L5-S1.  No acute bony abnormality. Electronically Signed   By: Marlan Palau M.D.   On: 08/13/2015 15:36   I have personally reviewed and evaluated these images and lab results as part of my medical decision-making.   EKG Interpretation   Date/Time:  Sunday August 13 2015 14:01:52 EST Ventricular Rate:  110 PR Interval:  128 QRS Duration: 78 QT Interval:  332 QTC Calculation: 449 R Axis:   50 Text Interpretation:  Sinus tachycardia Right atrial enlargement  Borderline ECG no acute ischemia No significant change since last tracing  Confirmed by Kandis Mannan ([id number]) on 08/13/2015 4:28:03 PM      MDM   Final diagnoses:  None   Patient is a pleasant 65 year old Arabic male presenting today with knee pain and back pain after lifting something heavy. Patient appears in no acute distress on exam. Patient has normal vital signs throughout his hospital stay. Patient EKG nonischemic. All labs are normal. Patient's x-rays are normal.   Patient likely with musculoskeletal pain after lifting some heavy. we'll discharge home and have patient follow up with primary care provider.   Calley Drenning Randall An, MD 08/13/15 [phone]

## 2015-08-13 NOTE — ED Notes (Addendum)
He c/o lower back pain radiating into bilateral legs onset after lifting a heavy bed yesterday. He tried ibuprofen with no relief of the pain. hes had back pain before but this is more severe, state he cant walk because the pain is too bad

## 2015-08-13 NOTE — ED Notes (Signed)
No answer in waiting area x 1 

## 2015-08-24 ENCOUNTER — Encounter: Payer: Self-pay | Admitting: Internal Medicine

## 2015-08-24 ENCOUNTER — Ambulatory Visit: Payer: Medicare Other | Attending: Internal Medicine | Admitting: Internal Medicine

## 2015-08-24 VITALS — BP 160/93 | HR 101 | Temp 98.5°F | Resp 16 | Ht 70.0 in | Wt 177.0 lb

## 2015-08-24 DIAGNOSIS — I10 Essential (primary) hypertension: Secondary | ICD-10-CM | POA: Diagnosis not present

## 2015-08-24 DIAGNOSIS — G8929 Other chronic pain: Secondary | ICD-10-CM | POA: Insufficient documentation

## 2015-08-24 DIAGNOSIS — M549 Dorsalgia, unspecified: Secondary | ICD-10-CM | POA: Insufficient documentation

## 2015-08-24 DIAGNOSIS — M5416 Radiculopathy, lumbar region: Secondary | ICD-10-CM | POA: Diagnosis not present

## 2015-08-24 DIAGNOSIS — Z79899 Other long term (current) drug therapy: Secondary | ICD-10-CM | POA: Insufficient documentation

## 2015-08-24 MED ORDER — TRAMADOL HCL 50 MG PO TABS: 50.0000 mg | ORAL_TABLET | Freq: Three times a day (TID) | ORAL | Status: DC | PRN

## 2015-08-24 MED ORDER — AMLODIPINE BESYLATE 5 MG PO TABS: 5.0000 mg | ORAL_TABLET | Freq: Every day | ORAL | Status: DC

## 2015-08-24 MED FILL — traMADol HCL 50 MG TABS: 50 | 20 days supply | Qty: 60 | Fill #0

## 2015-08-24 NOTE — Progress Notes (Signed)
Patient ID: Tyrone Mcdonald, male   DOB: August 23, 1950, 65 y.o.   MRN: [id number]  CC: back pain  HPI: Tyrone Mcdonald is a 65 y.o. male here today for a follow up visit.  Patient has past medical history of HTN and chronic back pain. Patient states that for the past 2 months he has been having symptoms of severe lower lumbar spine pain. Pain described as a a sharp shooting pain that radiates down his left buttock down to hamstring. Patient reports that he does not qualify for insurance and has been unable to see a Investment banker, operational. Review of EMR reveals that patient had a MRI 4 months ago which revealed several location of disc protrusions and degenerative disc disease. Pain is aggravated by sitting, standing, and walking. Patient has tried Ibuprofen for pain without relief. He denies bowel or bladder dysfunction.  No Known Allergies Past Medical History  Diagnosis Date  . Hypertension   . Chronic back pain   . Sciatica    Current Outpatient Prescriptions on File Prior to Visit  Medication Sig Dispense Refill  . amLODipine (NORVASC) 5 MG tablet Take 5 mg by mouth daily.    . cyclobenzaprine (FLEXERIL) 10 MG tablet Take 1 tablet (10 mg total) by mouth 2 (two) times daily as needed for muscle spasms. 10 tablet 0  . ibuprofen (ADVIL,MOTRIN) 800 MG tablet Take 1 tablet (800 mg total) by mouth 3 (three) times daily. 15 tablet 0  . albuterol (PROVENTIL HFA;VENTOLIN HFA) 108 (90 BASE) MCG/ACT inhaler Inhale into the lungs every 6 (six) hours as needed for wheezing or shortness of breath.    . diazepam (VALIUM) 5 MG tablet Take 1 tablet (5 mg total) by mouth 2 (two) times daily. 5 tablet 0   No current facility-administered medications on file prior to visit.   Family History  Problem Relation Age of Onset  . Hyperlipidemia Father   . Cancer Brother   . Diabetes Paternal Uncle    Social History   Social History  . Marital Status: Married    Spouse Name: N/A  . Number of Children: N/A  . Years of  Education: N/A   Occupational History  . Not on file.   Social History Main Topics  . Smoking status: Never Smoker   . Smokeless tobacco: Not on file  . Alcohol Use: No  . Drug Use: No  . Sexual Activity: Not on file   Other Topics Concern  . Not on file   Social History Narrative   ** Merged History Encounter **        Review of Systems: Other than what is stated in HPI, all other systems are negative.   Objective:   Filed Vitals:   08/24/15 1039  BP: 160/93  Pulse: 101  Temp: 98.5 F (36.9 C)  Resp: 16    Physical Exam  Constitutional: He is oriented to person, place, and time.  Cardiovascular: Normal rate, regular rhythm and normal heart sounds.   Pulmonary/Chest: Effort normal and breath sounds normal.  Musculoskeletal: Normal range of motion. He exhibits tenderness.  Neurological: He is alert and oriented to person, place, and time.  Skin: Skin is warm and dry.  Psychiatric: He has a normal mood and affect.     Lab Results  Component Value Date   WBC 4.2 08/13/2015   HGB 15.2 08/13/2015   HCT 45.8 08/13/2015   MCV 81.5 08/13/2015   PLT 120* 08/13/2015   Lab Results  Component Value Date  CREATININE 1.21 08/13/2015   BUN 18 08/13/2015   NA 141 08/13/2015   K 3.7 08/13/2015   CL 107 08/13/2015   CO2 25 08/13/2015    Lab Results  Component Value Date   HGBA1C 5.8* 07/25/2014   Lipid Panel     Component Value Date/Time   CHOL 172 07/25/2014 0957   TRIG 151* 07/25/2014 0957   HDL 48 07/25/2014 0957   CHOLHDL 3.6 07/25/2014 0957   VLDL 30 07/25/2014 0957   LDLCALC 94 07/25/2014 0957       Assessment and plan:   [name] was seen today for back pain.  Diagnoses and all orders for this visit:  Lumbar radiculopathy -     traMADol (ULTRAM) 50 MG tablet; Take 1 tablet (50 mg total) by mouth every 8 (eight) hours as needed for severe pain. I will place referral to Ortho once he applies for [employer] discount. May continue heat and back  exercises   Essential hypertension -     Refilled amLODipine (NORVASC) 5 MG tablet; Take 1 tablet (5 mg total) by mouth daily. BP likely elevated due to pain. All previous BP readings have been WNL.   Return in about 3 months (around 11/21/2015) for Hypertension.       Tyrone Finland, NP-C [facility] [phone] 08/24/2015, 11:06 AM

## 2015-08-24 NOTE — Progress Notes (Signed)
Patient here for follow up on his back pain Having pain across his lower back Currently taking ibuprofen for the pain

## 2015-09-01 ENCOUNTER — Ambulatory Visit: Payer: Medicare Other | Attending: Internal Medicine

## 2015-09-06 ENCOUNTER — Telehealth: Payer: Self-pay | Admitting: Internal Medicine

## 2015-09-06 NOTE — Telephone Encounter (Signed)
Patient dropped off handicapped place card paperwork to be fill out by the doctor Please follow up

## 2015-11-15 DIAGNOSIS — R413 Other amnesia: Secondary | ICD-10-CM

## 2015-11-15 DIAGNOSIS — G44209 Tension-type headache, unspecified, not intractable: Secondary | ICD-10-CM

## 2015-11-21 NOTE — Congregational Nurse Program (Signed)
Congregational Nurse Program Note  Date of Encounter: 11/15/2015  Past Medical History: Past Medical History  Diagnosis Date  . Hypertension   . Chronic back pain   . Sciatica     Encounter Details:     CNP Questionnaire - 11/15/15 1404    Patient Demographics   Is this a new or existing patient? New   Patient is considered a/an Refugee   Race African   Patient Assistance   Location of Patient Assistance Not Applicable   Patient's financial/insurance status Low Income;[place] [facility]/Care Connects   Uninsured Patient Yes   Interventions Referred to ED/Urgent Care   Patient referred to apply for the following financial assistance Not Applicable   Food insecurities addressed Not Applicable   Transportation assistance No   Assistance securing medications No   Educational health offerings Navigating the healthcare system   Encounter Details   Primary purpose of visit Acute Illness/Condition Visit;Education/Health Concerns   Was an Emergency Department visit averted? No   Does patient have a medical provider? No   Patient referred to Follow up with established PCP   Was a mental health screening completed? (GAINS tool) No   Does patient have dental issues? No   Does patient have vision issues? No   Does your patient have an abnormal blood pressure today? No   Since previous encounter, have you referred patient for abnormal blood pressure that resulted in a new diagnosis or medication change? No   Does your patient have an abnormal blood glucose today? No   Since previous encounter, have you referred patient for abnormal blood glucose that resulted in a new diagnosis or medication change? No   Was there a life-saving intervention made? No     Initial visit for this very pleasant  Arabic and Moderate English speaking man accompanied by spouse with complaint of recent dizziness, headache and memory changes, noted while driving. Currently on medications for hypertension. "I am  experiencing nervousness and confusion. States "I feel depressed".  Currently taking medication for hypertension. B/P 137/85. Pulse 105 and ? irregular. Denies chest or arm pain. Refer to [facility] for evaluation of symptoms.  Ferol Luz, RN/CN. [phone]

## 2016-01-02 ENCOUNTER — Ambulatory Visit: Payer: Medicare Other | Attending: Internal Medicine | Admitting: Internal Medicine

## 2016-01-02 VITALS — BP 166/92 | HR 83 | Wt 181.0 lb

## 2016-01-02 DIAGNOSIS — M545 Low back pain, unspecified: Secondary | ICD-10-CM

## 2016-01-02 DIAGNOSIS — N400 Enlarged prostate without lower urinary tract symptoms: Secondary | ICD-10-CM

## 2016-01-02 DIAGNOSIS — I10 Essential (primary) hypertension: Secondary | ICD-10-CM | POA: Diagnosis not present

## 2016-01-02 DIAGNOSIS — Z131 Encounter for screening for diabetes mellitus: Secondary | ICD-10-CM

## 2016-01-02 DIAGNOSIS — R350 Frequency of micturition: Secondary | ICD-10-CM | POA: Diagnosis not present

## 2016-01-02 DIAGNOSIS — Z125 Encounter for screening for malignant neoplasm of prostate: Secondary | ICD-10-CM

## 2016-01-02 DIAGNOSIS — G8929 Other chronic pain: Secondary | ICD-10-CM

## 2016-01-02 DIAGNOSIS — M5416 Radiculopathy, lumbar region: Secondary | ICD-10-CM | POA: Diagnosis not present

## 2016-01-02 LAB — POCT URINALYSIS DIP (MANUAL ENTRY)
BILIRUBIN UA: NEGATIVE
Bilirubin, UA: NEGATIVE
Glucose, UA: NEGATIVE
Leukocytes, UA: NEGATIVE
Nitrite, UA: NEGATIVE
PH UA: 5
PROTEIN UA: NEGATIVE
SPEC GRAV UA: 1.015
Urobilinogen, UA: 0.2

## 2016-01-02 LAB — POCT GLYCOSYLATED HEMOGLOBIN (HGB A1C): HEMOGLOBIN A1C: 5.6

## 2016-01-02 MED ORDER — AMLODIPINE BESYLATE 10 MG PO TABS: 5.0000 mg | ORAL_TABLET | Freq: Every day | ORAL | Status: DC

## 2016-01-02 MED ORDER — TAMSULOSIN HCL 0.4 MG PO CAPS: 0.4000 mg | ORAL_CAPSULE | Freq: Every day | ORAL | Status: DC

## 2016-01-02 MED FILL — ?TAMSULOSIN HCL 0.4 MG CAP: 0.4 | 30 days supply | Qty: 30 | Fill #0

## 2016-01-02 MED FILL — ?AMLODIPINE BESYLATE 5 MG T: 5 | 30 days supply | Qty: 30 | Fill #0

## 2016-01-02 NOTE — Progress Notes (Signed)
Tyrone Mcdonald, is a 65 y.o. male  ACZ:[id number]  FUX:[id number]  DOB - 05/02/1951  CC: back pain  HPI: Tyrone Mcdonald is a 65 y.o. male here today to establish medical care., last seen in clinic 08/24/15, w. signif pmhx of htn and chronic back pain, presents to clinic today w/ c/o of back pain.  Describes pain as severe, middle of back, radiating down both legs (L >R), and up his neck.  He states he is still working, but the ultram 50 and flexeril 10 were too strong for him, so he is only taking the motrin 800mg  tabs, but that does not seem to be enough.  He denies urinary  Or stool incontinence; but does c/o of urinary frequency and does not feel that he completely empties his bladder. No prior hx of BPH as far as he knows.  He c/o of memory deficits/forgetfulness as well these past 91months or so, he did not do well on his Citizenship testing due to this.  He brought in paperwk for me to fill out to assist w/ citizenship disability, he states he took it to the [facility] office but they stated bring it to your pcp.  +co of difficulties remembering shortterm things and worse concentration ability.  Patient has No headache, No chest pain, No abdominal pain - No Nausea, No new weakness tingling or numbness, No Cough - SOB.  Interpreter was used to communicate directly with patient for the entire encounter including providing detailed patient instructions.   Review of Systems: Per HPI, o/w all systems reviewed and negative.   No Known Allergies Past Medical History  Diagnosis Date  . Hypertension   . Chronic back pain   . Sciatica    Current Outpatient Prescriptions on File Prior to Visit  Medication Sig Dispense Refill  . albuterol (PROVENTIL HFA;VENTOLIN HFA) 108 (90 BASE) MCG/ACT inhaler Inhale into the lungs every 6 (six) hours as needed for wheezing or shortness of breath.    . cyclobenzaprine (FLEXERIL) 10 MG tablet Take 1 tablet (10 mg total) by mouth 2 (two) times daily  as needed for muscle spasms. 10 tablet 0  . ibuprofen (ADVIL,MOTRIN) 800 MG tablet Take 1 tablet (800 mg total) by mouth 3 (three) times daily. 15 tablet 0  . traMADol (ULTRAM) 50 MG tablet Take 1 tablet (50 mg total) by mouth every 8 (eight) hours as needed for severe pain. 60 tablet 1   No current facility-administered medications on file prior to visit.   Family History  Problem Relation Age of Onset  . Hyperlipidemia Father   . Cancer Brother   . Diabetes Paternal Uncle    Social History   Social History  . Marital Status: Married    Spouse Name: N/A  . Number of Children: N/A  . Years of Education: N/A   Occupational History  . Not on file.   Social History Main Topics  . Smoking status: Never Smoker   . Smokeless tobacco: Not on file  . Alcohol Use: No  . Drug Use: No  . Sexual Activity: Not on file   Other Topics Concern  . Not on file   Social History Narrative   ** Merged History Encounter **        Objective:   Filed Vitals:   01/02/16 1027  BP: 166/92  Pulse: 83    Filed Weights   01/02/16 1027  Weight: 181 lb (82.101 kg)    BP Readings from Last 3 Encounters:  01/02/16 166/92  11/15/15 137/85  08/24/15 160/93    Physical Exam: Constitutional: Patient appears well-developed and well-nourished. No distress. AAOx3, walking w/ cane and favoring his right side.  Slow mobility. HENT: Normocephalic, atraumatic, External right and left ear normal. Oropharynx is clear and moist.  Eyes: Conjunctivae and EOM are normal. PERRL, no scleral icterus. Neck: Normal ROM. Neck supple. No JVD. CVS: RRR, S1/S2 +, no murmurs, no gallops, no carotid bruit.  Pulmonary: Effort and breath sounds normal, no stridor, rhonchi, wheezes, rales.  Abdominal: Soft. BS +, no distension, tenderness, rebound or guarding.  Musculoskeletal: Normal range of motion.  ttp mid back. +tripod side on passive leg raises in sitting up position bilat.  + straight leg test bilateral when  lying prone, L > R pain at earlier degree of passive flexion, no parathesias.  LE: bilat/ no c/c/e, pulses 2+ bilateral. Neuro: Alert. 1+ knee reflexes bilat., muscle tone coordination wnl. No cranial nerve deficit grossly. Skin: Skin is warm and dry. No rash noted. Not diaphoretic. No erythema. No pallor. Psychiatric: Normal mood and affect. Behavior, judgment, thought content normal.  Lab Results  Component Value Date   WBC 4.2 08/13/2015   HGB 15.2 08/13/2015   HCT 45.8 08/13/2015   MCV 81.5 08/13/2015   PLT 120* 08/13/2015   Lab Results  Component Value Date   CREATININE 1.21 08/13/2015   BUN 18 08/13/2015   NA 141 08/13/2015   K 3.7 08/13/2015   CL 107 08/13/2015   CO2 25 08/13/2015    Lab Results  Component Value Date   HGBA1C 5.8* 07/25/2014   Lipid Panel     Component Value Date/Time   CHOL 172 07/25/2014 0957   TRIG 151* 07/25/2014 0957   HDL 48 07/25/2014 0957   CHOLHDL 3.6 07/25/2014 0957   VLDL 30 07/25/2014 0957   LDLCALC 94 07/25/2014 0957       Depression screen PHQ 2/9 08/24/2015  Decreased Interest 0  Down, Depressed, Hopeless 0  PHQ - 2 Score 0   Mri 11/16 IMPRESSION: 1. Broad-based central disc protrusion at L5-S1, closely approximating the transiting S1 nerve roots bilaterally in the lateral recesses. 2. Degenerative disc bulge with superimposed shallow right foraminal disc protrusion at L4-5, abutting the exiting right L4 nerve root as it courses out of the right neural foramen. 3. Degenerative disc bulge at L3-4 with resultant left greater than right mild foraminal narrowing. Bulging disc closely approximates the exiting left L3 nerve root in the left neural foramen without frank neural impingement or displacement. 4. Probable small left foraminal disc protrusion at T12-L1 without significant stenosis or neural impingement. 5. Chronic height loss at the superior endplate of L1 without bony retropulsion. No acute fracture within the  lumbar spine.  2/17 lumbar spine IMPRESSION: Lumbar disc degeneration L5-S1. No acute bony abnormality.    Assessment and plan:   1. Essential hypertension Uncontrolled, suspect worse w/ pain.  - increase norvasc 5 to 10mg  daily - amLODipine (NORVASC) 10 MG tablet; Take 0.5 tablets (5 mg total) by mouth daily.  Dispense: 90 tablet; Refill: 3  2. Lumbar radiculopathy L >R LE, w/ disc protrusion l5-s1 noted on mri, degen disc bulge l4-5 and l3-4 - significant discomfort on exam, not acute signs of cadua equina syndrome - Ambulatory referral to Orthopedic Surgery - recd pt take 1/2 tabs of ultram and flexeril given full doses too strong for him and motrin 800mg  not enough. - back stretch exercises provided  3. Urinary frequency, suspect  due to BPH - POCT urinalysis dipstick  4. Suspect bph - trial flomax 0.4 mg qd  5. Prostate cancer screening - PSA, Medicare  6. Diabetes mellitus screening - POCT glycosylated hemoglobin (Hb A1C)   Return in about 8 weeks (around 02/27/2016).  The patient was given clear instructions to go to ER or return to medical center if symptoms don't improve, worsen or new problems develop. The patient verbalized understanding. The patient was told to call to get lab results if they haven't heard anything in the next week.    This note has been created with Education officer, environmental. Any transcriptional errors are unintentional.   Pete Glatter, MD, MBA/MHA [facility] [place], [place] [phone]   01/02/2016, 11:20 AM

## 2016-01-02 NOTE — Patient Instructions (Signed)
Back Exercises °If you have pain in your back, do these exercises 2-3 times each day or as told by your doctor. When the pain goes away, do the exercises once each day, but repeat the steps more times for each exercise (do more repetitions). If you do not have pain in your back, do these exercises once each day or as told by your doctor. °EXERCISES °Single Knee to Chest °Do these steps 3-5 times in a row for each leg: °· Lie on your back on a firm bed or the floor with your legs stretched out. °· Bring one knee to your chest. °· Hold your knee to your chest by grabbing your knee or thigh. °· Pull on your knee until you feel a gentle stretch in your lower back. °· Keep doing the stretch for 10-30 seconds. °· Slowly let go of your leg and straighten it. °Pelvic Tilt °Do these steps 5-10 times in a row: °· Lie on your back on a firm bed or the floor with your legs stretched out. °· Bend your knees so they point up to the ceiling. Your feet should be flat on the floor. °· Tighten your lower belly (abdomen) muscles to press your lower back against the floor. This will make your tailbone point up to the ceiling instead of pointing down to your feet or the floor. °· Stay in this position for 5-10 seconds while you gently tighten your muscles and breathe evenly. °Cat-Cow °Do these steps until your lower back bends more easily: °· Get on your hands and knees on a firm surface. Keep your hands under your shoulders, and keep your knees under your hips. You may put padding under your knees. °· Let your head hang down, and make your tailbone point down to the floor so your lower back is round like the back of a cat. °· Stay in this position for 5 seconds. °· Slowly lift your head and make your tailbone point up to the ceiling so your back hangs low (sags) like the back of a cow. °· Stay in this position for 5 seconds. °Press-Ups °Do these steps 5-10 times in a row: °1. Lie on your belly (face-down) on the floor. °2. Place your  hands near your head, about shoulder-width apart. °3. While you keep your back relaxed and keep your hips on the floor, slowly straighten your arms to raise the top half of your body and lift your shoulders. Do not use your back muscles. To make yourself more comfortable, you may change where you place your hands. °4. Stay in this position for 5 seconds. °5. Slowly return to lying flat on the floor. °Bridges °Do these steps 10 times in a row: °1. Lie on your back on a firm surface. °2. Bend your knees so they point up to the ceiling. Your feet should be flat on the floor. °3. Tighten your butt muscles and lift your butt off of the floor until your waist is almost as high as your knees. If you do not feel the muscles working in your butt and the back of your thighs, slide your feet 1-2 inches farther away from your butt. °4. Stay in this position for 3-5 seconds. °5. Slowly lower your butt to the floor, and let your butt muscles relax. °If this exercise is too easy, try doing it with your arms crossed over your chest. °Belly Crunches °Do these steps 5-10 times in a row: °1. Lie on your back on a firm bed   or the floor with your legs stretched out. °2. Bend your knees so they point up to the ceiling. Your feet should be flat on the floor. °3. Cross your arms over your chest. °4. Tip your chin a little bit toward your chest but do not bend your neck. °5. Tighten your belly muscles and slowly raise your chest just enough to lift your shoulder blades a tiny bit off of the floor. °6. Slowly lower your chest and your head to the floor. °Back Lifts °Do these steps 5-10 times in a row: °1. Lie on your belly (face-down) with your arms at your sides, and rest your forehead on the floor. °2. Tighten the muscles in your legs and your butt. °3. Slowly lift your chest off of the floor while you keep your hips on the floor. Keep the back of your head in line with the curve in your back. Look at the floor while you do this. °4. Stay  in this position for 3-5 seconds. °5. Slowly lower your chest and your face to the floor. °GET HELP IF: °· Your back pain gets a lot worse when you do an exercise. °· Your back pain does not lessen 2 hours after you exercise. °If you have any of these problems, stop doing the exercises. Do not do them again unless your doctor says it is okay. °GET HELP RIGHT AWAY IF: °· You have sudden, very bad back pain. If this happens, stop doing the exercises. Do not do them again unless your doctor says it is okay. °  °This information is not intended to replace advice given to you by your health care provider. Make sure you discuss any questions you have with your health care provider. °  °Document Released: 07/27/2010 Document Revised: 03/15/2015 Document Reviewed: 08/18/2014 °Elsevier Interactive Patient Education ©2016 [employer]vier Inc[employer] ° °Back Pain, Adult °Back pain is very common. The pain often gets better over time. The cause of back pain is usually not dangerous. Most people can learn to manage their back pain on their own.  °HOME CARE  °Watch your back pain for any changes. The following actions may help to lessen any pain you are feeling: °· Stay active. Start with short walks on flat ground if you can. Try to walk farther each day. °· Exercise regularly as told by your doctor. Exercise helps your back heal faster. It also helps avoid future injury by keeping your muscles strong and flexible. °· Do not sit, drive, or stand in one place for more than 30 minutes. °· Do not stay in bed. Resting more than 1-2 days can slow down your recovery. °· Be careful when you bend or lift an object. Use good form when lifting: °¨ Bend at your knees. °¨ Keep the object close to your body. °¨ Do not twist. °· Sleep on a firm mattress. Lie on your side, and bend your knees. If you lie on your back, put a pillow under your knees. °· Take medicines only as told by your doctor. °· Put ice on the injured area. °¨ Put ice in a plastic  bag. °¨ Place a towel between your skin and the bag. °¨ Leave the ice on for 20 minutes, 2-3 times a day for the first 2-3 days. After that, you can switch between ice and heat packs. °· Avoid feeling anxious or stressed. Find good ways to deal with stress, such as exercise. °· Maintain a healthy weight. Extra weight puts stress on your back. °GET   HELP IF:  °· You have pain that does not go away with rest or medicine. °· You have worsening pain that goes down into your legs or buttocks. °· You have pain that does not get better in one week. °· You have pain at night. °· You lose weight. °· You have a fever or chills. °GET HELP RIGHT AWAY IF:  °· You cannot control when you poop (bowel movement) or pee (urinate). °· Your arms or legs feel weak. °· Your arms or legs lose feeling (numbness). °· You feel sick to your stomach (nauseous) or throw up (vomit). °· You have belly (abdominal) pain. °· You feel like you may pass out (faint). °  °This information is not intended to replace advice given to you by your health care provider. Make sure you discuss any questions you have with your health care provider. °  °Document Released: 12/11/2007 Document Revised: 07/15/2014 Document Reviewed: 10/26/2013 °Elsevier Interactive Patient Education ©2016 Elsevier Inc. ° °

## 2016-01-03 ENCOUNTER — Other Ambulatory Visit: Payer: Self-pay | Admitting: Internal Medicine

## 2016-01-03 DIAGNOSIS — R972 Elevated prostate specific antigen [PSA]: Secondary | ICD-10-CM

## 2016-01-03 LAB — PSA, MEDICARE: PSA: 4.39 ng/mL — ABNORMAL HIGH (ref <=4.00)

## 2016-01-18 ENCOUNTER — Telehealth: Payer: Self-pay | Admitting: Internal Medicine

## 2016-01-18 NOTE — Telephone Encounter (Signed)
Pt. Came into facility requesting to know the status of the paperwork that he dropped off about his citizenship. Please f/u

## 2016-01-26 ENCOUNTER — Ambulatory Visit: Payer: Medicare Other | Attending: Internal Medicine

## 2016-01-26 NOTE — Telephone Encounter (Signed)
Pt came into office requesting status of paperwork for citizenship,please f/up

## 2016-02-07 ENCOUNTER — Telehealth: Payer: Self-pay

## 2016-02-07 NOTE — Telephone Encounter (Signed)
-----   Message from Pete Glatter, MD sent at 01/03/2016  8:56 AM EDT ----- Please call pt.  He does not have diabetes, but his PSA is elevated, which may mean prostate cancer or just abnormality in lab. This is not a definitve test for Prostate ca.  Sent referral to Urology for further eval.  thanks

## 2016-02-07 NOTE — Telephone Encounter (Signed)
Clld pt  Used [employer] ID# [id number]  Interpreter adsvd pt's wife to have him to call our office re his lab results.

## 2016-02-08 NOTE — Progress Notes (Signed)
Two messages left for pt to return call for lab results. Will mail lab results.

## 2016-02-19 NOTE — Telephone Encounter (Signed)
Could you make appointment

## 2016-02-19 NOTE — Telephone Encounter (Signed)
Please have pt make appt w/ me. There is some further testing I have to do.  thank

## 2016-02-22 ENCOUNTER — Telehealth: Payer: Self-pay

## 2016-02-22 NOTE — Telephone Encounter (Signed)
Mailed patient lab results letter to him.   Letter was returned with yellow label:  Return to sender                                                               Insufficient address                                                               Unable to Forward   Tried calling pt - received voicemail once again on both numbers.

## 2016-02-28 ENCOUNTER — Encounter: Payer: Self-pay | Admitting: Internal Medicine

## 2016-02-28 ENCOUNTER — Ambulatory Visit: Payer: Self-pay | Attending: Internal Medicine | Admitting: Internal Medicine

## 2016-02-28 VITALS — BP 151/90 | HR 94 | Temp 98.2°F | Resp 16 | Wt 178.8 lb

## 2016-02-28 DIAGNOSIS — I10 Essential (primary) hypertension: Secondary | ICD-10-CM

## 2016-02-28 DIAGNOSIS — M4726 Other spondylosis with radiculopathy, lumbar region: Secondary | ICD-10-CM

## 2016-02-28 DIAGNOSIS — R413 Other amnesia: Secondary | ICD-10-CM

## 2016-02-28 DIAGNOSIS — R972 Elevated prostate specific antigen [PSA]: Secondary | ICD-10-CM

## 2016-02-28 DIAGNOSIS — Z789 Other specified health status: Secondary | ICD-10-CM

## 2016-02-28 MED ORDER — AMLODIPINE BESYLATE 10 MG PO TABS: 10.0000 mg | ORAL_TABLET | Freq: Every day | ORAL | 3 refills | Status: DC

## 2016-02-28 NOTE — Progress Notes (Signed)
Pt is in the office today for his citizenship forms to be filled out Pt states his pain level today in the office is a 10 Pt states his pain is coming from all over his body Pt states he needs all his medications refilled

## 2016-02-28 NOTE — Progress Notes (Signed)
Tyrone Mcdonald, is a 65 y.o. male  BPZ:[id number]  EUM:[id number]  DOB - 1950-10-23  Chief Complaint  Patient presents with  . Other    paperwork filled out        Subjective:   Tyrone Mcdonald is a 65 y.o. male Arabic, here today for a follow up visit for citizenship ppwk and htn.  Per pt, ran out of his bp meds for months, has not taken anything.  Of note, he states he tried to pick up he norvasc 5mg  from our pharmacy last time, but unable to pick up.  He has not seen ortho or Uro, he does not know any thing about those appts.  CMA attempted Mini-Mental exam, pt unable to complete any questions. He states he has very bad memory and does not understand any of the questions.   Continues to c/o of severe back pain w/ radiation down both legs.  Per pt, he takes care of his ill wife, they have no other family members to assist with interpretation at home when he gets phone calls, etc.  Patient has No headache, No chest pain, No abdominal pain - No Nausea, No new weakness tingling or numbness, No Cough - SOB. No urinary or stool incontinence.  Interpreter was used to communicate directly with patient for the entire encounter including providing detailed patient instructions.   No problems updated.  ALLERGIES: No Known Allergies  PAST MEDICAL HISTORY: Past Medical History:  Diagnosis Date  . Chronic back pain   . Hypertension   . Sciatica     MEDICATIONS AT HOME: Prior to Admission medications   Medication Sig Start Date End Date Taking? Authorizing Provider  albuterol (PROVENTIL HFA;VENTOLIN HFA) 108 (90 BASE) MCG/ACT inhaler Inhale into the lungs every 6 (six) hours as needed for wheezing or shortness of breath.   Yes Historical Provider, MD  amLODipine (NORVASC) 10 MG tablet Take 1 tablet (10 mg total) by mouth daily. 02/28/16  Yes 03/01/16, MD  tamsulosin (FLOMAX) 0.4 MG CAPS capsule Take 1 capsule (0.4 mg total) by mouth daily. 01/02/16  Yes 01/04/16, MD  cyclobenzaprine (FLEXERIL) 10 MG tablet Take 1 tablet (10 mg total) by mouth 2 (two) times daily as needed for muscle spasms. Patient not taking: Reported on 02/28/2016 08/13/15   Courteney Lyn Mackuen, MD  ibuprofen (ADVIL,MOTRIN) 800 MG tablet Take 1 tablet (800 mg total) by mouth 3 (three) times daily. Patient not taking: Reported on 02/28/2016 08/13/15   Courteney Lyn Mackuen, MD  traMADol (ULTRAM) 50 MG tablet Take 1 tablet (50 mg total) by mouth every 8 (eight) hours as needed for severe pain. Patient not taking: Reported on 02/28/2016 08/24/15   08/26/15, NP     Objective:   Vitals:   02/28/16 1009  BP: (!) 151/90  Pulse: 94  Resp: 16  Temp: 98.2 F (36.8 C)  TempSrc: Oral  SpO2: 97%  Weight: 178 lb 12.8 oz (81.1 kg)    Exam General appearance : Awake, alert, not in any distress. Speech Clear. Not toxic looking, pleasant. HEENT: Atraumatic and Normocephalic, pupils equally reactive to light. Neck: supple, no JVD.  Chest:Good air entry bilaterally, no added sounds. CVS: S1 S2 regular, no murmurs/gallups or rubs. Abdomen: Bowel sounds active, obese, Non tender and not distended with no gaurding, rigidity or rebound. Extremities: B/L Lower Ext shows no edema, both legs are warm to touch Neurology: Awake alert, and oriented X 3, CN II-XII grossly intact,  Non focal Speech fluent, but noted memory deficits.  Per patient unable to complete mini-mental exam. Skin:No Rash  Data Review Lab Results  Component Value Date   HGBA1C 5.6 01/02/2016   HGBA1C 5.8 (H) 07/25/2014    Depression screen PHQ 2/9 08/24/2015  Decreased Interest 0  Down, Depressed, Hopeless 0  PHQ - 2 Score 0      Assessment & Plan   1. Essential hypertension - Uncontrolled, dw low salt diet w/ pt with interpreter assistance - amLODipine (NORVASC) 10 MG tablet; Take 1 tablet (10 mg total) by mouth daily.  Dispense: 90 tablet; Refill: 3  2. Memory deficit and cognitive deficits. -  would not even attempt mini-mental, I am not certain if this is true cognitive decline vs 2nd gain due to need for Citizenship paperwk done in setting of "disability." - Ambulatory referral to Neurology for detailed exam.  3. Abnormal PSA - recommend follow-up with urology referral still pending at this time - We'll send referral specialist to help assist with this as well  4. Degenerative joint disease of the lumbar spine with radiculopathy.  - Per patient, he never heard for orthopedics. We will send note to referral specialist to also check on this as well.  5. Language barrier makes it difficult to take care of patient even with the assistance of a interpreter via the phone.  Patient have been counseled extensively about nutrition and exercise  Return in about 2 weeks (around 03/13/2016) for bp chk.  The patient was given clear instructions to go to ER or return to medical center if symptoms don't improve, worsen or new problems develop. The patient verbalized understanding. The patient was told to call to get lab results if they haven't heard anything in the next week.   This note has been created with Education officer, environmental. Any transcriptional errors are unintentional.   Pete Glatter, MD, MBA/MHA [facility] and [facility] [place], [place] [phone]   02/28/2016, 11:08 AM

## 2016-02-28 NOTE — Patient Instructions (Addendum)
[employer] card and cone discount renewal - please assist - financial appt.  Pharmacy to pick up rx/ blood pressure meds today.   Low salt diet.

## 2016-03-06 ENCOUNTER — Ambulatory Visit: Payer: Self-pay | Attending: Internal Medicine

## 2016-03-19 ENCOUNTER — Ambulatory Visit: Payer: Self-pay | Attending: Internal Medicine | Admitting: Pharmacist

## 2016-03-19 ENCOUNTER — Encounter: Payer: Self-pay | Admitting: Pharmacist

## 2016-03-19 VITALS — BP 135/86 | HR 92

## 2016-03-19 DIAGNOSIS — M5416 Radiculopathy, lumbar region: Secondary | ICD-10-CM

## 2016-03-19 DIAGNOSIS — F32A Depression, unspecified: Secondary | ICD-10-CM

## 2016-03-19 DIAGNOSIS — G4700 Insomnia, unspecified: Secondary | ICD-10-CM

## 2016-03-19 DIAGNOSIS — F329 Major depressive disorder, single episode, unspecified: Secondary | ICD-10-CM

## 2016-03-19 MED ORDER — CYCLOBENZAPRINE HCL 10 MG PO TABS: 10.0000 mg | ORAL_TABLET | Freq: Three times a day (TID) | ORAL | 1 refills | Status: DC | PRN

## 2016-03-19 MED ORDER — TRAMADOL HCL 50 MG PO TABS: 50.0000 mg | ORAL_TABLET | Freq: Three times a day (TID) | ORAL | 1 refills | Status: DC | PRN

## 2016-03-19 MED ORDER — CYCLOBENZAPRINE HCL 10 MG PO TABS: 10.0000 mg | ORAL_TABLET | Freq: Two times a day (BID) | ORAL | 0 refills | Status: DC | PRN

## 2016-03-19 MED ORDER — AMITRIPTYLINE HCL 25 MG PO TABS: 25.0000 mg | ORAL_TABLET | Freq: Every day | ORAL | 0 refills | Status: DC

## 2016-03-19 NOTE — Progress Notes (Signed)
Patient seen for blood pressure check today. Patient currently on amlodipine 10 mg daily and denies any missed doses.   BP Readings from Last 3 Encounters:  03/19/16 135/86  02/28/16 (!) 151/90  01/02/16 (!) 166/92    Blood pressure is normal but patient complaining of insomnia and depression. Continue amlodipine 10 mg daily. Will consult Dr. Julien Nordmann for further management.

## 2016-03-19 NOTE — Progress Notes (Signed)
Tyrone Mcdonald, is a 65 y.o. male  MOL:[id number]  [id number]:[id number]  DOB - [birth year]  No chief complaint on file.       Subjective:   Tyrone Mcdonald is a 65 y.o. male here today for a follow up visit. Asked to see pt today during pt's bp chk w/ pharmacist.  Pt c/o of significant back pain, frustration due to back pain and inability to get citizenship.  He states his pain is so severe at times that has hard time working (which he is still doing to help support his family in [place]).  He is depressed due to his medical condition and circumstances.  Of note, he wants to die all the time, but his religion prevents him from suicide.  He does not have an plans for Suicide or intent. Denies avh/hi.   Has difificult time sleeping at night.  Missed ortho appt last time b/c did not understand [place]. Of note, he is currently applying for renewal of his [employer] card.  Interpreter was used to communicate directly with patient for the entire encounter including providing detailed patient instructions.   No problems updated.  ALLERGIES: No Known Allergies  PAST MEDICAL HISTORY: Past Medical History:  Diagnosis Date  . Chronic back pain   . Hypertension   . Sciatica     MEDICATIONS AT HOME: Prior to Admission medications   Medication Sig Start Date End Date Taking? Authorizing Provider  albuterol (PROVENTIL HFA;VENTOLIN HFA) 108 (90 BASE) MCG/ACT inhaler Inhale into the lungs every 6 (six) hours as needed for wheezing or shortness of breath.    Historical Provider, MD  amitriptyline (ELAVIL) 25 MG tablet Take 1 tablet (25 mg total) by mouth at bedtime. 03/19/16   Pete Glatter, MD  amLODipine (NORVASC) 10 MG tablet Take 1 tablet (10 mg total) by mouth daily. 02/28/16   Pete Glatter, MD  cyclobenzaprine (FLEXERIL) 10 MG tablet Take 1 tablet (10 mg total) by mouth 3 (three) times daily as needed for muscle spasms. 03/19/16   Pete Glatter, MD  ibuprofen (ADVIL,MOTRIN) 800  MG tablet Take 1 tablet (800 mg total) by mouth 3 (three) times daily. Patient not taking: Reported on 02/28/2016 08/13/15   Courteney Lyn Mackuen, MD  tamsulosin (FLOMAX) 0.4 MG CAPS capsule Take 1 capsule (0.4 mg total) by mouth daily. 01/02/16   Pete Glatter, MD  traMADol (ULTRAM) 50 MG tablet Take 1 tablet (50 mg total) by mouth every 8 (eight) hours as needed for severe pain. 03/19/16   Pete Glatter, MD     Objective:   Vitals:   03/19/16 1009  BP: 135/86  Pulse: 92    Exam General appearance : Awake, alert, not in any distress. Speech Clear. Not toxic looking, depressed, poor eye contract HEENT: Atraumatic and Normocephalic, Chest:Good air entry bilaterally, no added sounds. CVS: S1 S2 regular, no murmurs/gallups or rubs. Neurology: Awake alert, and oriented X 3, CN II-XII grossly intact, Non focal Skin:No Rash  Data Review Lab Results  Component Value Date   HGBA1C 5.6 01/02/2016   HGBA1C 5.8 (H) 07/25/2014    Depression screen PHQ 2/9 08/24/2015  Decreased Interest 0  Down, Depressed, Hopeless 0  PHQ - 2 Score 0      Assessment & Plan   1. Lumbar radiculopathy - renewed flexeril - traMADol (ULTRAM) 50 MG tablet; Take 1 tablet (50 mg total) by mouth every 8 (eight) hours as needed for severe pain.  Dispense: 60 tablet; Refill:  1 - sent note to referral specialist to chk on status of ortho appt.  2. Depression (emotion), due to medical illness/stressors, no si/hi/avh currently amitriptyline 25 qhs, will help w/ both insomnia and anxiety/depression  3. Insomnia See above  Case discussed w/ pharmacist.     Patient have been counseled extensively about nutrition and exercise  Return in about 2 weeks (around 04/02/2016) for pain /depression.  The patient was given clear instructions to go to ER or return to medical center if symptoms don't improve, worsen or new problems develop. The patient verbalized understanding. The patient was told to call to get  lab results if they haven't heard anything in the next week.   This note has been created with Education officer, environmental. Any transcriptional errors are unintentional.   Pete Glatter, MD, MBA/MHA [facility] and [facility] [place], [place] [phone]   03/19/2016, 10:45 AM

## 2016-04-16 ENCOUNTER — Ambulatory Visit: Payer: Self-pay | Admitting: Urology

## 2016-04-23 ENCOUNTER — Ambulatory Visit: Payer: Self-pay | Admitting: Neurology

## 2016-06-21 ENCOUNTER — Ambulatory Visit: Payer: Medicare Other | Attending: Internal Medicine

## 2016-06-21 MED FILL — AMLODIPINE BESYLATE 5 MG TA: 5 | 30 days supply | Qty: 30 | Fill #1

## 2016-07-02 ENCOUNTER — Ambulatory Visit: Payer: Self-pay | Admitting: Neurology

## 2016-07-12 ENCOUNTER — Encounter: Payer: Self-pay | Admitting: Neurology

## 2016-07-30 ENCOUNTER — Emergency Department (HOSPITAL_COMMUNITY): Payer: Medicare Other

## 2016-07-30 ENCOUNTER — Encounter (HOSPITAL_COMMUNITY): Payer: Self-pay | Admitting: Emergency Medicine

## 2016-07-30 ENCOUNTER — Emergency Department (HOSPITAL_COMMUNITY)
Admission: EM | Admit: 2016-07-30 | Discharge: 2016-07-30 | Disposition: A | Discharge status: Home / Self Care | Payer: Medicare Other | Attending: Emergency Medicine | Admitting: Emergency Medicine

## 2016-07-30 DIAGNOSIS — M79622 Pain in left upper arm: Secondary | ICD-10-CM

## 2016-07-30 DIAGNOSIS — I10 Essential (primary) hypertension: Secondary | ICD-10-CM | POA: Insufficient documentation

## 2016-07-30 DIAGNOSIS — M7989 Other specified soft tissue disorders: Secondary | ICD-10-CM | POA: Insufficient documentation

## 2016-07-30 DIAGNOSIS — Z79899 Other long term (current) drug therapy: Secondary | ICD-10-CM | POA: Insufficient documentation

## 2016-07-30 LAB — CBC WITH DIFFERENTIAL/PLATELET
BASOS PCT: 0 %
Basophils Absolute: 0 10*3/uL (ref 0.0–0.1)
EOS ABS: 0.3 10*3/uL (ref 0.0–0.7)
Eosinophils Relative: 7 %
HEMATOCRIT: 39 % (ref 39.0–52.0)
Hemoglobin: 12.9 g/dL — ABNORMAL LOW (ref 13.0–17.0)
Lymphocytes Relative: 37 %
Lymphs Abs: 1.8 10*3/uL (ref 0.7–4.0)
MCH: 26.4 pg (ref 26.0–34.0)
MCHC: 33.1 g/dL (ref 30.0–36.0)
MCV: 79.9 fL (ref 78.0–100.0)
MONO ABS: 0.4 10*3/uL (ref 0.1–1.0)
MONOS PCT: 8 %
Neutro Abs: 2.4 10*3/uL (ref 1.7–7.7)
Neutrophils Relative %: 48 %
Platelets: 163 10*3/uL (ref 150–400)
RBC: 4.88 MIL/uL (ref 4.22–5.81)
RDW: 13.6 % (ref 11.5–15.5)
WBC: 4.9 10*3/uL (ref 4.0–10.5)

## 2016-07-30 LAB — BASIC METABOLIC PANEL
Anion gap: 6 (ref 5–15)
BUN: 18 mg/dL (ref 6–20)
CALCIUM: 9 mg/dL (ref 8.9–10.3)
CO2: 24 mmol/L (ref 22–32)
CREATININE: 1.26 mg/dL — AB (ref 0.61–1.24)
Chloride: 109 mmol/L (ref 101–111)
GFR calc Af Amer: >60 mL/min (ref >60)
GFR calc non Af Amer: 57 mL/min — ABNORMAL LOW (ref >60)
GLUCOSE: 100 mg/dL — AB (ref 65–99)
Potassium: 3.7 mmol/L (ref 3.5–5.1)
Sodium: 139 mmol/L (ref 135–145)

## 2016-07-30 LAB — CK: Total CK: 74 U/L (ref 49–397)

## 2016-07-30 MED ORDER — NAPROXEN 500 MG PO TABS: 500.0000 mg | ORAL_TABLET | Freq: Two times a day (BID) | ORAL | 0 refills | Status: DC

## 2016-07-30 MED ORDER — METHOCARBAMOL 500 MG PO TABS: 500.0000 mg | ORAL_TABLET | Freq: Four times a day (QID) | ORAL | 0 refills | Status: DC

## 2016-07-30 NOTE — ED Provider Notes (Signed)
Plains of pain over left upper arm, posterior aspect overlying triceps for the past 2-3 days. Pain is worse with moving his arm improved with remaining still. He denies elbow pain. On exam he is alert and nontoxic. Appears mildly uncomfortable. Left upper extremity is in a sling. Upon removing the sling there is no deformity no redness no warmth he is exquisitely tender overlying the triceps. Elbow is nontender he has full range of motion. Radial pulse 2+   Doug Sou, MD 07/30/16 1541

## 2016-07-30 NOTE — ED Notes (Signed)
Left arm sling applied with positive radial pulse post application.

## 2016-07-30 NOTE — ED Notes (Signed)
Pt states he understands instructions Home stable with family with steady gait. 

## 2016-07-30 NOTE — ED Provider Notes (Signed)
[facility]-EMERGENCY DEPT Provider Note   CSN: [id number] Arrival date & time: 07/30/16  0973   By signing my name below, I, Clovis Pu, attest that this documentation has been prepared under the direction and in the presence of  [employer]. Electronically Signed: Clovis Pu, ED Scribe. 07/30/16. 9:35 AM.   History   Chief Complaint Chief Complaint  Patient presents with  . Elbow Pain   The history is provided by the patient. No language interpreter was used.   HPI Comments:  Tyrone Mcdonald is a 66 y.o. male, with a hx of gout, sciatica and HTN, who presents to the Emergency Department complaining of acute onset, moderate, burning left upper arm pain x 2-3 days. He notes associated swelling. His pain is worse to the touch. He has taken ibuprofen with no significant relief. Pt denies fevers, any injuries/falls, a hx of similar pain, hx of DM and any other associated symptoms at this time. He notes aspirin and amlodipine for daily medication use.    Past Medical History:  Diagnosis Date  . Chronic back pain   . Hypertension   . Sciatica     Patient Active Problem List   Diagnosis Date Noted  . Osteoarthritis of spine with radiculopathy, lumbar region 02/28/2016  . Language barrier affecting health care 02/28/2016  . Abnormal PSA 02/28/2016  . Essential hypertension 07/25/2014  . Family history of diabetes mellitus (DM) 07/25/2014  . Bilateral knee pain 07/25/2014  . Rhinitis, allergic 07/25/2014    History reviewed. No pertinent surgical history.     Home Medications    Prior to Admission medications   Medication Sig Start Date End Date Taking? Authorizing Provider  albuterol (PROVENTIL HFA;VENTOLIN HFA) 108 (90 BASE) MCG/ACT inhaler Inhale into the lungs every 6 (six) hours as needed for wheezing or shortness of breath.    Historical Provider, MD  amitriptyline (ELAVIL) 25 MG tablet Take 1 tablet (25 mg total) by mouth at bedtime. 03/19/16   Pete Glatter, MD  amLODipine (NORVASC) 10 MG tablet Take 1 tablet (10 mg total) by mouth daily. 02/28/16   Pete Glatter, MD  cyclobenzaprine (FLEXERIL) 10 MG tablet Take 1 tablet (10 mg total) by mouth 3 (three) times daily as needed for muscle spasms. 03/19/16   Pete Glatter, MD  ibuprofen (ADVIL,MOTRIN) 800 MG tablet Take 1 tablet (800 mg total) by mouth 3 (three) times daily. Patient not taking: Reported on 02/28/2016 08/13/15   Courteney Lyn Mackuen, MD  tamsulosin (FLOMAX) 0.4 MG CAPS capsule Take 1 capsule (0.4 mg total) by mouth daily. 01/02/16   Pete Glatter, MD  traMADol (ULTRAM) 50 MG tablet Take 1 tablet (50 mg total) by mouth every 8 (eight) hours as needed for severe pain. 03/19/16   Pete Glatter, MD    Family History Family History  Problem Relation Age of Onset  . Hyperlipidemia Father   . Cancer Brother   . Diabetes Paternal Uncle     Social History Social History  Substance Use Topics  . Smoking status: Never Smoker  . Smokeless tobacco: Never Used  . Alcohol use No     Allergies   Patient has no known allergies.   Review of Systems Review of Systems  Constitutional: Negative for fever.  HENT: Negative for rhinorrhea and sore throat.   Eyes: Negative for redness.  Respiratory: Negative for cough.   Cardiovascular: Negative for chest pain.  Gastrointestinal: Negative for abdominal pain, diarrhea, nausea and vomiting.  Genitourinary: Negative for dysuria.  Musculoskeletal: Positive for myalgias. Negative for arthralgias.  Skin: Negative for color change and rash.  Neurological: Positive for weakness. Negative for headaches.     Physical Exam Updated Vital Signs BP 145/86 (BP Location: Right Arm)   Pulse 98   Temp 98 F (36.7 C) (Oral)   Resp 18   SpO2 99%   Physical Exam  Constitutional: He is oriented to person, place, and time. He appears well-developed and well-nourished. No distress.  HENT:  Head: Normocephalic and atraumatic.  Eyes:  Conjunctivae are normal.  Cardiovascular: Normal rate.   Pulmonary/Chest: Effort normal.  Abdominal: He exhibits no distension.  Musculoskeletal:       Left shoulder: Normal.       Left elbow: Normal. He exhibits normal range of motion and no swelling. No tenderness found.       Cervical back: Normal.       Left upper arm: He exhibits tenderness, swelling and edema. He exhibits no bony tenderness.       Arms: Neurological: He is alert and oriented to person, place, and time.  Skin: Skin is warm and dry.  Psychiatric: He has a normal mood and affect.  Nursing note and vitals reviewed.    ED Treatments / Results  DIAGNOSTIC STUDIES:  Oxygen Saturation is 99% on RA, normal by my interpretation.    COORDINATION OF CARE:  9:34 AM Will ultrasound the pt's left upper extremity and order lab work. Discussed treatment plan with pt at bedside and pt agreed to plan.  Labs (all labs ordered are listed, but only abnormal results are displayed) Labs Reviewed  BASIC METABOLIC PANEL - Abnormal; Notable for the following:       Result Value   Glucose, Bld 100 (*)    Creatinine, Ser 1.26 (*)    GFR calc non Af Amer 57 (*)    All other components within normal limits  CBC WITH DIFFERENTIAL/PLATELET - Abnormal; Notable for the following:    Hemoglobin 12.9 (*)    All other components within normal limits  CK    Radiology Mr Humerus Left Wo Contrast  Result Date: 07/30/2016 CLINICAL DATA:  Acute onset moderate burning in the left upper arm for 2-3 days with swelling. No known injury. EXAM: MRI OF THE LEFT HUMERUS WITHOUT CONTRAST TECHNIQUE: Multiplanar, multisequence MR imaging of the left upper arm was performed. No intravenous contrast was administered. COMPARISON:  None. FINDINGS: Bones/Joint/Cartilage Moderate acromioclavicular degenerative change is seen with marrow edema about the joint. There also appears to be some degenerative change about the elbow which is incompletely imaged. Bone  marrow signal is otherwise normal. No fracture, stress change or worrisome lesion is identified. Ligaments Negative. Muscles and Tendons Intact.  Normal signal throughout.  No atrophy or focal lesion. Soft tissues Normal.  No fluid collection or mass. IMPRESSION: No acute abnormality or finding to explain the patient's symptoms. Moderate appearing acromioclavicular osteoarthritis. Mild appearing degenerative change about the left elbow is also identified. Electronically Signed   By: Drusilla Kanner M.D.   On: 07/30/2016 14:15    Procedures Procedures (including critical care time)  Medications Ordered in ED Medications - No data to display   Initial Impression / Assessment and Plan / ED Course  I have reviewed the triage vital signs and the nursing notes.  Pertinent labs & imaging results that were available during my care of the patient were reviewed by me and considered in my medical decision making (see chart  for details).     Patient seen and examined.  Vital signs reviewed and are as follows: BP 118/80 (BP Location: Right Arm)   Pulse 82   Temp 97.8 F (36.6 C) (Oral)   Resp 18   SpO2 96%   EMERGENCY DEPARTMENT US SOFT TISSUE INTERPRETATION "Study: Limited Ultrasound of the noted body part in comments below"  INDICATIONS: Pain Multiple views of the body part are obtained with a multi-frequency linear probe  PERFORMED BY:  Myself  IMAGES ARCHIVED?: Yes  SIDE:Left  BODY PART:Upper extremity  FINDINGS: No abcess noted and Cellulitis absent  LIMITATIONS:  none  INTERPRETATION:  No abcess noted and No cellulitis noted  COMMENT:    CBC, BMP, CK ordered -- unremarkable.   Discussed case with Dr. Ethelda Chick who has seen patient. Will image to ensure no developing infection given swelling and tenderness.   2:48 PM Pt updated on all results. Encouraged RICE protocol, heat. D/c with robaxin and naproxen.   Encouraged PCP f/u for recheck this week.     Final  Clinical Impressions(s) / ED Diagnoses   Final diagnoses:  Left arm swelling  Pain of left upper arm   Patient with exquisite left upper arm pain and swelling. MR is negative for necrotizing fasciitis or other serious etiology. Upper extremity is neurovascularly intact. Will treat conservatively and reassuring labs and imaging.  New Prescriptions Discharge Medication List as of 07/30/2016  2:38 PM    START taking these medications   Details  methocarbamol (ROBAXIN) 500 MG tablet Take 1 tablet (500 mg total) by mouth 4 (four) times daily., Starting Tue 07/30/2016, Print    naproxen (NAPROSYN) 500 MG tablet Take 1 tablet (500 mg total) by mouth 2 (two) times daily., Starting Tue 07/30/2016, Print      I personally performed the services described in this documentation, which was scribed in my presence. The recorded information has been reviewed and is accurate.     Renne Crigler, PA-C 07/30/16 1449    Doug Sou, MD 07/30/16 [phone]

## 2016-07-30 NOTE — ED Triage Notes (Signed)
Left elbow pain started 3  Days ago , feel like fire he states , will not let me touch it, feel warm to touch no redness some swelling

## 2016-07-30 NOTE — ED Notes (Signed)
Pt rweturns from MRI

## 2016-07-30 NOTE — Discharge Instructions (Signed)
Please read and follow all provided instructions.  Your diagnoses today include:  1. Pain of left upper arm   2. Left arm swelling     Tests performed today include:  Blood counts and electrolytes - normal  CK - normal  MRI - no serious problems or infection  Vital signs. See below for your results today.   Medications prescribed:   Robaxin (methocarbamol) - muscle relaxer medication  DO NOT drive or perform any activities that require you to be awake and alert because this medicine can make you drowsy.    Naproxen - anti-inflammatory pain medication  Do not exceed 500mg  naproxen every 12 hours, take with food  You have been prescribed an anti-inflammatory medication or NSAID. Take with food. Take smallest effective dose for the shortest duration needed for your pain. Stop taking if you experience stomach pain or vomiting.   Take any prescribed medications only as directed.  Home care instructions:   Follow any educational materials contained in this packet  Follow R.I.C.E. Protocol:  R - rest your injury   I  - use ice on injury without applying directly to skin  C - compress injury with bandage or splint  E - elevate the injury as much as possible  Follow-up instructions: Please follow-up with your primary care provider this week for recheck.   Return instructions:   Please return if your fingers are numb or tingling, appear gray or blue, or you have severe pain (also elevate the arm and loosen splint or wrap if you were given one)  Please return to the Emergency Department if you experience worsening symptoms.   Please return if you have any other emergent concerns.  Additional Information:  Your vital signs today were: BP 118/80 (BP Location: Right Arm)    Pulse 82    Temp 97.8 F (36.6 C) (Oral)    Resp 18    SpO2 96%  If your blood pressure (BP) was elevated above 135/85 this visit, please have this repeated by your doctor within one  month. --------------

## 2016-08-27 ENCOUNTER — Ambulatory Visit: Payer: Self-pay | Admitting: Neurology

## 2016-09-23 ENCOUNTER — Ambulatory Visit: Payer: Medicare Other | Attending: Internal Medicine

## 2016-11-21 ENCOUNTER — Encounter: Payer: Self-pay | Admitting: Internal Medicine

## 2016-11-22 ENCOUNTER — Encounter: Payer: Self-pay | Admitting: Internal Medicine

## 2016-11-25 ENCOUNTER — Encounter: Payer: Self-pay | Admitting: Internal Medicine

## 2017-01-03 ENCOUNTER — Ambulatory Visit: Payer: Medicare Other | Attending: Internal Medicine

## 2017-01-13 ENCOUNTER — Emergency Department (HOSPITAL_COMMUNITY)
Admission: EM | Admit: 2017-01-13 | Discharge: 2017-01-13 | Discharge status: Against Medical Advice | Payer: Medicare Other

## 2017-01-13 NOTE — ED Notes (Signed)
Patient came to desk saying he was leaving. I had spoken to him about 20 minutes ago saying he would be triaged ASAP and encouraged him to stay. At this time he doesn't want to stay and is leaving.

## 2017-03-28 ENCOUNTER — Encounter: Payer: Self-pay | Admitting: Internal Medicine

## 2017-03-28 ENCOUNTER — Ambulatory Visit: Payer: Self-pay | Attending: Internal Medicine | Admitting: Internal Medicine

## 2017-03-28 VITALS — BP 169/85 | HR 98 | Temp 98.8°F | Resp 18 | Ht 64.0 in | Wt 174.6 lb

## 2017-03-28 DIAGNOSIS — G8929 Other chronic pain: Secondary | ICD-10-CM

## 2017-03-28 DIAGNOSIS — I10 Essential (primary) hypertension: Secondary | ICD-10-CM

## 2017-03-28 DIAGNOSIS — M25561 Pain in right knee: Secondary | ICD-10-CM | POA: Insufficient documentation

## 2017-03-28 DIAGNOSIS — N401 Enlarged prostate with lower urinary tract symptoms: Secondary | ICD-10-CM

## 2017-03-28 DIAGNOSIS — M5416 Radiculopathy, lumbar region: Secondary | ICD-10-CM

## 2017-03-28 DIAGNOSIS — R350 Frequency of micturition: Secondary | ICD-10-CM

## 2017-03-28 DIAGNOSIS — M542 Cervicalgia: Secondary | ICD-10-CM

## 2017-03-28 DIAGNOSIS — M5127 Other intervertebral disc displacement, lumbosacral region: Secondary | ICD-10-CM | POA: Insufficient documentation

## 2017-03-28 DIAGNOSIS — M25562 Pain in left knee: Secondary | ICD-10-CM | POA: Insufficient documentation

## 2017-03-28 DIAGNOSIS — R351 Nocturia: Secondary | ICD-10-CM | POA: Insufficient documentation

## 2017-03-28 DIAGNOSIS — M5116 Intervertebral disc disorders with radiculopathy, lumbar region: Secondary | ICD-10-CM | POA: Insufficient documentation

## 2017-03-28 DIAGNOSIS — Z79891 Long term (current) use of opiate analgesic: Secondary | ICD-10-CM | POA: Insufficient documentation

## 2017-03-28 DIAGNOSIS — Z79899 Other long term (current) drug therapy: Secondary | ICD-10-CM | POA: Insufficient documentation

## 2017-03-28 MED ORDER — TRAMADOL HCL 50 MG PO TABS: 50.0000 mg | ORAL_TABLET | Freq: Three times a day (TID) | ORAL | 0 refills | Status: DC | PRN

## 2017-03-28 MED ORDER — NAPROXEN 500 MG PO TABS: 500.0000 mg | ORAL_TABLET | Freq: Two times a day (BID) | ORAL | 1 refills | Status: DC

## 2017-03-28 MED ORDER — TAMSULOSIN HCL 0.4 MG PO CAPS: 0.4000 mg | ORAL_CAPSULE | Freq: Every day | ORAL | 3 refills | Status: DC

## 2017-03-28 MED ORDER — AMLODIPINE BESYLATE 10 MG PO TABS: 10.0000 mg | ORAL_TABLET | Freq: Every day | ORAL | 3 refills | Status: DC

## 2017-03-28 NOTE — Progress Notes (Signed)
Patient ID: Tyrone Mcdonald, male    DOB: 27-Jul-1950  MRN: [id number]  CC:   Subjective:  Tyrone Mcdonald is a 66 y.o. male who presents for UC visit. His concerns today include:  Patient with history of HTN, BPH , lumbar radiculopathy   1. HTN: out Amlodipine. Requesting refill  2. Nocturia: out of Flomax. Made him drowsy during the day. Does not recall whether he was taking the medicine in the morning or at nights.  3. Chronic back pain in lower back that radiates to RT leg. -Previous PCP had tried to referred him to orthopedics several times which was problematic due to language barrier even though he had [facility] discount. He has left her with him today that basically states his [facility] discount will expire at the end of this month MRI of the lumbar spine done in 2016 revealed the following: IMPRESSION: 1. Broad-based central disc protrusion at L5-S1, closely approximating the transiting S1 nerve roots bilaterally in the lateral recesses. 2. Degenerative disc bulge with superimposed shallow right foraminal disc protrusion at L4-5, abutting the exiting right L4 nerve root as it courses out of the right neural foramen. 3. Degenerative disc bulge at L3-4 with resultant left greater than right mild foraminal narrowing. Bulging disc closely approximates the exiting left L3 nerve root in the left neural foramen without frank neural impingement or displacement. 4. Probable small left foraminal disc protrusion at T12-L1 without significant stenosis or neural impingement. 5. Chronic height loss at the superior endplate of L1 without bony retropulsion. No acute fracture within the lumbar spine.  Also pain in neck especially when he turns his head during driving -Some pain into the arms Patient Active Problem List   Diagnosis Date Noted  . Osteoarthritis of spine with radiculopathy, lumbar region 02/28/2016  . Language barrier affecting health care 02/28/2016  . Abnormal  PSA 02/28/2016  . Essential hypertension 07/25/2014  . Family history of diabetes mellitus (DM) 07/25/2014  . Bilateral knee pain 07/25/2014  . Rhinitis, allergic 07/25/2014     Current Outpatient Prescriptions on File Prior to Visit  Medication Sig Dispense Refill  . methocarbamol (ROBAXIN) 500 MG tablet Take 1 tablet (500 mg total) by mouth 4 (four) times daily. 20 tablet 0   No current facility-administered medications on file prior to visit.     No Known Allergies   ROS: Review of Systems As stated above  PHYSICAL EXAM: BP (!) 169/85 (BP Location: Left Arm, Patient Position: Sitting, Cuff Size: Normal)   Pulse 98   Temp 98.8 F (37.1 C) (Oral)   Resp 18   Ht 5\' 4"  (1.626 m)   Wt 174 lb 9.6 oz (79.2 kg)   SpO2 97%   BMI 29.97 kg/m   Physical Exam  General appearance - alert, well appearing, and in no distress Mental status - difficult to determine his level of understanding Neck -no cervical lymphadenopathy or abnormal masses Chest - clear to auscultation, no wheezes, rales or rhonchi, symmetric air entry, no tachypnea, retractions or cyanosis Heart - normal rate, regular rhythm, normal S1, S2, no murmurs, rubs, clicks or gallops Musculoskeletal - mild tenderness on palpation of the lumbar spine and cervical spine. Discomfort with passive rotation of the neck. Power in upper extremities 5 out of 5. Gait is normal Extremities - no lower extremity edema    Chemistry      Component Value Date/Time   NA 139 07/30/2016 1000   K 3.7 07/30/2016 1000  CL 109 07/30/2016 1000   CO2 24 07/30/2016 1000   BUN 18 07/30/2016 1000   CREATININE 1.26 (H) 07/30/2016 1000   CREATININE 1.33 07/25/2014 0957      Component Value Date/Time   CALCIUM 9.0 07/30/2016 1000   ALKPHOS 106 07/25/2014 0957   AST 12 07/25/2014 0957   ALT <8 07/25/2014 0957   BILITOT 0.3 07/25/2014 0957     Lab Results  Component Value Date   PSA 4.39 (H) 01/02/2016    ASSESSMENT AND PLAN: 1.  Essential hypertension Not at goal. Refill amlodipine - amLODipine (NORVASC) 10 MG tablet; Take 1 tablet (10 mg total) by mouth daily.  Dispense: 90 tablet; Refill: 3 - Comprehensive metabolic panel - Hemoglobin A1c  2. Benign prostatic hyperplasia with urinary frequency Patient advised to take the Flomax at night and to go slow with position changes if he has to wake up during the night - tamsulosin (FLOMAX) 0.4 MG CAPS capsule; Take 1 capsule (0.4 mg total) by mouth daily.  Dispense: 30 capsule; Refill: 3  3. Lumbar radiculopathy -I requested to see whether our financial counselor can meet with him today while he is here to help him renew the [facility] discount so that I can refer him to orthopedics - naproxen (NAPROSYN) 500 MG tablet; Take 1 tablet (500 mg total) by mouth 2 (two) times daily.  Dispense: 60 tablet; Refill: 1 - traMADol (ULTRAM) 50 MG tablet; Take 1 tablet (50 mg total) by mouth every 8 (eight) hours as needed for severe pain.  Dispense: 60 tablet; Refill: 0 - Ambulatory referral to Orthopedic Surgery  4. Chronic neck pain - DG Cervical Spine Complete; Future  Patient was given the opportunity to ask questions.  Patient verbalized understanding of the plan and was able to repeat key elements of the plan.   Orders Placed This Encounter  Procedures  . DG Cervical Spine Complete  . Comprehensive metabolic panel  . Hemoglobin A1c  . Ambulatory referral to Orthopedic Surgery     Requested Prescriptions   Signed Prescriptions Disp Refills  . amLODipine (NORVASC) 10 MG tablet 90 tablet 3    Sig: Take 1 tablet (10 mg total) by mouth daily.  . tamsulosin (FLOMAX) 0.4 MG CAPS capsule 30 capsule 3    Sig: Take 1 capsule (0.4 mg total) by mouth daily.  . naproxen (NAPROSYN) 500 MG tablet 60 tablet 1    Sig: Take 1 tablet (500 mg total) by mouth 2 (two) times daily.  . traMADol (ULTRAM) 50 MG tablet 60 tablet 0    Sig: Take 1 tablet (50 mg total) by mouth every 8 (eight) hours  as needed for severe pain.    Future Appointments Date Time Provider Department Center  05/23/2017 3:45 PM Marcine Matar, MD [facility] None    Jonah Blue, MD, [facility]

## 2017-03-28 NOTE — Progress Notes (Signed)
Patient is here for f/up   Patient stated that he been without med for 2 months now

## 2017-03-29 LAB — COMPREHENSIVE METABOLIC PANEL
ALK PHOS: 84 IU/L (ref 39–117)
ALT: 9 IU/L (ref 0–44)
AST: 22 IU/L (ref 0–40)
Albumin/Globulin Ratio: 1.5 (ref 1.2–2.2)
Albumin: 4.4 g/dL (ref 3.6–4.8)
BUN/Creatinine Ratio: 11 (ref 10–24)
BUN: 17 mg/dL (ref 8–27)
Bilirubin Total: 0.4 mg/dL (ref 0.0–1.2)
CALCIUM: 9.7 mg/dL (ref 8.6–10.2)
CO2: 18 mmol/L — AB (ref 20–29)
CREATININE: 1.51 mg/dL — AB (ref 0.76–1.27)
Chloride: 109 mmol/L — ABNORMAL HIGH (ref 96–106)
GFR calc Af Amer: 54 mL/min/{1.73_m2} — ABNORMAL LOW (ref >59)
GFR, EST NON AFRICAN AMERICAN: 47 mL/min/{1.73_m2} — AB (ref >59)
Globulin, Total: 2.9 g/dL (ref 1.5–4.5)
Glucose: 93 mg/dL (ref 65–99)
POTASSIUM: 3.8 mmol/L (ref 3.5–5.2)
Sodium: 145 mmol/L — ABNORMAL HIGH (ref 134–144)
TOTAL PROTEIN: 7.3 g/dL (ref 6.0–8.5)

## 2017-03-29 LAB — HEMOGLOBIN A1C
ESTIMATED AVERAGE GLUCOSE: 120 mg/dL
HEMOGLOBIN A1C: 5.8 % — AB (ref 4.8–5.6)

## 2017-04-24 ENCOUNTER — Ambulatory Visit (INDEPENDENT_AMBULATORY_CARE_PROVIDER_SITE_OTHER): Payer: Self-pay | Admitting: Orthopedic Surgery

## 2017-04-30 ENCOUNTER — Ambulatory Visit (INDEPENDENT_AMBULATORY_CARE_PROVIDER_SITE_OTHER): Payer: Self-pay | Admitting: Orthopedic Surgery

## 2017-04-30 ENCOUNTER — Encounter (INDEPENDENT_AMBULATORY_CARE_PROVIDER_SITE_OTHER): Payer: Self-pay | Admitting: Orthopedic Surgery

## 2017-04-30 ENCOUNTER — Ambulatory Visit (INDEPENDENT_AMBULATORY_CARE_PROVIDER_SITE_OTHER): Payer: Self-pay

## 2017-04-30 DIAGNOSIS — M1712 Unilateral primary osteoarthritis, left knee: Secondary | ICD-10-CM

## 2017-04-30 DIAGNOSIS — M542 Cervicalgia: Secondary | ICD-10-CM

## 2017-04-30 DIAGNOSIS — G8929 Other chronic pain: Secondary | ICD-10-CM

## 2017-04-30 DIAGNOSIS — M5441 Lumbago with sciatica, right side: Secondary | ICD-10-CM

## 2017-04-30 DIAGNOSIS — M1711 Unilateral primary osteoarthritis, right knee: Secondary | ICD-10-CM

## 2017-04-30 MED ORDER — LIDOCAINE HCL 1 % IJ SOLN
5.0000 mL | INTRAMUSCULAR | Status: AC | PRN
  Administered 2017-04-30: 5 mL

## 2017-04-30 MED ORDER — METHYLPREDNISOLONE ACETATE 40 MG/ML IJ SUSP
40.0000 mg | INTRAMUSCULAR | Status: AC | PRN
  Administered 2017-04-30: 40 mg via INTRA_ARTICULAR

## 2017-04-30 MED ORDER — BUPIVACAINE HCL 0.25 % IJ SOLN
4.0000 mL | INTRAMUSCULAR | Status: AC | PRN
  Administered 2017-04-30: 4 mL via INTRA_ARTICULAR

## 2017-04-30 NOTE — Progress Notes (Signed)
Office Visit Note   Patient: Tyrone Mcdonald           Date of Birth: [birth year]           MRN: [id number] Visit Date: 04/30/2017 Requested by: Marcine Matar, MD [address] [place], [place] [address] PCP: Marcine Matar, MD  Subjective: Chief Complaint  Patient presents with  . Neck - Pain  . Lower Back - Pain    HPI: Tsuneo is a patient with bilateral knee pain low back pain radiating into the right and left legs as well as neck pain radiating into both arms with numbness and tingling.  Clinic visit is performed today with the aid of a translator which increases the time and level of difficulty.  He has had pain for 2 years since coming to [place].  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  Pain will wake him from sleep at night.  He has taken some over-the-counter anti-inflammatories.  In regards to his legs he reports bilateral leg pain right worse than left with some numbness and tingling in the right leg.  States that the pain is constant and he does report radicular component to the pain.  2016 MRI lumbar spine reviewed.  It shows central disc at L5-S1, L4-5 right-sided foraminal H&P, and L3-4 left-sided neuroforaminal H&P.  He also has had you flex injections in both the right and left knee which did not help much.  Outside radiographs of the knees are reviewed and he has in the stage tricompartmental arthritis worse in the medial compartment with varus alignment  Patient also reports neck pain with radicular symptoms into the right and left arm.  He states that the numbness and tingling comes and goes into the dorsal and palmar aspect of both hands.  He denies any loss of dexterity.  He is intent on continuing working because he has to feed his family that he brought over.              ROS: All systems reviewed are negative as they relate to the chief complaint within the history of present illness.  Patient denies  fevers or chills.   Assessment &  Plan: Visit Diagnoses:  1. Neck pain   2. Chronic low back pain with right-sided sciatica, unspecified back pain laterality     Plan: Impression is low back pain with radiculopathy and foraminal encroachment at multiple levels.  His right leg seems to be worse.  Never had injections after MRI scan in 2016.  Plan is referral to Dr. Alvester Morin for evaluation of low back pain with likely epidural steroid injections to follow.  Patient also has significant bilateral knee arthritis.  Failed gel injection 1.  Plan cortisone injection and discussed the eventual need for total knee replacement depending on his symptoms.  In regard to his neck and upper extremity radicular problems I think C-spine MRI scan is indicated with likely epidural steroid injections to follow.  He does have significant degenerative disc disease in the lower cervical spine region  Follow-Up Instructions: No Follow-up on file.   Orders:  Orders Placed This Encounter  Procedures  . XR Cervical Spine 2 or 3 views  . MR Cervical Spine w/o contrast  . Ambulatory referral to Physical Medicine Rehab   No orders of the defined types were placed in this encounter.     Procedures: Large Joint Inj Date/Time: 04/30/2017 6:58 PM Performed by: Cammy Copa Authorized by: Rise Paganini  SCOTT   Consent Given by:  Patient Site marked: the procedure site was marked   Timeout: prior to procedure the correct patient, procedure, and site was verified   Indications:  Pain, joint swelling and diagnostic evaluation Location:  Knee Site:  R knee Prep: patient was prepped and draped in usual sterile fashion   Needle Size:  18 G Needle Length:  1.5 inches Approach:  Superolateral Ultrasound Guidance: No   Fluoroscopic Guidance: No   Arthrogram: No   Medications:  5 mL lidocaine 1 %; 4 mL bupivacaine 0.25 %; 40 mg methylPREDNISolone acetate 40 MG/ML Patient tolerance:  Patient tolerated the procedure well with no immediate  complications Large Joint Inj Date/Time: 04/30/2017 6:58 PM Performed by: Cammy Copa Authorized by: Cammy Copa   Consent Given by:  Patient Site marked: the procedure site was marked   Timeout: prior to procedure the correct patient, procedure, and site was verified   Indications:  Pain, joint swelling and diagnostic evaluation Location:  Knee Site:  L knee Prep: patient was prepped and draped in usual sterile fashion   Needle Size:  18 G Needle Length:  1.5 inches Approach:  Superolateral Ultrasound Guidance: No   Fluoroscopic Guidance: No   Arthrogram: No   Medications:  5 mL lidocaine 1 %; 4 mL bupivacaine 0.25 %; 40 mg methylPREDNISolone acetate 40 MG/ML Patient tolerance:  Patient tolerated the procedure well with no immediate complications     Clinical Data: No additional findings.  Objective: Vital Signs: There were no vitals taken for this visit.  Physical Exam:   Constitutional: PatieOrthopedic exam demonstrates slight varus alignment bilaterally are chemise.  Palpable pedal pulses bilaterally.  No nerve root tension signs and no groin pain with internal/external rotation of the leg.  He has 5 flexion contractures in both knees.  Flexion easily past 90.  Extensor mechanism intact.  Collateral and cruciate ligaments stable with no knee effusion.  Negative Babinski negative clonus with symmetric reflexes 2+ out of 4 bilateral patella and Achilles.  Does have mild paresthesias L5 distribution on the right compared to left but no saddle paresthesias.  No muscle atrophy in the lower extremities.  Examination of his upper extremities demonstrates reasonable cervical spine range of motion limited 15 rotation from maximal to the right and left.  2 out of 4 bilateral biceps and triceps reflexes with palpable radial pulses.  No other masses lymph adenopathy or skin changes noted in the neck region.  Shoulder examination normal.  Radial pulse intact.  Patient has  5 out of 5 grip EPL FPL interosseous wrist flexion and wrist extension biceps triceps and deltoid strength with negative Tinel's in the cubital tunnel and full range of motion of the wrist and elbows bilaterally.  No definite paresthesias C5-T1 see abovent appears well-developed HEENT:  Head: Normocephalic Eyes:EOM are normal Neck: Normal range of motion Cardiovascular: Normal rate Pulmonary/chest: Effort normal Neurologic: Patient is alert Skin: Skin is warm Psychiatric: Patient has normal mood and affect    Ortho Exam: See above  Specialty Comments:  No specialty comments available.  Imaging: Xr Cervical Spine 2 Or 3 Views  Result Date: 04/30/2017 AP lateral cervical spine reviewed.  There is loss of lordosis and some mild kyphosis of the cervical spine.  Significant degenerative disc disease with anterior spurring is present at C5-6 and C6-7.  Facet arthritis also noted at these levels.  There is poor delineation of the spinous processes at these levels as well.  PMFS History: Patient Active Problem List   Diagnosis Date Noted  . Benign prostatic hyperplasia with urinary frequency 03/28/2017  . Lumbar radiculopathy 03/28/2017  . Chronic neck pain 03/28/2017  . Osteoarthritis of spine with radiculopathy, lumbar region 02/28/2016  . Language barrier affecting health care 02/28/2016  . Abnormal PSA 02/28/2016  . Essential hypertension 07/25/2014  . Family history of diabetes mellitus (DM) 07/25/2014  . Bilateral knee pain 07/25/2014  . Rhinitis, allergic 07/25/2014   Past Medical History:  Diagnosis Date  . Chronic back pain   . Hypertension   . Sciatica     Family History  Problem Relation Age of Onset  . Hyperlipidemia Father   . Cancer Brother   . Diabetes Paternal Uncle     No past surgical history on file. Social History   Occupational History  . Not on file.   Social History Main Topics  . Smoking status: Never Smoker  . Smokeless tobacco: Never  Used  . Alcohol use No  . Drug use: No  . Sexual activity: Not on file

## 2017-05-02 ENCOUNTER — Ambulatory Visit: Payer: Self-pay | Attending: Internal Medicine | Admitting: *Deleted

## 2017-05-02 DIAGNOSIS — Z23 Encounter for immunization: Secondary | ICD-10-CM | POA: Insufficient documentation

## 2017-05-14 ENCOUNTER — Ambulatory Visit (HOSPITAL_COMMUNITY): Payer: Self-pay

## 2017-05-23 ENCOUNTER — Encounter: Payer: Self-pay | Admitting: Internal Medicine

## 2017-05-23 ENCOUNTER — Ambulatory Visit: Payer: Self-pay | Attending: Internal Medicine | Admitting: Internal Medicine

## 2017-05-23 ENCOUNTER — Other Ambulatory Visit: Payer: Self-pay

## 2017-05-23 VITALS — BP 134/83 | HR 96 | Temp 98.4°F | Resp 16 | Wt 178.0 lb

## 2017-05-23 DIAGNOSIS — R350 Frequency of micturition: Secondary | ICD-10-CM | POA: Insufficient documentation

## 2017-05-23 DIAGNOSIS — N183 Chronic kidney disease, stage 3 unspecified: Secondary | ICD-10-CM | POA: Insufficient documentation

## 2017-05-23 DIAGNOSIS — M4726 Other spondylosis with radiculopathy, lumbar region: Secondary | ICD-10-CM | POA: Insufficient documentation

## 2017-05-23 DIAGNOSIS — Z1331 Encounter for screening for depression: Secondary | ICD-10-CM | POA: Insufficient documentation

## 2017-05-23 DIAGNOSIS — M5416 Radiculopathy, lumbar region: Secondary | ICD-10-CM

## 2017-05-23 DIAGNOSIS — I10 Essential (primary) hypertension: Secondary | ICD-10-CM

## 2017-05-23 DIAGNOSIS — I129 Hypertensive chronic kidney disease with stage 1 through stage 4 chronic kidney disease, or unspecified chronic kidney disease: Secondary | ICD-10-CM | POA: Insufficient documentation

## 2017-05-23 DIAGNOSIS — N401 Enlarged prostate with lower urinary tract symptoms: Secondary | ICD-10-CM | POA: Insufficient documentation

## 2017-05-23 DIAGNOSIS — G8929 Other chronic pain: Secondary | ICD-10-CM | POA: Insufficient documentation

## 2017-05-23 DIAGNOSIS — M542 Cervicalgia: Secondary | ICD-10-CM | POA: Insufficient documentation

## 2017-05-23 DIAGNOSIS — M170 Bilateral primary osteoarthritis of knee: Secondary | ICD-10-CM | POA: Insufficient documentation

## 2017-05-23 DIAGNOSIS — Z833 Family history of diabetes mellitus: Secondary | ICD-10-CM | POA: Insufficient documentation

## 2017-05-23 MED ORDER — TRAMADOL HCL 50 MG PO TABS: 50.0000 mg | ORAL_TABLET | Freq: Three times a day (TID) | ORAL | 1 refills | Status: DC | PRN

## 2017-05-23 MED ORDER — METHOCARBAMOL 500 MG PO TABS: 500.0000 mg | ORAL_TABLET | Freq: Two times a day (BID) | ORAL | 1 refills | Status: DC | PRN

## 2017-05-23 MED ORDER — TAMSULOSIN HCL 0.4 MG PO CAPS: 0.4000 mg | ORAL_CAPSULE | Freq: Every day | ORAL | 6 refills | Status: DC

## 2017-05-23 MED ORDER — AMLODIPINE BESYLATE 10 MG PO TABS: 10.0000 mg | ORAL_TABLET | Freq: Every day | ORAL | 11 refills | Status: DC

## 2017-05-23 NOTE — Progress Notes (Signed)
Patient ID: Tyrone Mcdonald, male    DOB: [birth year]  MRN: [id number]  CC: No chief complaint on file.   Subjective: Tyrone Mcdonald is a 66 y.o. male who presents for chronic ds management. Last seen 03/2017 His concerns today include:  Patient with history of HTN, BPH , lumbar radiculopathy, OA BL knees Patient does not have medications with him.  States that he is out of all medicines even though these were refilled on last visit.  He would like for me to write out in Arabic what each medicine is for  1. Cervical and Lumbar radiculopathy: -saw ortho. Referred to Dr. Ernestina Patches for inj to lumbar spine but he did not go. Afraid to do injections to back. Would like to try med and physical therapy.  Given Tramadol on last visit. He did not bring meds with him today and does not recall which one it is or whether it help  2. OA: given steroid inj BL by ortho. Helped for 3-4 days then pain returned.  No falls.  3. HTN: out of Norvasc. Had to take wife's BP med. He does not understand the RF process -eGFR reduced from 57 to 47 on blood test done in September.  Plan was to have him stop Naprosyn. .  Patient Active Problem List   Diagnosis Date Noted  . Benign prostatic hyperplasia with urinary frequency 03/28/2017  . Lumbar radiculopathy 03/28/2017  . Chronic neck pain 03/28/2017  . Osteoarthritis of spine with radiculopathy, lumbar region 02/28/2016  . Language barrier affecting health care 02/28/2016  . Abnormal PSA 02/28/2016  . Essential hypertension 07/25/2014  . Family history of diabetes mellitus (DM) 07/25/2014  . Bilateral knee pain 07/25/2014  . Rhinitis, allergic 07/25/2014     Current Outpatient Medications on File Prior to Visit  Medication Sig Dispense Refill  . amLODipine (NORVASC) 10 MG tablet Take 1 tablet (10 mg total) by mouth daily. (Patient not taking: Reported on 05/23/2017) 90 tablet 3  . methocarbamol (ROBAXIN) 500 MG tablet Take 1 tablet (500 mg total) by  mouth 4 (four) times daily. (Patient not taking: Reported on 05/23/2017) 20 tablet 0  . naproxen (NAPROSYN) 500 MG tablet Take 1 tablet (500 mg total) by mouth 2 (two) times daily. (Patient not taking: Reported on 05/23/2017) 60 tablet 1  . tamsulosin (FLOMAX) 0.4 MG CAPS capsule Take 1 capsule (0.4 mg total) by mouth daily. (Patient not taking: Reported on 05/23/2017) 30 capsule 3  . traMADol (ULTRAM) 50 MG tablet Take 1 tablet (50 mg total) by mouth every 8 (eight) hours as needed for severe pain. (Patient not taking: Reported on 05/23/2017) 60 tablet 0   No current facility-administered medications on file prior to visit.     No Known Allergies  Social History   Socioeconomic History  . Marital status: Married    Spouse name: Not on file  . Number of children: Not on file  . Years of education: Not on file  . Highest education level: Not on file  Social Needs  . Financial resource strain: Not on file  . Food insecurity - worry: Not on file  . Food insecurity - inability: Not on file  . Transportation needs - medical: Not on file  . Transportation needs - non-medical: Not on file  Occupational History  . Not on file  Tobacco Use  . Smoking status: Never Smoker  . Smokeless tobacco: Never Used  Substance and Sexual Activity  . Alcohol use: No  Alcohol/week: 0.0 oz  . Drug use: No  . Sexual activity: Not on file  Other Topics Concern  . Not on file  Social History Narrative   ** Merged History Encounter **        Family History  Problem Relation Age of Onset  . Hyperlipidemia Father   . Cancer Brother   . Diabetes Paternal Uncle     No past surgical history on file.  ROS: Review of Systems Negative except as stated above PHYSICAL EXAM: BP 134/83   Pulse 96   Temp 98.4 F (36.9 C) (Oral)   Resp 16   Wt 178 lb (80.7 kg)   BMI 30.55 kg/m   Physical Exam  General appearance - alert, well appearing, and in no distress Mental status - alert, oriented to  person, place, and time, normal mood, behavior, speech, dress, motor activity, and thought processes Chest - clear to auscultation, no wheezes, rales or rhonchi, symmetric air entry Heart - normal rate, regular rhythm, normal S1, S2, no murmurs, rubs, clicks or gallops Musculoskeletal -gait is slow.  He has to take his time with transfers due to knee pain. Extremities -no lower extremity edema  Depression screen [facility] 2/9 05/23/2017 03/28/2017 08/24/2015  Decreased Interest 3 3 0  Down, Depressed, Hopeless 3 3 0  PHQ - 2 Score 6 6 0  Altered sleeping 3 3 -  Tired, decreased energy 2 3 -  Change in appetite 2 3 -  Feeling bad or failure about yourself  3 3 -  Trouble concentrating - 3 -  Moving slowly or fidgety/restless - 3 -  Suicidal thoughts - 0 -  PHQ-9 Score 16 24 -     Chemistry      Component Value Date/Time   NA 145 (H) 03/28/2017 1631   K 3.8 03/28/2017 1631   CL 109 (H) 03/28/2017 1631   CO2 18 (L) 03/28/2017 1631   BUN 17 03/28/2017 1631   CREATININE 1.51 (H) 03/28/2017 1631   CREATININE 1.33 07/25/2014 0957      Component Value Date/Time   CALCIUM 9.7 03/28/2017 1631   ALKPHOS 84 03/28/2017 1631   AST 22 03/28/2017 1631   ALT 9 03/28/2017 1631   BILITOT 0.4 03/28/2017 1631      ASSESSMENT AND PLAN: 1. Lumbar radiculopathy We will give her a refill on tramadol.  Patient informed that medication can cause drowsiness. Stop Naprosyn given kidney function. Physical therapy referral - traMADol (ULTRAM) 50 MG tablet; Take 1 tablet (50 mg total) every 8 (eight) hours as needed by mouth for moderate pain or severe pain.  Dispense: 90 tablet; Refill: 1 - Ambulatory referral to Physical Therapy  2. Essential hypertension - amLODipine (NORVASC) 10 MG tablet; Take 1 tablet (10 mg total) daily by mouth.  Dispense: 30 tablet; Refill: 11  3. Benign prostatic hyperplasia with urinary frequency - tamsulosin (FLOMAX) 0.4 MG CAPS capsule; Take 1 capsule (0.4 mg total) daily by  mouth.  Dispense: 30 capsule; Refill: 6  4. Osteoarthritis of both knees, unspecified osteoarthritis type See #1 above  5. CKD (chronic kidney disease) stage 3, GFR 30-59 ml/min (HCC) - Basic Metabolic Panel Patient told to avoid taking Naprosyn, ibuprofen, Aleve or Advil  6. Positive depression screening We did not get to address this today.  However I have requested an appointment for him to see our LCSW  Advised patient to bring all his medicine bottles with him on next visit so that I can visibly show him  and he can write down in Arabic what the medicines are for.  I tried to explain them today but I think a visual is needed. Patient was given the opportunity to ask questions.  Patient verbalized understanding of the plan and was able to repeat key elements of the plan.   No orders of the defined types were placed in this encounter.    Requested Prescriptions    No prescriptions requested or ordered in this encounter    Follow-up in 2 months Karle Plumber, MD, Rosalita Chessman

## 2017-05-23 NOTE — Patient Instructions (Signed)
Please give pt an appointment with [facility] for positive depression screen.  Next available. Pt prefers afternoon appointment.

## 2017-05-23 NOTE — Progress Notes (Signed)
Using medications that was brought from [place] Needs refill

## 2017-05-24 LAB — BASIC METABOLIC PANEL
BUN/Creatinine Ratio: 19 (ref 10–24)
BUN: 26 mg/dL (ref 8–27)
CO2: 22 mmol/L (ref 20–29)
Calcium: 9.1 mg/dL (ref 8.6–10.2)
Chloride: 106 mmol/L (ref 96–106)
Creatinine, Ser: 1.37 mg/dL — ABNORMAL HIGH (ref 0.76–1.27)
GFR, EST AFRICAN AMERICAN: 61 mL/min/{1.73_m2} (ref >59)
GFR, EST NON AFRICAN AMERICAN: 53 mL/min/{1.73_m2} — AB (ref >59)
Glucose: 108 mg/dL — ABNORMAL HIGH (ref 65–99)
POTASSIUM: 4.2 mmol/L (ref 3.5–5.2)
SODIUM: 144 mmol/L (ref 134–144)

## 2017-05-27 ENCOUNTER — Encounter (INDEPENDENT_AMBULATORY_CARE_PROVIDER_SITE_OTHER): Payer: Self-pay | Admitting: Radiology

## 2017-07-07 ENCOUNTER — Telehealth: Payer: Self-pay | Admitting: *Deleted

## 2017-07-07 NOTE — Telephone Encounter (Signed)
-----   Message from Marcine Matar, MD sent at 05/25/2017 10:12 AM EST ----- Let pt know that his kidney function has improved.

## 2017-07-07 NOTE — Telephone Encounter (Signed)
Medical Assistant used [employer] to contact patient.  Interpreter Name: Gretta Cool Interpreter #: [phone] Patient was not available, [employer] left patient a voicemail. Voicemail states to give a call back to [place] with [facility] at [phone]. !!!Please inform patient of kidney function improving!!!

## 2017-07-25 ENCOUNTER — Ambulatory Visit: Payer: Self-pay | Admitting: Internal Medicine

## 2017-08-08 ENCOUNTER — Ambulatory Visit: Payer: Self-pay

## 2017-08-21 ENCOUNTER — Ambulatory Visit: Payer: Self-pay

## 2017-09-03 ENCOUNTER — Ambulatory Visit: Payer: Self-pay | Attending: Internal Medicine

## 2018-01-20 ENCOUNTER — Ambulatory Visit: Payer: Self-pay | Attending: Internal Medicine

## 2018-01-28 ENCOUNTER — Ambulatory Visit: Payer: Self-pay

## 2018-02-25 ENCOUNTER — Ambulatory Visit: Payer: Self-pay | Attending: Internal Medicine

## 2018-03-05 ENCOUNTER — Ambulatory Visit: Payer: Self-pay | Attending: Internal Medicine | Admitting: Internal Medicine

## 2018-03-05 ENCOUNTER — Encounter: Payer: Self-pay | Admitting: Internal Medicine

## 2018-03-05 ENCOUNTER — Telehealth: Payer: Self-pay

## 2018-03-05 VITALS — BP 160/90 | HR 89 | Temp 98.1°F | Resp 16 | Wt 183.0 lb

## 2018-03-05 DIAGNOSIS — I10 Essential (primary) hypertension: Secondary | ICD-10-CM

## 2018-03-05 DIAGNOSIS — G8929 Other chronic pain: Secondary | ICD-10-CM | POA: Insufficient documentation

## 2018-03-05 DIAGNOSIS — M542 Cervicalgia: Secondary | ICD-10-CM | POA: Insufficient documentation

## 2018-03-05 DIAGNOSIS — Z23 Encounter for immunization: Secondary | ICD-10-CM | POA: Insufficient documentation

## 2018-03-05 DIAGNOSIS — M4726 Other spondylosis with radiculopathy, lumbar region: Secondary | ICD-10-CM | POA: Insufficient documentation

## 2018-03-05 DIAGNOSIS — N401 Enlarged prostate with lower urinary tract symptoms: Secondary | ICD-10-CM | POA: Insufficient documentation

## 2018-03-05 DIAGNOSIS — N183 Chronic kidney disease, stage 3 (moderate): Secondary | ICD-10-CM | POA: Insufficient documentation

## 2018-03-05 DIAGNOSIS — Z79899 Other long term (current) drug therapy: Secondary | ICD-10-CM | POA: Insufficient documentation

## 2018-03-05 DIAGNOSIS — I129 Hypertensive chronic kidney disease with stage 1 through stage 4 chronic kidney disease, or unspecified chronic kidney disease: Secondary | ICD-10-CM | POA: Insufficient documentation

## 2018-03-05 DIAGNOSIS — Z833 Family history of diabetes mellitus: Secondary | ICD-10-CM | POA: Insufficient documentation

## 2018-03-05 DIAGNOSIS — M170 Bilateral primary osteoarthritis of knee: Secondary | ICD-10-CM | POA: Insufficient documentation

## 2018-03-05 DIAGNOSIS — M5416 Radiculopathy, lumbar region: Secondary | ICD-10-CM

## 2018-03-05 MED ORDER — TRAMADOL HCL 50 MG PO TABS
50.0000 mg | ORAL_TABLET | Freq: Three times a day (TID) | ORAL | 1 refills | Status: DC | PRN
Start: 2018-03-05 — End: 2018-08-27

## 2018-03-05 NOTE — Telephone Encounter (Signed)
Met with patient and his wife to discuss billing concerns. [employer] Arabic interpreter # [phone]. Carlos Mosquera/Financial Counselor - [place] was also present and addressed the patient's questions regarding the [employer].  He informed the patient that he ( patient) can bring any bills to [facility] for him Clifton James) to review on Thursday mornings. The patient verbalized frustration that he continues to receive bills even when approved for CAFA. Clifton James also explained to him that an ambulance bills is a third party bill and [facility] is not responsible for that bill, the patient will need to call the company to inquire about a payment plan.   The patient was also instructed to go to the social security office to apply for medicaid.  He said that he had gone to DSS to apply for medicaid but was instructed to go to [employer] first.  Clifton James also informed him that an [employer] can still be used while applying for [employer].  The patient stated that he used to have food stamps but they were stopped when he was working.

## 2018-03-05 NOTE — Patient Instructions (Signed)

## 2018-03-05 NOTE — Progress Notes (Addendum)
Patient ID: Tyrone Mcdonald, male    DOB: 07-01-1951  MRN: [id number]  CC: Hypertension   Subjective: Tyrone Mcdonald is a 67 y.o. male who presents for chronic ds management.  Last seen 05/2017.  Wife is with him. His concerns today include:  Patient with history ofHTN,BPH,lumbar radiculopathy, OA BL knees  HTN:  BP elevated today.  Out of Amlodipine for a while.  He has RF on current rxn until 05/2018.  He no longer has insurance and is concern about how to pay for med.   LBP/OA knees:  Taking Ibuprofen 600 mg to try to control back pain so that he can continue to work. States he has to pay rent and all the bills and he is tired off it. Given Tramadol on last visit and referred for P.T. Found it helpful but he did not come back for RF because no longer had insurance and did not want to incur extra bills.  Was called for P.T x 3 but was unable to be reached.  States he did not understand the caller and would like future calls to be in Arabic.  He is also seeing orthopedics for his back and ESI was recommended but patient declined.  CKD:  Kidney function had improved on last blood test.  Wanting to know what he needs to do to get Medicare because he is over the age 50. Also has questions about OC.    Patient Active Problem List   Diagnosis Date Noted  . Osteoarthritis of both knees 05/23/2017  . CKD (chronic kidney disease) stage 3, GFR 30-59 ml/min (HCC) 05/23/2017  . Positive depression screening 05/23/2017  . Benign prostatic hyperplasia with urinary frequency 03/28/2017  . Lumbar radiculopathy 03/28/2017  . Chronic neck pain 03/28/2017  . Osteoarthritis of spine with radiculopathy, lumbar region 02/28/2016  . Language barrier affecting health care 02/28/2016  . Abnormal PSA 02/28/2016  . Essential hypertension 07/25/2014  . Family history of diabetes mellitus (DM) 07/25/2014  . Rhinitis, allergic 07/25/2014     Current Outpatient Medications on File Prior to  Visit  Medication Sig Dispense Refill  . amLODipine (NORVASC) 10 MG tablet Take 1 tablet (10 mg total) daily by mouth. 30 tablet 11  . methocarbamol (ROBAXIN) 500 MG tablet Take 1 tablet (500 mg total) 2 (two) times daily as needed by mouth for muscle spasms. 20 tablet 1  . tamsulosin (FLOMAX) 0.4 MG CAPS capsule Take 1 capsule (0.4 mg total) daily by mouth. 30 capsule 6   No current facility-administered medications on file prior to visit.     No Known Allergies  Social History   Socioeconomic History  . Marital status: Married    Spouse name: Not on file  . Number of children: Not on file  . Years of education: Not on file  . Highest education level: Not on file  Occupational History  . Not on file  Social Needs  . Financial resource strain: Not on file  . Food insecurity:    Worry: Not on file    Inability: Not on file  . Transportation needs:    Medical: Not on file    Non-medical: Not on file  Tobacco Use  . Smoking status: Never Smoker  . Smokeless tobacco: Never Used  Substance and Sexual Activity  . Alcohol use: No    Alcohol/week: 0.0 standard drinks  . Drug use: No  . Sexual activity: Not on file  Lifestyle  . Physical activity:  Days per week: Not on file    Minutes per session: Not on file  . Stress: Not on file  Relationships  . Social connections:    Talks on phone: Not on file    Gets together: Not on file    Attends religious service: Not on file    Active member of club or organization: Not on file    Attends meetings of clubs or organizations: Not on file    Relationship status: Not on file  . Intimate partner violence:    Fear of current or ex partner: Not on file    Emotionally abused: Not on file    Physically abused: Not on file    Forced sexual activity: Not on file  Other Topics Concern  . Not on file  Social History Narrative   ** Merged History Encounter **        Family History  Problem Relation Age of Onset  .  Hyperlipidemia Father   . Cancer Brother   . Diabetes Paternal Uncle     No past surgical history on file.  ROS: Review of Systems Negative except as above. PHYSICAL EXAM: BP (!) 160/90   Pulse 89   Temp 98.1 F (36.7 C) (Oral)   Resp 16   Wt 183 lb (83 kg)   SpO2 96%   BMI 31.41 kg/m   Repeat BP 160/90 Physical Exam General appearance - alert, well appearing, and in no distress Mental status - normal mood, behavior, speech, dress, motor activity, and thought processes Neck - supple, no significant adenopathy Chest - clear to auscultation, no wheezes, rales or rhonchi, symmetric air entry Heart - normal rate, regular rhythm, normal S1, S2, no murmurs, rubs, clicks or gallops Musculoskeletal -knees: Enlargement of the joints.  He has mild to moderate crepitus and discomfort on passive range of motion. Extremities -no lower extremity edema.  ASSESSMENT AND PLAN: 1. Essential hypertension Not at goal.  I had clinical pharmacist meet with him today to explain the refill process at our pharmacy and how to apply for [facility] card  2. Lumbar radiculopathy Control substance prescribing agreement discussed and pt signed. NCCSRS reviewed RF given on Tramadol to use PRN as before - traMADol (ULTRAM) 50 MG tablet; Take 1 tablet (50 mg total) by mouth every 8 (eight) hours as needed for moderate pain or severe pain.  Dispense: 90 tablet; Refill: 1 - Ambulatory referral to Physical Therapy  3. Osteoarthritis of both knees, unspecified osteoarthritis type See #2 above  4. Need for influenza vaccination - Flu Vaccine QUAD 6+ mos PF IM (Fluarix Quad PF)   Patient was given the opportunity to ask questions.  Patient verbalized understanding of the plan and was able to repeat key elements of the plan.  [employer] interpreter used during this encounter.   Orders Placed This Encounter  Procedures  . Flu Vaccine QUAD 6+ mos PF IM (Fluarix Quad PF)  . Ambulatory referral to Physical Therapy       Requested Prescriptions   Signed Prescriptions Disp Refills  . traMADol (ULTRAM) 50 MG tablet 90 tablet 1    Sig: Take 1 tablet (50 mg total) by mouth every 8 (eight) hours as needed for moderate pain or severe pain.    Return in about 2 months (around 05/05/2018).  Tyrone Blue, MD, FACP

## 2018-05-07 ENCOUNTER — Ambulatory Visit: Payer: Self-pay | Admitting: Internal Medicine

## 2018-06-28 ENCOUNTER — Emergency Department (HOSPITAL_COMMUNITY): Payer: Medicaid Other

## 2018-06-28 ENCOUNTER — Other Ambulatory Visit: Payer: Self-pay

## 2018-06-28 ENCOUNTER — Encounter (HOSPITAL_COMMUNITY): Payer: Self-pay

## 2018-06-28 ENCOUNTER — Emergency Department (HOSPITAL_COMMUNITY)
Admission: EM | Admit: 2018-06-28 | Discharge: 2018-06-28 | Disposition: A | Discharge status: Home / Self Care | Payer: Medicaid Other | Attending: Emergency Medicine | Admitting: Emergency Medicine

## 2018-06-28 DIAGNOSIS — R0602 Shortness of breath: Secondary | ICD-10-CM

## 2018-06-28 DIAGNOSIS — R0789 Other chest pain: Secondary | ICD-10-CM | POA: Insufficient documentation

## 2018-06-28 DIAGNOSIS — R0600 Dyspnea, unspecified: Secondary | ICD-10-CM | POA: Insufficient documentation

## 2018-06-28 DIAGNOSIS — Z8613 Personal history of malaria: Secondary | ICD-10-CM | POA: Diagnosis not present

## 2018-06-28 DIAGNOSIS — I129 Hypertensive chronic kidney disease with stage 1 through stage 4 chronic kidney disease, or unspecified chronic kidney disease: Secondary | ICD-10-CM | POA: Diagnosis not present

## 2018-06-28 DIAGNOSIS — N183 Chronic kidney disease, stage 3 (moderate): Secondary | ICD-10-CM | POA: Insufficient documentation

## 2018-06-28 LAB — CBC WITH DIFFERENTIAL/PLATELET
Abs Immature Granulocytes: 0.02 10*3/uL (ref 0.00–0.07)
BASOS ABS: 0.1 10*3/uL (ref 0.0–0.1)
Basophils Relative: 1 %
EOS ABS: 0.6 10*3/uL — AB (ref 0.0–0.5)
EOS PCT: 9 %
HEMATOCRIT: 40.6 % (ref 39.0–52.0)
Hemoglobin: 12.6 g/dL — ABNORMAL LOW (ref 13.0–17.0)
Immature Granulocytes: 0 %
LYMPHS ABS: 2.5 10*3/uL (ref 0.7–4.0)
Lymphocytes Relative: 40 %
MCH: 25.1 pg — ABNORMAL LOW (ref 26.0–34.0)
MCHC: 31 g/dL (ref 30.0–36.0)
MCV: 80.9 fL (ref 80.0–100.0)
Monocytes Absolute: 0.5 10*3/uL (ref 0.1–1.0)
Monocytes Relative: 8 %
NRBC: 0 % (ref 0.0–0.2)
Neutro Abs: 2.6 10*3/uL (ref 1.7–7.7)
Neutrophils Relative %: 42 %
Platelets: 224 10*3/uL (ref 150–400)
RBC: 5.02 MIL/uL (ref 4.22–5.81)
RDW: 13.1 % (ref 11.5–15.5)
WBC: 6.2 10*3/uL (ref 4.0–10.5)

## 2018-06-28 LAB — COMPREHENSIVE METABOLIC PANEL
ALT: 16 U/L (ref 0–44)
ANION GAP: 11 (ref 5–15)
AST: 16 U/L (ref 15–41)
Albumin: 3.4 g/dL — ABNORMAL LOW (ref 3.5–5.0)
Alkaline Phosphatase: 80 U/L (ref 38–126)
BUN: 20 mg/dL (ref 8–23)
CO2: 19 mmol/L — ABNORMAL LOW (ref 22–32)
Calcium: 8.8 mg/dL — ABNORMAL LOW (ref 8.9–10.3)
Chloride: 109 mmol/L (ref 98–111)
Creatinine, Ser: 1.32 mg/dL — ABNORMAL HIGH (ref 0.61–1.24)
GFR calc Af Amer: >60 mL/min (ref >60)
GFR calc non Af Amer: 55 mL/min — ABNORMAL LOW (ref >60)
Glucose, Bld: 107 mg/dL — ABNORMAL HIGH (ref 70–99)
POTASSIUM: 3.6 mmol/L (ref 3.5–5.1)
Sodium: 139 mmol/L (ref 135–145)
Total Bilirubin: 0.5 mg/dL (ref 0.3–1.2)
Total Protein: 7 g/dL (ref 6.5–8.1)

## 2018-06-28 LAB — TROPONIN I: Troponin I: <0.03 ng/mL (ref <0.03)

## 2018-06-28 LAB — D-DIMER, QUANTITATIVE: D-Dimer, Quant: 0.36 ug/mL-FEU (ref 0.00–0.50)

## 2018-06-28 MED ORDER — ALBUTEROL SULFATE (2.5 MG/3ML) 0.083% IN NEBU
5.0000 mg | INHALATION_SOLUTION | Freq: Once | RESPIRATORY_TRACT | Status: AC
  Administered 2018-06-28: 5 mg via RESPIRATORY_TRACT
  Filled 2018-06-28: qty 6

## 2018-06-28 MED ORDER — ALBUTEROL SULFATE HFA 108 (90 BASE) MCG/ACT IN AERS: 1.0000 | INHALATION_SPRAY | Freq: Four times a day (QID) | RESPIRATORY_TRACT | 0 refills | Status: DC | PRN

## 2018-06-28 MED ORDER — AMLODIPINE BESYLATE 10 MG PO TABS: 10.0000 mg | ORAL_TABLET | Freq: Every day | ORAL | 0 refills | Status: DC

## 2018-06-28 NOTE — ED Provider Notes (Signed)
[facility] EMERGENCY DEPARTMENT Provider Note   CSN: [id number] Arrival date & time: 06/28/18  2112     History   Chief Complaint Chief Complaint  Patient presents with  . Hypertension    HPI Tyrone Mcdonald is a 67 y.o. male.  Patient with history of HTN, recent Malaria (completed treatment last week), back pain, to ED for evaluation of shortness of breath starting about 2 months ago. He notices that when he takes his blood pressure medications the SOB resolves. No cough or fever. He has a history of HTN, treated at [facility] (Dr. Laural Benes). He is [place] and just returned from [place] one week ago. He states he ran out of his blood pressure medication while visiting home and saw a doctor there who started him on a medication. On his return, he saw his doctor and was continued on this medication as well as was given a second pill for blood pressure. He took them both this morning but could not find them for his afternoon dose, prompting ED visit for SOB. He reports chest pressure without pain. No nausea, vomiting, unilateral leg pain or swelling. No history of blood clots.   The history is provided by the patient. A language interpreter was used (via video interpreter).  Hypertension  Associated symptoms include shortness of breath. Pertinent negatives include no chest pain and no abdominal pain.    Past Medical History:  Diagnosis Date  . Chronic back pain   . Hypertension   . Sciatica     Patient Active Problem List   Diagnosis Date Noted  . Osteoarthritis of both knees 05/23/2017  . CKD (chronic kidney disease) stage 3, GFR 30-59 ml/min (HCC) 05/23/2017  . Positive depression screening 05/23/2017  . Benign prostatic hyperplasia with urinary frequency 03/28/2017  . Lumbar radiculopathy 03/28/2017  . Chronic neck pain 03/28/2017  . Osteoarthritis of spine with radiculopathy, lumbar region 02/28/2016  . Language barrier affecting  health care 02/28/2016  . Abnormal PSA 02/28/2016  . Essential hypertension 07/25/2014  . Family history of diabetes mellitus (DM) 07/25/2014  . Rhinitis, allergic 07/25/2014    History reviewed. No pertinent surgical history.      Home Medications    Prior to Admission medications   Medication Sig Start Date End Date Taking? Authorizing Provider  amLODipine (NORVASC) 10 MG tablet Take 1 tablet (10 mg total) daily by mouth. 05/23/17   Marcine Matar, MD  methocarbamol (ROBAXIN) 500 MG tablet Take 1 tablet (500 mg total) 2 (two) times daily as needed by mouth for muscle spasms. 05/23/17   Marcine Matar, MD  tamsulosin (FLOMAX) 0.4 MG CAPS capsule Take 1 capsule (0.4 mg total) daily by mouth. 05/23/17   Marcine Matar, MD  traMADol (ULTRAM) 50 MG tablet Take 1 tablet (50 mg total) by mouth every 8 (eight) hours as needed for moderate pain or severe pain. 03/05/18   Marcine Matar, MD    Family History Family History  Problem Relation Age of Onset  . Hyperlipidemia Father   . Cancer Brother   . Diabetes Paternal Uncle     Social History Social History   Tobacco Use  . Smoking status: Never Smoker  . Smokeless tobacco: Never Used  Substance Use Topics  . Alcohol use: No    Alcohol/week: 0.0 standard drinks  . Drug use: No     Allergies   Patient has no known allergies.   Review of Systems Review of Systems  Constitutional: Negative for chills and fever.  HENT: Negative.   Respiratory: Positive for chest tightness and shortness of breath. Negative for cough.   Cardiovascular: Negative.  Negative for chest pain and leg swelling.  Gastrointestinal: Negative.  Negative for abdominal pain and nausea.  Musculoskeletal: Negative.   Skin: Negative.   Neurological: Negative.  Negative for dizziness, syncope and weakness.     Physical Exam Updated Vital Signs BP (!) 151/88 (BP Location: Right Arm)   Pulse 84   Temp 98.1 F (36.7 C)   Resp 18   Wt  83 kg   SpO2 99%   BMI 31.41 kg/m   Physical Exam Vitals signs and nursing note reviewed.  Constitutional:      Appearance: He is well-developed.  HENT:     Head: Normocephalic.  Neck:     Musculoskeletal: Normal range of motion and neck supple.  Cardiovascular:     Rate and Rhythm: Normal rate and regular rhythm.  Pulmonary:     Effort: Pulmonary effort is normal.     Breath sounds: Normal breath sounds. No wheezing, rhonchi or rales.  Abdominal:     General: Bowel sounds are normal.     Palpations: Abdomen is soft.     Tenderness: There is no abdominal tenderness. There is no guarding or rebound.  Musculoskeletal: Normal range of motion.        General: No tenderness.     Right lower leg: No edema.     Left lower leg: No edema.  Skin:    General: Skin is warm and dry.     Findings: No rash.  Neurological:     General: No focal deficit present.     Mental Status: He is alert and oriented to person, place, and time.      ED Treatments / Results  Labs (all labs ordered are listed, but only abnormal results are displayed) Labs Reviewed  CBC WITH DIFFERENTIAL/PLATELET - Abnormal; Notable for the following components:      Result Value   Hemoglobin 12.6 (*)    MCH 25.1 (*)    Eosinophils Absolute 0.6 (*)    All other components within normal limits  D-DIMER, QUANTITATIVE (NOT AT [facility])  TROPONIN I  COMPREHENSIVE METABOLIC PANEL    EKG None  Radiology Dg Chest 2 View  Result Date: 06/28/2018 CLINICAL DATA:  Acute onset of shortness of breath. High blood pressure. EXAM: CHEST - 2 VIEW COMPARISON:  Chest radiograph performed 08/13/2015 FINDINGS: The lungs are well-aerated and clear. There is no evidence of focal opacification, pleural effusion or pneumothorax. The heart is normal in size; the mediastinal contour is within normal limits. No acute osseous abnormalities are seen. IMPRESSION: No acute cardiopulmonary process seen. Electronically Signed   By: Roanna Raider M.D.   On: 06/28/2018 22:17    Procedures Procedures (including critical care time)  Medications Ordered in ED Medications  albuterol (PROVENTIL) (2.5 MG/3ML) 0.083% nebulizer solution 5 mg (5 mg Nebulization Given 06/28/18 2216)     Initial Impression / Assessment and Plan / ED Course  I have reviewed the triage vital signs and the nursing notes.  Pertinent labs & imaging results that were available during my care of the patient were reviewed by me and considered in my medical decision making (see chart for details).     Patient to ED for evaluation of SOB x 2 months, better when he takes his blood pressure medications. No pain, but he reports pressure to chest. No  nausea. Recent travel to [place] where he was treated for Malaria, completing treatment last week.   VSS, no hypoxia, tachycardia or tachypnea. No fever. He is given a nebulizer treatment and reports his breathing is much better. CXR clear of fluid or infection. D dimer negative. Troponin/EKG negative. No evidence infection, PE, ACS.   He is considered stable for discharge home. Encourage PCP follow up. Will provide Rx for Norvasc as he can't find his.   Final Clinical Impressions(s) / ED Diagnoses   Final diagnoses:  None   1. Dyspnea  ED Discharge Orders    None       Danne Harbor 06/28/18 2312    Gerhard Munch, MD 06/29/18 0010

## 2018-06-28 NOTE — Discharge Instructions (Addendum)
Return to the ED with any new or concerning symptoms.  

## 2018-06-28 NOTE — ED Notes (Signed)
Pt alert and oriented x4. Ambulatory with steady gait. Verbalized understanding of d/c instructions. No further questions asked by patient.

## 2018-06-28 NOTE — ED Triage Notes (Signed)
Pt arrives POV for eval of hypertension and shortness of breath. Pt reports he came in today largely in part due to the shortness of breath. Pt reports he was in [place] and arrived back home 2 months ago. He states after his arrival back home he started w/ intermittent shortness of breath. Pt reports that when he feels short of breath he takes his blood pressure medicine and that relieves his SOB. Today, began to feel SOB and was unable to find his BP meds, thus causing him to present here. Pt appears in NARD, calm and cooperative. Triage completed via interpreter.

## 2018-07-07 NOTE — Progress Notes (Signed)
Patient ID: Valentina Lucks, male   DOB: 1950/09/02, 67 y.o.   MRN: [id number]       Giovanie Lefebre, is a 67 y.o. male  [id number]:[id number]  [id number]:[id number]  DOB - 1951-03-20  Subjective:  Chief Complaint and HPI: Tyrone Mcdonald is a 67 y.o. male here today to establish care and for a follow up visit After ED visit 06/28/2018 for SOB.  He recently completed malaria txt in the [place](about 3 weeks ago).    No SOB now.  Compliant with meds.  More fatigued than before malaria txt.  Needs prescription of his BP med.  Annabelle Harman with [employer] translating.    From ED A/P: Patient to ED for evaluation of SOB x 2 months, better when he takes his blood pressure medications. No pain, but he reports pressure to chest. No nausea. Recent travel to [place] where he was treated for Malaria, completing treatment last week.   VSS, no hypoxia, tachycardia or tachypnea. No fever. He is given a nebulizer treatment and reports his breathing is much better. CXR clear of fluid or infection. D dimer negative. Troponin/EKG negative. No evidence infection, PE, ACS.   ED/Hospital notes reviewed.   Social History:  From [place], works  ROS:   Constitutional:  No f/c, No night sweats, No unexplained weight loss. EENT:  No vision changes, No blurry vision, No hearing changes. No mouth, throat, or ear problems.  Respiratory: No cough, No SOB Cardiac: No CP, no palpitations GI:  No abd pain, No N/V/D. GU: No Urinary s/sx Musculoskeletal: No joint pain Neuro: No headache, no dizziness, no motor weakness.  Skin: No rash Endocrine:  No polydipsia. No polyuria.  Psych: Denies SI/HI  No problems updated.  ALLERGIES: No Known Allergies  PAST MEDICAL HISTORY: Past Medical History:  Diagnosis Date  . Chronic back pain   . Hypertension   . Sciatica     MEDICATIONS AT HOME: Prior to Admission medications   Medication Sig Start Date End Date Taking? Authorizing Provider  albuterol (PROVENTIL HFA;VENTOLIN  HFA) 108 (90 Base) MCG/ACT inhaler Inhale 1-2 puffs into the lungs every 6 (six) hours as needed for wheezing or shortness of breath. 06/28/18  Yes Upstill, Melvenia Beam, PA-C  amLODipine (NORVASC) 10 MG tablet Take 1 tablet (10 mg total) by mouth daily. 07/09/18  Yes McClung, Marzella Schlein, PA-C  methocarbamol (ROBAXIN) 500 MG tablet Take 1 tablet (500 mg total) 2 (two) times daily as needed by mouth for muscle spasms. 05/23/17  Yes Marcine Matar, MD  tamsulosin (FLOMAX) 0.4 MG CAPS capsule Take 1 capsule (0.4 mg total) daily by mouth. 05/23/17  Yes Marcine Matar, MD  traMADol (ULTRAM) 50 MG tablet Take 1 tablet (50 mg total) by mouth every 8 (eight) hours as needed for moderate pain or severe pain. 03/05/18  Yes Marcine Matar, MD     Objective:  EXAM:   Vitals:   07/09/18 0855  BP: (!) 146/88  Pulse: (!) 105  Resp: 18  Temp: 98.2 F (36.8 C)  TempSrc: Oral  SpO2: 96%  Weight: 172 lb (78 kg)  Height: 5\' 3"  (1.6 m)    General appearance : A&OX3. NAD. Non-toxic-appearing HEENT: Atraumatic and Normocephalic.  PERRLA. EOM intact. Neck: supple, no JVD. No cervical lymphadenopathy. No thyromegaly Chest/Lungs:  Breathing-non-labored, Good air entry bilaterally, breath sounds normal without rales, rhonchi, or wheezing  CVS: S1 S2 regular, no murmurs, gallops, rubs  Extremities: Bilateral Lower Ext shows no edema, both legs are warm to  touch with = pulse throughout Neurology:  CN II-XII grossly intact, Non focal.   Psych:  TP linear. J/I WNL. Normal speech. Appropriate eye contact and affect.  Skin:  No Rash  Data Review Lab Results  Component Value Date   HGBA1C 5.8 (H) 03/28/2017   HGBA1C 5.6 01/02/2016   HGBA1C 5.8 (H) 07/25/2014     Assessment & Plan   1. Essential hypertension Suboptimal, but good control-DASH diet. - amLODipine (NORVASC) 10 MG tablet; Take 1 tablet (10 mg total) by mouth daily.  Dispense: 90 tablet; Refill: 1 We have discussed target BP range and blood  pressure goal. I have advised patient to check BP regularly and to call us back or report to clinic if the numbers are consistently higher than 140/90. We discussed the importance of compliance with medical therapy and DASH diet recommended, consequences of uncontrolled hypertension discussed.   2. Encounter for examination following treatment at hospital Much improved  3. Language barrier affecting health care stratus interpreters used and additional time performing visit was required.      Patient have been counseled extensively about nutrition and exercise  Return in about 3 months (around 10/08/2018) for assign PCP;  recheck BP.  The patient was given clear instructions to go to ER or return to medical center if symptoms don't improve, worsen or new problems develop. The patient verbalized understanding. The patient was told to call to get lab results if they haven't heard anything in the next week.     Georgian Co, PA-C [facility] and [facility] [place], [place] [phone]   07/09/2018, 9:00 AM

## 2018-07-09 ENCOUNTER — Ambulatory Visit: Payer: Medicare Other | Attending: Internal Medicine | Admitting: Physician Assistant

## 2018-07-09 VITALS — BP 146/88 | HR 105 | Temp 98.2°F | Resp 18 | Ht 63.0 in | Wt 172.0 lb

## 2018-07-09 DIAGNOSIS — Z789 Other specified health status: Secondary | ICD-10-CM | POA: Diagnosis not present

## 2018-07-09 DIAGNOSIS — Z79899 Other long term (current) drug therapy: Secondary | ICD-10-CM | POA: Diagnosis not present

## 2018-07-09 DIAGNOSIS — Z09 Encounter for follow-up examination after completed treatment for conditions other than malignant neoplasm: Secondary | ICD-10-CM | POA: Insufficient documentation

## 2018-07-09 DIAGNOSIS — I10 Essential (primary) hypertension: Secondary | ICD-10-CM

## 2018-07-09 MED ORDER — AMLODIPINE BESYLATE 10 MG PO TABS: 10.0000 mg | ORAL_TABLET | Freq: Every day | ORAL | 1 refills | Status: DC

## 2018-08-01 ENCOUNTER — Emergency Department (HOSPITAL_COMMUNITY): Payer: Medicare Other

## 2018-08-01 ENCOUNTER — Encounter (HOSPITAL_COMMUNITY): Payer: Self-pay | Admitting: Emergency Medicine

## 2018-08-01 ENCOUNTER — Emergency Department (HOSPITAL_COMMUNITY)
Admission: EM | Admit: 2018-08-01 | Discharge: 2018-08-01 | Disposition: A | Discharge status: Home / Self Care | Payer: Medicare Other | Attending: Emergency Medicine | Admitting: Emergency Medicine

## 2018-08-01 ENCOUNTER — Other Ambulatory Visit: Payer: Self-pay

## 2018-08-01 DIAGNOSIS — N183 Chronic kidney disease, stage 3 (moderate): Secondary | ICD-10-CM | POA: Diagnosis not present

## 2018-08-01 DIAGNOSIS — I129 Hypertensive chronic kidney disease with stage 1 through stage 4 chronic kidney disease, or unspecified chronic kidney disease: Secondary | ICD-10-CM | POA: Insufficient documentation

## 2018-08-01 DIAGNOSIS — M25561 Pain in right knee: Secondary | ICD-10-CM | POA: Diagnosis not present

## 2018-08-01 DIAGNOSIS — M542 Cervicalgia: Secondary | ICD-10-CM | POA: Diagnosis not present

## 2018-08-01 DIAGNOSIS — M546 Pain in thoracic spine: Secondary | ICD-10-CM | POA: Insufficient documentation

## 2018-08-01 DIAGNOSIS — Z79899 Other long term (current) drug therapy: Secondary | ICD-10-CM | POA: Insufficient documentation

## 2018-08-01 DIAGNOSIS — M545 Low back pain: Secondary | ICD-10-CM | POA: Diagnosis not present

## 2018-08-01 DIAGNOSIS — I10 Essential (primary) hypertension: Secondary | ICD-10-CM | POA: Diagnosis not present

## 2018-08-01 DIAGNOSIS — M5489 Other dorsalgia: Secondary | ICD-10-CM | POA: Diagnosis not present

## 2018-08-01 DIAGNOSIS — M5126 Other intervertebral disc displacement, lumbar region: Secondary | ICD-10-CM | POA: Insufficient documentation

## 2018-08-01 LAB — CBC WITH DIFFERENTIAL/PLATELET
Abs Immature Granulocytes: 0.01 10*3/uL (ref 0.00–0.07)
Basophils Absolute: 0 10*3/uL (ref 0.0–0.1)
Basophils Relative: 1 %
Eosinophils Absolute: 0.3 10*3/uL (ref 0.0–0.5)
Eosinophils Relative: 7 %
HCT: 41.5 % (ref 39.0–52.0)
Hemoglobin: 12.8 g/dL — ABNORMAL LOW (ref 13.0–17.0)
Immature Granulocytes: 0 %
Lymphocytes Relative: 36 %
Lymphs Abs: 1.7 10*3/uL (ref 0.7–4.0)
MCH: 26.2 pg (ref 26.0–34.0)
MCHC: 30.8 g/dL (ref 30.0–36.0)
MCV: 84.9 fL (ref 80.0–100.0)
Monocytes Absolute: 0.3 10*3/uL (ref 0.1–1.0)
Monocytes Relative: 7 %
Neutro Abs: 2.3 10*3/uL (ref 1.7–7.7)
Neutrophils Relative %: 49 %
Platelets: 196 10*3/uL (ref 150–400)
RBC: 4.89 MIL/uL (ref 4.22–5.81)
RDW: 14.1 % (ref 11.5–15.5)
WBC: 4.6 10*3/uL (ref 4.0–10.5)
nRBC: 0 % (ref 0.0–0.2)

## 2018-08-01 LAB — URINALYSIS, ROUTINE W REFLEX MICROSCOPIC
Bacteria, UA: NONE SEEN
Bilirubin Urine: NEGATIVE
Glucose, UA: NEGATIVE mg/dL
Ketones, ur: NEGATIVE mg/dL
Leukocytes, UA: NEGATIVE
Nitrite: NEGATIVE
Protein, ur: NEGATIVE mg/dL
Specific Gravity, Urine: 1.013 (ref 1.005–1.030)
pH: 5 (ref 5.0–8.0)

## 2018-08-01 LAB — BASIC METABOLIC PANEL
Anion gap: 8 (ref 5–15)
BUN: 19 mg/dL (ref 8–23)
CO2: 23 mmol/L (ref 22–32)
Calcium: 9.2 mg/dL (ref 8.9–10.3)
Chloride: 108 mmol/L (ref 98–111)
Creatinine, Ser: 1.24 mg/dL (ref 0.61–1.24)
GFR calc Af Amer: >60 mL/min (ref >60)
GFR calc non Af Amer: 59 mL/min — ABNORMAL LOW (ref >60)
Glucose, Bld: 94 mg/dL (ref 70–99)
Potassium: 3.8 mmol/L (ref 3.5–5.1)
Sodium: 139 mmol/L (ref 135–145)

## 2018-08-01 MED ORDER — OXYCODONE-ACETAMINOPHEN 5-325 MG PO TABS: 1.0000 | ORAL_TABLET | Freq: Four times a day (QID) | ORAL | 0 refills | Status: DC | PRN

## 2018-08-01 MED ORDER — OXYCODONE-ACETAMINOPHEN 5-325 MG PO TABS
1.0000 | ORAL_TABLET | Freq: Once | ORAL | Status: AC
  Administered 2018-08-01: 1 via ORAL
  Filled 2018-08-01: qty 1

## 2018-08-01 MED ORDER — MORPHINE SULFATE (PF) 4 MG/ML IV SOLN
4.0000 mg | Freq: Once | INTRAVENOUS | Status: AC
  Administered 2018-08-01: 4 mg via INTRAVENOUS
  Filled 2018-08-01: qty 1

## 2018-08-01 NOTE — ED Notes (Signed)
Pt in XRAY and CT

## 2018-08-01 NOTE — ED Provider Notes (Signed)
[facility]-EMERGENCY DEPT Provider Note   CSN: [id number] Arrival date & time: 08/01/18  0458     History   Chief Complaint Chief Complaint  Patient presents with  . Knee Pain    bilateral   . Back Pain  . Neck Pain  . Urinary Retention    burning     HPI Tyrone Mcdonald is a 68 y.o. male with history of hypertension, chronic back pain and chronic knee pain who presents following fall.  Patient reports with neck and back pain as well as bilateral knee pain.  He also reports decreased sensation to his lower legs and is unable to move his lower extremities.  He reports it is not because of pain that he cannot actually move them.  He did not hit his head when he fell and did not lose consciousness.  He reports he got up in middle the night to go the bathroom and tripped over a table.  He reports pain in his knees that is chronic.  He reports he had 5 injections in each knee 20 days ago and they have not helped.  He denies any fevers.  He reports a weak urine stream since yesterday, before the fall.  Patient denies any chest pain or shortness of breath, abdominal pain, nausea, vomiting.  The history is provided by the patient. The history is limited by a language barrier. A language interpreter was used.    Past Medical History:  Diagnosis Date  . Chronic back pain   . Hypertension   . Sciatica     Patient Active Problem List   Diagnosis Date Noted  . Osteoarthritis of both knees 05/23/2017  . CKD (chronic kidney disease) stage 3, GFR 30-59 ml/min (HCC) 05/23/2017  . Positive depression screening 05/23/2017  . Benign prostatic hyperplasia with urinary frequency 03/28/2017  . Lumbar radiculopathy 03/28/2017  . Chronic neck pain 03/28/2017  . Osteoarthritis of spine with radiculopathy, lumbar region 02/28/2016  . Language barrier affecting health care 02/28/2016  . Abnormal PSA 02/28/2016  . Essential hypertension 07/25/2014  . Family history  of diabetes mellitus (DM) 07/25/2014  . Rhinitis, allergic 07/25/2014    History reviewed. No pertinent surgical history.      Home Medications    Prior to Admission medications   Medication Sig Start Date End Date Taking? Authorizing Provider  albuterol (PROVENTIL HFA;VENTOLIN HFA) 108 (90 Base) MCG/ACT inhaler Inhale 1-2 puffs into the lungs every 6 (six) hours as needed for wheezing or shortness of breath. 06/28/18  Yes Upstill, Melvenia BeamShari, PA-C  amLODipine (NORVASC) 10 MG tablet Take 1 tablet (10 mg total) by mouth daily. 07/09/18  Yes Georgian CoMcClung, Angela M, PA-C  ibuprofen (ADVIL,MOTRIN) 200 MG tablet Take 600 mg by mouth every 6 (six) hours as needed for mild pain.   Yes [provider]  methocarbamol (ROBAXIN) 500 MG tablet Take 1 tablet (500 mg total) 2 (two) times daily as needed by mouth for muscle spasms. Patient not taking: Reported on 08/01/2018 05/23/17   Marcine MatarJohnson, Deborah B, MD  oxyCODONE-acetaminophen (PERCOCET/ROXICET) 5-325 MG tablet Take 1-2 tablets by mouth every 6 (six) hours as needed for severe pain. 08/01/18   Emi HolesLaw, Desira Alessandrini M, PA-C  tamsulosin (FLOMAX) 0.4 MG CAPS capsule Take 1 capsule (0.4 mg total) daily by mouth. Patient not taking: Reported on 08/01/2018 05/23/17   Marcine MatarJohnson, Deborah B, MD  traMADol (ULTRAM) 50 MG tablet Take 1 tablet (50 mg total) by mouth every 8 (eight) hours as needed  for moderate pain or severe pain. Patient not taking: Reported on 08/01/2018 03/05/18   Marcine Matar, MD    Family History Family History  Problem Relation Age of Onset  . Hyperlipidemia Father   . Cancer Brother   . Diabetes Paternal Uncle     Social History Social History   Tobacco Use  . Smoking status: Never Smoker  . Smokeless tobacco: Never Used  Substance Use Topics  . Alcohol use: No    Alcohol/week: 0.0 standard drinks  . Drug use: No     Allergies   Patient has no known allergies.   Review of Systems Review of Systems  Constitutional: Negative  for chills and fever.  HENT: Negative for facial swelling and sore throat.   Respiratory: Negative for shortness of breath.   Cardiovascular: Negative for chest pain.  Gastrointestinal: Negative for abdominal pain, nausea and vomiting.  Genitourinary: Positive for dysuria.  Musculoskeletal: Positive for arthralgias and back pain.  Skin: Negative for rash and wound.  Neurological: Positive for numbness. Negative for headaches.  Psychiatric/Behavioral: The patient is not nervous/anxious.      Physical Exam Updated Vital Signs BP (!) 150/62   Pulse 72   Temp (!) 97.4 F (36.3 C) (Oral)   Resp 18   Ht  (1.626 m)   Wt 74.8 kg   SpO2 100%   BMI 28.32 kg/m   Physical Exam Vitals signs and nursing note reviewed.  Constitutional:      General: He is not in acute distress.    Appearance: He is well-developed. He is not diaphoretic.  HENT:     Head: Normocephalic and atraumatic.     Mouth/Throat:     Pharynx: No oropharyngeal exudate.  Eyes:     General: No scleral icterus.       Right eye: No discharge.        Left eye: No discharge.     Extraocular Movements: Extraocular movements intact.     Conjunctiva/sclera: Conjunctivae normal.     Pupils: Pupils are equal, round, and reactive to light.  Neck:     Musculoskeletal: Normal range of motion and neck supple.     Thyroid: No thyromegaly.  Cardiovascular:     Rate and Rhythm: Normal rate and regular rhythm.     Heart sounds: Normal heart sounds. No murmur. No friction rub. No gallop.   Pulmonary:     Effort: Pulmonary effort is normal. No respiratory distress.     Breath sounds: Normal breath sounds. No stridor. No wheezing or rales.  Abdominal:     General: Bowel sounds are normal. There is no distension.     Palpations: Abdomen is soft.     Tenderness: There is no abdominal tenderness. There is no guarding or rebound.  Musculoskeletal:     Comments: Bilateral knee tenderness with no significant effusion, no warmth  or erythema, pain with range of motion bilaterally Tenderness to bilateral thighs as well Midline cervical, thoracic, and lumbar tenderness  Lymphadenopathy:     Cervical: No cervical adenopathy.  Skin:    General: Skin is warm and dry.     Coloration: Skin is not pale.     Findings: No rash.  Neurological:     Mental Status: He is alert.     Coordination: Coordination normal.     Comments: Patient will not move his lower extremities bilaterally, strength testing unable to be completed, sensation is intact, patient reports it feels numb on his feet Equal  bilateral grip strength and 5/5 strength to bilateral upper extremities      ED Treatments / Results  Labs (all labs ordered are listed, but only abnormal results are displayed) Labs Reviewed  URINALYSIS, ROUTINE W REFLEX MICROSCOPIC - Abnormal; Notable for the following components:      Result Value   Hgb urine dipstick SMALL (*)    All other components within normal limits  CBC WITH DIFFERENTIAL/PLATELET - Abnormal; Notable for the following components:   Hemoglobin 12.8 (*)    All other components within normal limits  BASIC METABOLIC PANEL - Abnormal; Notable for the following components:   GFR calc non Af Amer 59 (*)    All other components within normal limits    EKG None  Radiology Dg Thoracic Spine W/swimmers  Result Date: 08/01/2018 CLINICAL DATA:  Thoracic back pain, status post fall. EXAM: THORACIC SPINE - 3 VIEWS COMPARISON:  None. FINDINGS: Mild degenerative spurring throughout the slightly scoliotic thoracic spine. No acute fracture or evidence of acute vertebral body subluxation. Visualized paravertebral soft tissues are unremarkable. IMPRESSION: No acute findings. Mild degenerative changes. Electronically Signed   By: Bary Richard M.D.   On: 08/01/2018 07:58   Dg Lumbar Spine Complete  Result Date: 08/01/2018 CLINICAL DATA:  Back pain, status post fall. EXAM: LUMBAR SPINE - COMPLETE 4+ VIEW COMPARISON:   Plain film of the lumbar spine dated 08/13/2015. FINDINGS: Mild degenerative spondylosis within the lower lumbar spine with associated mild osseous spurring and facet arthropathy. Mild chronic compression deformity at the superior endplate of the L1 vertebral body. No acute appearing osseous abnormality. No evidence of acute fracture or subluxation. No evidence of pars interarticularis defect. Visualized paravertebral soft tissues are unremarkable. IMPRESSION: 1. No acute findings. 2. Chronic/degenerative findings detailed above. Electronically Signed   By: Bary Richard M.D.   On: 08/01/2018 07:56   Ct Cervical Spine Wo Contrast  Result Date: 08/01/2018 CLINICAL DATA:  Fall last night, neck pain. EXAM: CT CERVICAL SPINE WITHOUT CONTRAST TECHNIQUE: Multidetector CT imaging of the cervical spine was performed without intravenous contrast. Multiplanar CT image reconstructions were also generated. COMPARISON:  None. FINDINGS: Alignment: Mild levoscoliosis. No evidence of acute vertebral body subluxation. Skull base and vertebrae: No fracture line or displaced fracture fragment appreciated. Facet joints appear normally aligned throughout. Soft tissues and spinal canal: No prevertebral fluid or swelling. No visible canal hematoma. Disc levels: Degenerative spondylosis throughout the cervical spine, most pronounced at the C5-6 through 2 C7-T1 levels with associated disc space narrowings and osseous spurring, causing mild to moderate central canal stenoses. Upper chest: No acute findings. Other: Carotid atherosclerosis. IMPRESSION: 1. No acute findings. No fracture or acute subluxation within the cervical spine. 2. Degenerative spondylosis throughout the cervical spine, most prominent within the lower cervical spine, as detailed above. 3. Carotid atherosclerosis. Electronically Signed   By: Bary Richard M.D.   On: 08/01/2018 08:01   Mr Thoracic Spine Wo Contrast  Result Date: 08/01/2018 CLINICAL DATA:  Low back  pain EXAM: MRI THORACIC SPINE WITHOUT CONTRAST TECHNIQUE: Multiplanar, multisequence MR imaging of the thoracic spine was performed. No intravenous contrast was administered. COMPARISON:  None. FINDINGS: Alignment: Mild exaggerated thoracic kyphosis. Slight degenerative anterolisthesis at C7-T1. Vertebrae: No acute fracture, evidence of discitis, or bone lesion. Remote L1 superior endplate fracture. Cord:  Normal signal and morphology Paraspinal and other soft tissues: Bilateral renal cystic intensities. Disc levels: Generalized spondylosis. No significant disc herniation. No impingement or notable facet spurring. IMPRESSION: 1. No acute finding.  2. Spondylosis without impingement. Electronically Signed   By: Marnee SpringJonathon  Watts M.D.   On: 08/01/2018 10:14   Mr Lumbar Spine Wo Contrast  Result Date: 08/01/2018 CLINICAL DATA:  Lower back pain EXAM: MRI LUMBAR SPINE WITHOUT CONTRAST TECHNIQUE: Multiplanar, multisequence MR imaging of the lumbar spine was performed. No intravenous contrast was administered. COMPARISON:  Radiography from earlier today FINDINGS: Segmentation:  5 lumbar type vertebral bodies Alignment:  Unremarkable Vertebrae: Remote L1 superior endplate fracture. No acute fracture, discitis, or aggressive bone lesion. Conus medullaris and cauda equina: Conus extends to the T12-L1 level. Conus and cauda equina appear normal. Paraspinal and other soft tissues: Renal cystic intensities. Disc levels: T12- L1: Minor spondylosis. L1-L2: Unremarkable. L2-L3: Mild disc narrowing and bulging. L3-L4: Spondylosis with disc narrowing and bulging. Mild posterior element hypertrophy. Moderate left foraminal narrowing, mainly discogenic. L4-L5: Facet degeneration with spurring and left more than right joint fluid. The disc is bulging and there is bilateral subarticular recess narrowing that could affect the L5 nerve roots. Both foramina are patent L5-S1:Greatest level of degenerative disc narrowing with a right  paracentral protrusion causing mild mass effect on the right S1 nerve root. Mild facet spurring. The foramina are patent IMPRESSION: 1. No acute finding. 2. Remote L1 superior endplate fracture. 3. Disc and facet degeneration as described. 4. L3-4 moderate left foraminal stenosis. 5. L4-5 bilateral subarticular recess narrowing that could affect either L5 nerve root. 6. L5-S1 right paracentral herniation with mild S1 mass-effect. Electronically Signed   By: Marnee SpringJonathon  Watts M.D.   On: 08/01/2018 10:17   Dg Knee Complete 4 Views Left  Result Date: 08/01/2018 CLINICAL DATA:  Bilateral knee pain and back pain, status post fall. EXAM: LEFT KNEE - COMPLETE 4+ VIEW COMPARISON:  None. FINDINGS: Mild degenerative narrowing of the medial compartment with associated mild osseous spurring. Additional mild degenerative osseous spurring at the patellofemoral compartment. Lateral compartment is relatively well maintained. No acute appearing osseous abnormality. No fracture line or displaced fracture fragment seen. No appreciable joint effusion and adjacent soft tissues are unremarkable. IMPRESSION: No acute findings. Mild degenerative change. Electronically Signed   By: Bary RichardStan  Maynard M.D.   On: 08/01/2018 07:54   Dg Knee Complete 4 Views Right  Result Date: 08/01/2018 CLINICAL DATA:  Chronic bilateral knee pain and back pain, status post fall. EXAM: RIGHT KNEE - COMPLETE 4+ VIEW COMPARISON:  None. FINDINGS: Degenerative narrowing of the medial compartment, mild to moderate in degree, with associated mild osseous spurring. Additional mild degenerative spurring at the patellofemoral compartment. Lateral compartment is relatively well preserved. No acute appearing osseous abnormality. No fracture line or displaced fracture fragment seen. No convincing joint effusion and adjacent soft tissues are unremarkable. IMPRESSION: 1. Degenerative changes, as detailed above. 2. No acute findings. Electronically Signed   By: Bary RichardStan   Maynard M.D.   On: 08/01/2018 07:53    Procedures Procedures (including critical care time)  Medications Ordered in ED Medications  morphine 4 MG/ML injection 4 mg (4 mg Intravenous Given 08/01/18 0820)  oxyCODONE-acetaminophen (PERCOCET/ROXICET) 5-325 MG per tablet 1 tablet (1 tablet Oral Given 08/01/18 1146)     Initial Impression / Assessment and Plan / ED Course  I have reviewed the triage vital signs and the nursing notes.  Pertinent labs & imaging results that were available during my care of the patient were reviewed by me and considered in my medical decision making (see chart for details).     Patient presenting with severe low back pain and inability to  move his legs and tingling after fall.  He reports he has chronic back pain, however it is worse since the fall.  Patient initially would not move his legs.  MRI of the T and L-spine were conducted.  MRI thoracic is negative.  Lumbar shows Remote L1 superior endplate fracture, Disc and facet degeneration,  L3-4 moderate left foraminal stenosis, L4-5 bilateral subarticular recess narrowing that could affect either L5 nerve root, L5-S1 right paracentral herniation with mild S1 mass-effect.  Patient given morphine and feeling much better.  He is able to move his legs and has 5/5 strength with sensation intact, but mildly decreased at the feet.  He is able to walk slowly with assistance.  I discussed the MRI findings with Dorien Chihuahua with neurosurgery who advised patient can follow-up outpatient if pain is controlled. Labs are unremarkable.  UA shows trace hematuria.  Patient to follow-up with PCP for weaker stream.  Regarding the knees, there is no warmth or erythema, there is full range of motion after pain control.  Low suspicion of septic joint.  There is no effusion on x-ray.  Patient be discharged home with short course of Percocet.  I reviewed the [place] narcotic database and found no discrepancies.  Patient tolerated Percocet in the  ED without issue.  He is advised not to drive or operate machinery with it.  Return precautions discussed.  Patient understands and agrees with plan.  Patient vital stable throughout ED course and discharged in satisfactory condition. I discussed patient case with Dr. Erma Heritage who guided the patient's management and agrees with plan.   Final Clinical Impressions(s) / ED Diagnoses   Final diagnoses:  Thoracic back pain  Lumbar disc herniation    ED Discharge Orders         Ordered    oxyCODONE-acetaminophen (PERCOCET/ROXICET) 5-325 MG tablet  Every 6 hours PRN     08/01/18 [address] PA-C 08/01/18 1502    Shaune Pollack, MD 08/01/18 2005

## 2018-08-01 NOTE — ED Notes (Signed)
Bed: XI50 Expected date:  Expected time:  Means of arrival:  Comments: EMS male chronic knee and back pain

## 2018-08-01 NOTE — ED Notes (Signed)
On [facility]  ----- Pt was at [facility] for SOB and HTN Given meds  ----- Weak urine stream  ------ Fall on table tonight wife called EMS today  ------

## 2018-08-01 NOTE — Discharge Instructions (Addendum)
Take Percocet every 6 hours as needed for severe pain.  Please follow-up with Dr. Val Riles office for further evaluation and treatment of your disc herniation.  Please return to the emergency department if you develop bowel or bladder incontinence, complete numbness of your legs or groin, or any other new or concerning symptoms.  Do not drink alcohol, drive, operate machinery or participate in any other potentially dangerous activities while taking opiate pain medication as it may make you sleepy. Do not take this medication with any other sedating medications, either prescription or over-the-counter. If you were prescribed Percocet or Vicodin, do not take these with acetaminophen (Tylenol) as it is already contained within these medications and overdose of Tylenol is dangerous.   This medication is an opiate (or narcotic) pain medication and can be habit forming.  Use it as little as possible to achieve adequate pain control.  Do not use or use it with extreme caution if you have a history of opiate abuse or dependence. This medication is intended for your use only - do not give any to anyone else and keep it in a secure place where nobody else, especially children, have access to it. It will also cause or worsen constipation, so you may want to consider taking an over-the-counter stool softener while you are taking this medication.

## 2018-08-01 NOTE — ED Triage Notes (Signed)
Pt comes to ed via ems, chronic bilateral knee and back pain. Pt had 5 injections in his knee 20 days ago.   Thigh ness on back right side .  degenalize disc.  Arrived to [place] 20 days ago, speaks arabic, from [place] recently. Pt comes from [address] [place].  V/s bp 162/100, rr, 88 , rr16- 18. spo2 98 room air.

## 2018-08-12 DIAGNOSIS — M545 Low back pain: Secondary | ICD-10-CM | POA: Diagnosis not present

## 2018-08-27 ENCOUNTER — Other Ambulatory Visit: Payer: Self-pay

## 2018-08-27 ENCOUNTER — Encounter (HOSPITAL_COMMUNITY): Payer: Self-pay | Admitting: Emergency Medicine

## 2018-08-27 ENCOUNTER — Emergency Department (HOSPITAL_COMMUNITY)
Admission: EM | Admit: 2018-08-27 | Discharge: 2018-08-27 | Disposition: A | Discharge status: Home / Self Care | Payer: Medicare Other | Attending: Emergency Medicine | Admitting: Emergency Medicine

## 2018-08-27 DIAGNOSIS — I129 Hypertensive chronic kidney disease with stage 1 through stage 4 chronic kidney disease, or unspecified chronic kidney disease: Secondary | ICD-10-CM | POA: Insufficient documentation

## 2018-08-27 DIAGNOSIS — N183 Chronic kidney disease, stage 3 (moderate): Secondary | ICD-10-CM | POA: Insufficient documentation

## 2018-08-27 DIAGNOSIS — G8929 Other chronic pain: Secondary | ICD-10-CM | POA: Diagnosis not present

## 2018-08-27 DIAGNOSIS — Z79899 Other long term (current) drug therapy: Secondary | ICD-10-CM | POA: Insufficient documentation

## 2018-08-27 DIAGNOSIS — M5441 Lumbago with sciatica, right side: Secondary | ICD-10-CM | POA: Diagnosis not present

## 2018-08-27 DIAGNOSIS — M545 Low back pain: Secondary | ICD-10-CM | POA: Diagnosis present

## 2018-08-27 DIAGNOSIS — M542 Cervicalgia: Secondary | ICD-10-CM

## 2018-08-27 MED ORDER — OXYCODONE-ACETAMINOPHEN 5-325 MG PO TABS: 1.0000 | ORAL_TABLET | Freq: Four times a day (QID) | ORAL | 0 refills | Status: DC | PRN

## 2018-08-27 MED ORDER — NAPROXEN 375 MG PO TABS: 375.0000 mg | ORAL_TABLET | Freq: Two times a day (BID) | ORAL | 0 refills | Status: DC

## 2018-08-27 MED ORDER — ACETAMINOPHEN 325 MG PO TABS
650.0000 mg | ORAL_TABLET | Freq: Once | ORAL | Status: AC
  Administered 2018-08-27: 650 mg via ORAL
  Filled 2018-08-27: qty 2

## 2018-08-27 MED ORDER — METHOCARBAMOL 500 MG PO TABS: 500.0000 mg | ORAL_TABLET | Freq: Four times a day (QID) | ORAL | 0 refills | Status: DC | PRN

## 2018-08-27 MED ORDER — KETOROLAC TROMETHAMINE 30 MG/ML IJ SOLN
15.0000 mg | Freq: Once | INTRAMUSCULAR | Status: AC
  Administered 2018-08-27: 15 mg via INTRAVENOUS
  Filled 2018-08-27: qty 1

## 2018-08-27 MED ORDER — METHOCARBAMOL 1000 MG/10ML IJ SOLN
1000.0000 mg | INTRAVENOUS | Status: DC
  Administered 2018-08-27: 1000 mg via INTRAVENOUS
  Filled 2018-08-27: qty 10

## 2018-08-27 NOTE — Discharge Instructions (Addendum)
Follow up with your neurosurgeon

## 2018-08-27 NOTE — ED Provider Notes (Signed)
[facility] EMERGENCY DEPARTMENT Provider Note   CSN: [id number] Arrival date & time: 08/27/18  9532    History   Chief Complaint Chief Complaint  Patient presents with  . Back Pain    HPI Tyrone Mcdonald is a 68 y.o. male.  The history is provided by the patient.  He has history of hypertension, chronic kidney disease, chronic back pain and comes in complaining of pain in his lower back and in his neck.  Back pain radiates down the right leg.  He is unable to put a number on the pain but states it is severe.  He had been seen several weeks ago at [facility] and referred to neurosurgery for follow-up.  In talking with him, I am not clear if he has seen the neurosurgeon.  He denies weakness, numbness, tingling.  He has decreased force of urinary stream but no urinary or fecal incontinence, no urinary retention.  He states that he had run out of his prescription for oxycodone-acetaminophen.  Pain got worse yesterday.  He denies any trauma or unusual activity.  Past Medical History:  Diagnosis Date  . Chronic back pain   . Hypertension   . Sciatica     Patient Active Problem List   Diagnosis Date Noted  . Osteoarthritis of both knees 05/23/2017  . CKD (chronic kidney disease) stage 3, GFR 30-59 ml/min (HCC) 05/23/2017  . Positive depression screening 05/23/2017  . Benign prostatic hyperplasia with urinary frequency 03/28/2017  . Lumbar radiculopathy 03/28/2017  . Chronic neck pain 03/28/2017  . Osteoarthritis of spine with radiculopathy, lumbar region 02/28/2016  . Language barrier affecting health care 02/28/2016  . Abnormal PSA 02/28/2016  . Essential hypertension 07/25/2014  . Family history of diabetes mellitus (DM) 07/25/2014  . Rhinitis, allergic 07/25/2014    History reviewed. No pertinent surgical history.      Home Medications    Prior to Admission medications   Medication Sig Start Date End Date Taking? Authorizing  Provider  albuterol (PROVENTIL HFA;VENTOLIN HFA) 108 (90 Base) MCG/ACT inhaler Inhale 1-2 puffs into the lungs every 6 (six) hours as needed for wheezing or shortness of breath. 06/28/18   Elpidio Anis, PA-C  amLODipine (NORVASC) 10 MG tablet Take 1 tablet (10 mg total) by mouth daily. 07/09/18   Anders Simmonds, PA-C  ibuprofen (ADVIL,MOTRIN) 200 MG tablet Take 600 mg by mouth every 6 (six) hours as needed for mild pain.    [provider]  methocarbamol (ROBAXIN) 500 MG tablet Take 1 tablet (500 mg total) 2 (two) times daily as needed by mouth for muscle spasms. Patient not taking: Reported on 08/01/2018 05/23/17   Marcine Matar, MD  oxyCODONE-acetaminophen (PERCOCET/ROXICET) 5-325 MG tablet Take 1-2 tablets by mouth every 6 (six) hours as needed for severe pain. 08/01/18   Emi Holes, PA-C  tamsulosin (FLOMAX) 0.4 MG CAPS capsule Take 1 capsule (0.4 mg total) daily by mouth. Patient not taking: Reported on 08/01/2018 05/23/17   Marcine Matar, MD  traMADol (ULTRAM) 50 MG tablet Take 1 tablet (50 mg total) by mouth every 8 (eight) hours as needed for moderate pain or severe pain. Patient not taking: Reported on 08/01/2018 03/05/18   Marcine Matar, MD    Family History Family History  Problem Relation Age of Onset  . Hyperlipidemia Father   . Cancer Brother   . Diabetes Paternal Uncle     Social History Social History   Tobacco Use  .  Smoking status: Never Smoker  . Smokeless tobacco: Never Used  Substance Use Topics  . Alcohol use: No    Alcohol/week: 0.0 standard drinks  . Drug use: No     Allergies   Patient has no known allergies.   Review of Systems Review of Systems  All other systems reviewed and are negative.    Physical Exam Updated Vital Signs BP 139/89   Pulse 81   Temp (!) 97.5 F (36.4 C) (Oral)   Resp 20   SpO2 98%   Physical Exam Vitals signs and nursing note reviewed.    68 year old male, resting comfortably and in no  acute distress. Vital signs are normal. Oxygen saturation is 98%, which is normal. Head is normocephalic and atraumatic. PERRLA, EOMI. Oropharynx is clear. Neck is nontender and supple without adenopathy or JVD. Back is mildly tender in the mid and lower lumbar area with moderate bilateral paralumbar spasm.  Straight leg raise is positive on the right at 15 degrees, positive crossed straight leg raise on the left at 30 degrees.  There is no CVA tenderness. Lungs are clear without rales, wheezes, or rhonchi. Chest is nontender. Heart has regular rate and rhythm without murmur. Abdomen is soft, flat, nontender without masses or hepatosplenomegaly and peristalsis is normoactive. Extremities have no cyanosis or edema, full range of motion is present. Skin is warm and dry without rash. Neurologic: Mental status is normal, cranial nerves are intact, there are no motor or sensory deficits.  Strength is 5/5 in all muscles of both legs.  No sensory deficit in either leg.  ED Treatments / Results   Procedures Procedures   Medications Ordered in ED Medications  ketorolac (TORADOL) 30 MG/ML injection 15 mg (has no administration in time range)  methocarbamol (ROBAXIN) 1,000 mg in dextrose 5 % 50 mL IVPB (has no administration in time range)  acetaminophen (TYLENOL) tablet 650 mg (has no administration in time range)     Initial Impression / Assessment and Plan / ED Course  I have reviewed the triage vital signs and the nursing notes.  Low back pain with right-sided sciatica.  Old records are reviewed confirming recent ED visit for back pain and neck pain.  At that time, MRI of lumbar and thoracic spine were significant for L4-5 bilateral subarticular recess narrowing that could affect L5 nerve root as well as L5-S1 right paracentral herniation with mild S1 mass effect.  CTA cervical spine was significant for degenerative changes and carotid atherosclerosis.  At this point, no red flags to suggest  anything more than ongoing chronic back and neck pain with sciatica.  I spent considerable amount of time trying to clarify what medications he is taking.  His medication list include methocarbamol and tramadol which she says he is taking, but I am not sure of that.  Review of his record on the [place] controlled substance reporting website shows no tramadol prescription since last July.  It has been 4 weeks since his ED visit and prescription for 15 oxycodone-acetaminophen tablets.  This is certainly not an excessive use of narcotics.  He will be given intravenous methocarbamol as well as ketorolac and oral acetaminophen and will be reassessed.  He feels much better after above-noted treatment.  He is discharged with prescriptions for oxycodone-acetaminophen, naproxen, methocarbamol.  Instructed to follow-up with his neurosurgeon.  Return precautions discussed.  Final Clinical Impressions(s) / ED Diagnoses   Final diagnoses:  Chronic midline low back pain with right-sided sciatica  Neck pain    ED Discharge Orders         Ordered    naproxen (NAPROSYN) 375 MG tablet  2 times daily     08/27/18 0801    oxyCODONE-acetaminophen (PERCOCET/ROXICET) 5-325 MG tablet  Every 6 hours PRN     08/27/18 0801    methocarbamol (ROBAXIN) 500 MG tablet  Every 6 hours PRN     08/27/18 0801           Dione BoozeGlick, Helma Argyle, MD 08/27/18 [phone]

## 2018-08-27 NOTE — ED Triage Notes (Addendum)
C/o posterior neck pain and lower back pain that radiates down R leg since noon yesterday.  Reports history of chronic back pain.  Seen at [facility] on 1/25 for lumbar disc herniation.  Pt states he fell approx 1 month ago.

## 2018-08-31 ENCOUNTER — Ambulatory Visit: Payer: Medicare Other | Admitting: Physical Therapy

## 2018-09-08 ENCOUNTER — Encounter: Payer: Self-pay | Admitting: Physical Therapy

## 2018-09-08 ENCOUNTER — Ambulatory Visit: Payer: Medicare Other | Attending: Physician Assistant | Admitting: Physical Therapy

## 2018-09-08 DIAGNOSIS — M5441 Lumbago with sciatica, right side: Secondary | ICD-10-CM | POA: Diagnosis not present

## 2018-09-08 DIAGNOSIS — M62830 Muscle spasm of back: Secondary | ICD-10-CM | POA: Insufficient documentation

## 2018-09-08 DIAGNOSIS — M5442 Lumbago with sciatica, left side: Secondary | ICD-10-CM | POA: Diagnosis not present

## 2018-09-08 DIAGNOSIS — M542 Cervicalgia: Secondary | ICD-10-CM | POA: Insufficient documentation

## 2018-09-08 NOTE — Therapy (Signed)
[facility] [address] [place], [place], [address] Phone: [phone]   Fax:  [phone]  Physical Therapy Evaluation  Patient Details  Name: Tyrone Mcdonald MRN: [id number] Date of Birth: Apr 07, 1951 Referring Provider (PT): Cindra Presume [place]   Encounter Date: 09/08/2018  PT End of Session - 09/08/18 1331    Visit Number  1    Number of Visits  4    Date for PT Re-Evaluation  10/09/18    Authorization Type  Medicaid    PT Start Time  1300    PT Stop Time  1400    PT Time Calculation (min)  60 min    Activity Tolerance  Patient limited by pain    Behavior During Therapy  [facility] for tasks assessed/performed       Past Medical History:  Diagnosis Date  . Chronic back pain   . Hypertension   . Sciatica     History reviewed. No pertinent surgical history.  There were no vitals filed for this visit.   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  MRI showed DDD, stenosis and possible impingment of the L5-S1 nerve root.    Limitations  Walking;House hold activities;Sitting    How long can you sit comfortably?  10    Diagnostic tests  MRI    Patient Stated Goals  have less pain    Currently in Pain?  Yes    Pain Score  8     Pain Location  Back   also c/o pain in the neck   Pain Orientation  Lower    Pain Descriptors / Indicators  Aching    Pain Type  Acute pain    Pain Radiating Towards  c/o in the hands and the legs at times    Pain Onset  More than a month ago    Pain Frequency  Constant    Aggravating Factors   sitting, lying down, lifting pain up to 10/10    Pain Relieving Factors  pain medication helps at best pain a 7/10    Effect of Pain on Daily Activities  limits everything, reports that he is tired of pain and tired all  over         [facility] PT Assessment - 09/08/18 0001      Assessment   Medical Diagnosis  back pain, neck pain    Referring Provider (PT)  Cindra Presume PA    Onset Date/Surgical Date  08/10/18    Prior Therapy  no      Precautions   Precautions  None      Balance Screen   Has the patient fallen in the past 6 months  No    Has the patient had a decrease in activity level because of a fear of falling?   No    Is the patient reluctant to leave their home because of a fear of falling?   No      Home Environment   Additional Comments  does most of the cooking and houwework due to wife has very poor eyesight      Prior Function   Level of Independence  Independent with community mobility with device    Vocation  On disability    Leisure  no exercise      Posture/Postural Control   Posture Comments  fwd head, rounded shoulders , slouched sitting      ROM / Strength   AROM / PROM / Strength  AROM;Strength      AROM   Overall AROM Comments  cervical ROM decreased 50% with pain in the neck, lumbar ROM decreased 75% with pain for all motions      Strength   Overall Strength Comments  3+/5 for UE's and LE's with minimal resistance given, c/o pain with the MMT      Palpation   Palpation comment  he is very tight and tender in the neck, upper traps, rhomboids and the lumbar area, seems tight      Ambulation/Gait   Gait Comments  at times uses a SPC, slow mild forward flexed trunk                Objective measurements completed on examination: See above findings.      [facility] Adult PT Treatment/Exercise - 09/08/18 0001      Modalities   Modalities  Electrical Stimulation;Moist Heat      Moist Heat Therapy   Number Minutes Moist Heat  15 Minutes    Moist Heat Location  Lumbar Spine      Electrical Stimulation   Electrical Stimulation Location  low to mid back    Electrical Stimulation Action  IFC    Electrical Stimulation Parameters  prone    Electrical  Stimulation Goals  Pain             PT Education - 09/08/18 1330    Education Details  Wms flexion    Person(s) Educated  Patient    Methods  Explanation;Demonstration;Handout    Comprehension  Verbalized understanding       PT Short Term Goals - 09/08/18 1336      PT SHORT TERM GOAL #1   Title  independent with initial HEP    Time  2    Period  Weeks    Status  New        PT Long Term Goals - 09/08/18 1336      PT LONG TERM GOAL #1   Title  increase ROM 25%    Time  12    Period  Weeks    Status  New      PT LONG TERM GOAL #2   Title  decrease pain 25%    Time  12    Period  Weeks    Status  New      PT LONG TERM GOAL #3   Title  increase strength to 4/5    Time  12    Period  Weeks    Status  New             Plan - 09/08/18 1333    Clinical Impression Statement  Patient reports that he has had low back and neck pain for over 2 years, his MRI showed some stenosis and disc bulge.  He reports pain an 8/10, reports medication helps but reports pain down to a 7/10.  HE is very limited in ROM, he is slow moving, could not give good resistance due to pain with MMT.    Personal Factors and Comorbidities  Finances;Fitness;Comorbidity 3+    Comorbidities  HTN, OA, CKD    Examination-Activity Limitations  Lift;Stand;Bed Mobility;Locomotion Level;Sit;Stairs    Examination-Participation Restrictions  [employer]    Stability/Clinical Decision Making  Evolving/Moderate  complexity    Clinical Decision Making  Moderate    Rehab Potential  Fair    PT Frequency  1x / week    PT Duration  12 weeks    PT Treatment/Interventions  ADLs/Self Care Home Management;Cryotherapy;Electrical Stimulation;Moist Heat;Traction;Ultrasound;Therapeutic exercise;Therapeutic activities;Functional mobility training;Balance training;Neuromuscular re-education;Patient/family education;Manual techniques;Dry needling    PT Next Visit Plan  try to start some exercises     Consulted and Agree with Plan of Care  Patient       Patient will benefit from skilled therapeutic intervention in order to improve the following deficits and impairments:  Abnormal gait, Improper body mechanics, Increased muscle spasms, Decreased activity tolerance, Decreased range of motion, Decreased strength, Difficulty walking, Impaired flexibility, Pain, Postural dysfunction  Visit Diagnosis: Acute bilateral low back pain with bilateral sciatica - Plan: PT plan of care cert/re-cert  Cervicalgia - Plan: PT plan of care cert/re-cert  Muscle spasm of back - Plan: PT plan of care cert/re-cert     Problem List Patient Active Problem List   Diagnosis Date Noted  . Osteoarthritis of both knees 05/23/2017  . CKD (chronic kidney disease) stage 3, GFR 30-59 ml/min (HCC) 05/23/2017  . Positive depression screening 05/23/2017  . Benign prostatic hyperplasia with urinary frequency 03/28/2017  . Lumbar radiculopathy 03/28/2017  . Chronic neck pain 03/28/2017  . Osteoarthritis of spine with radiculopathy, lumbar region 02/28/2016  . Language barrier affecting health care 02/28/2016  . Abnormal PSA 02/28/2016  . Essential hypertension 07/25/2014  . Family history of diabetes mellitus (DM) 07/25/2014  . Rhinitis, allergic 07/25/2014    Jearld LeschALBRIGHT, W., PT 09/08/2018, 1:39 PM  [facility] [address] [place], [place], [address] Phone: [phone]   Fax:  [phone]  Name: Tyrone Mcdonald MRN: [id number] Date of Birth: 11/13/1950

## 2018-09-15 ENCOUNTER — Ambulatory Visit: Payer: Medicare Other | Admitting: Physical Therapy

## 2018-09-15 DIAGNOSIS — M5442 Lumbago with sciatica, left side: Principal | ICD-10-CM

## 2018-09-15 DIAGNOSIS — M62830 Muscle spasm of back: Secondary | ICD-10-CM | POA: Diagnosis not present

## 2018-09-15 DIAGNOSIS — M542 Cervicalgia: Secondary | ICD-10-CM | POA: Diagnosis not present

## 2018-09-15 DIAGNOSIS — M5441 Lumbago with sciatica, right side: Secondary | ICD-10-CM | POA: Diagnosis not present

## 2018-09-15 NOTE — Patient Instructions (Signed)
  Flexibility: Upper Trapezius Stretch   Gently grasp right side of head while reaching behind back with other hand. Tilt head away until a gentle stretch is felt. Hold 30 seconds. Repeat 3 times per set. Do 2 sessions per day.  http://orth.exer.us/340   Levator Stretch   Grasp seat or sit on hand on side to be stretched. Turn head toward other side and look down. Use hand on head to gently stretch neck in that position. Hold _30___ seconds. Repeat on other side. Repeat 3 times. Do 2 sessions per day.  http://gt2.exer.us/30   Scapular Retraction (Standing)   With arms at sides, pinch shoulder blades together. Repeat 10 times per set. Do 1-3 sets per session. Do 2 sessions per day.  http://orth.exer.us/944     Flexibility: Neck Retraction   Pull head straight back, keeping eyes and jaw level. Hold 3-5 seconds. Repeat _10 times per set. Do 3-5  sessions per day.  http://orth.exer.us/344   Posture - Sitting   Sit upright, head facing forward. Try using a roll to support lower back. Keep shoulders relaxed, and avoid rounded back. Keep hips level with knees. Avoid crossing legs for long periods.  Bridge    Lie back, legs bent. Pressing hips up. Hold 2-3 seconds and release back down.  Repeat _20___ times. Do _2___ sessions per day.  http://pm.exer.us/54   Copyright  VHI. All rights reserved.   Solon Palm, PT 09/15/18 8:40 AM [facility] [address] [place], [place], [address] Phone: [phone]   Fax:  [phone]

## 2018-09-15 NOTE — Therapy (Signed)
[place], [place], [address] Phone: [phone]   Fax:  [phone]  Physical Therapy Treatment  Patient Details  Name: Tyrone Mcdonald MRN: [id number] Date of Birth: 05-Feb-1951 Referring Provider (PT): Ferne Reus [place]   Encounter Date: 09/15/2018  PT End of Session - 09/15/18 0804    Visit Number  2    Number of Visits  4    Date for PT Re-Evaluation  10/09/18    Authorization Type  Medicaid    PT Start Time  0800    PT Stop Time  0852    PT Time Calculation (min)  52 min    Activity Tolerance  Patient limited by pain    Behavior During Therapy  [facility] for tasks assessed/performed       Past Medical History:  Diagnosis Date  . Chronic back pain   . Hypertension   . Sciatica     No past surgical history on file.  There were no vitals filed for this visit.  Subjective Assessment - 09/15/18 0804    Subjective  Patient reports that his back feels better but his neck is really hurting    Patient is accompained by:  Interpreter    How long can you sit comfortably?  10    Diagnostic tests  MRI    Patient Stated Goals  have less pain    Currently in Pain?  Yes    Pain Score  4     Pain Location  Back    Pain Orientation  Lower    Pain Descriptors / Indicators  Aching    Pain Type  Acute pain    Multiple Pain Sites  Yes    Pain Score  8    Pain Location  Neck    Pain Orientation  Lower    Pain Descriptors / Indicators  Aching    Pain Type  Acute pain                       [facility] Adult PT Treatment/Exercise - 09/15/18 0001      Exercises   Exercises  Neck;Lumbar      Neck Exercises: Machines for Strengthening   Nustep  L4 x 5 min    Cybex Row  15# x 10   painful after 10   Lat Pull  15# x 20      Neck Exercises: Seated   Neck Retraction  10 reps    Other Seated Exercise  scap retraction x 10      Neck Exercises: Supine   Neck Retraction  10  reps      Lumbar Exercises: Stretches   Single Knee to Chest Stretch  Right;Left;2 reps;20 seconds    Lower Trunk Rotation  5 reps;10 seconds      Lumbar Exercises: Supine   Bridge  Non-compliant;20 reps    Straight Leg Raise  10 reps   cues to keep abdominals tight     Modalities   Modalities  Electrical Stimulation;Moist Heat      Moist Heat Therapy   Number Minutes Moist Heat  15 Minutes    Moist Heat Location  Lumbar Spine;Cervical      Electrical Stimulation   Electrical Stimulation Location  cervical and upper lumbar    Electrical Stimulation Action  premod    Electrical Stimulation Parameters  supine    Electrical Stimulation Goals  Pain  Neck Exercises: Stretches   Upper Trapezius Stretch  Right;Left;2 reps;30 seconds    Levator Stretch  Right;Left;2 reps;30 seconds             PT Education - 09/15/18 0842    Education Details  HEP    Person(s) Educated  Patient    Methods  Explanation;Demonstration;Handout    Comprehension  Verbalized understanding;Returned demonstration       PT Short Term Goals - 09/15/18 0855      PT SHORT TERM GOAL #1   Title  independent with initial HEP    Time  2    Period  Weeks    Status  Partially Met        PT Long Term Goals - 09/08/18 1336      PT LONG TERM GOAL #1   Title  increase ROM 25%    Time  12    Period  Weeks    Status  New      PT LONG TERM GOAL #2   Title  decrease pain 25%    Time  12    Period  Weeks    Status  New      PT LONG TERM GOAL #3   Title  increase strength to 4/5    Time  12    Period  Weeks    Status  New            Plan - 09/15/18 0841    Clinical Impression Statement  Patient reported improvement in his low back today but stated his neck was really hurting. He did with therex, except he felt some pain with cybex rows and scapular retraction.     PT Treatment/Interventions  ADLs/Self Care Home Management;Cryotherapy;Electrical Stimulation;Moist  Heat;Traction;Ultrasound;Therapeutic exercise;Therapeutic activities;Functional mobility training;Balance training;Neuromuscular re-education;Patient/family education;Manual techniques;Dry needling    PT Next Visit Plan  continue with strengtheing.       Patient will benefit from skilled therapeutic intervention in order to improve the following deficits and impairments:  Abnormal gait, Improper body mechanics, Increased muscle spasms, Decreased activity tolerance, Decreased range of motion, Decreased strength, Difficulty walking, Impaired flexibility, Pain, Postural dysfunction  Visit Diagnosis: Acute bilateral low back pain with bilateral sciatica  Cervicalgia  Muscle spasm of back     Problem List Patient Active Problem List   Diagnosis Date Noted  . Osteoarthritis of both knees 05/23/2017  . CKD (chronic kidney disease) stage 3, GFR 30-59 ml/min (HCC) 05/23/2017  . Positive depression screening 05/23/2017  . Benign prostatic hyperplasia with urinary frequency 03/28/2017  . Lumbar radiculopathy 03/28/2017  . Chronic neck pain 03/28/2017  . Osteoarthritis of spine with radiculopathy, lumbar region 02/28/2016  . Language barrier affecting health care 02/28/2016  . Abnormal PSA 02/28/2016  . Essential hypertension 07/25/2014  . Family history of diabetes mellitus (DM) 07/25/2014  . Rhinitis, allergic 07/25/2014    Madelyn Flavors PT 09/15/2018, 8:56 AM  [place], [place], [address] Phone: [phone]   Fax:  [phone]  Name: Tyrone Mcdonald MRN: [id number] Date of Birth: 1951/04/03

## 2018-09-22 ENCOUNTER — Ambulatory Visit: Payer: Medicare Other

## 2018-09-22 ENCOUNTER — Ambulatory Visit: Payer: Medicare Other | Admitting: Physical Therapy

## 2018-09-22 ENCOUNTER — Other Ambulatory Visit: Payer: Self-pay

## 2018-09-22 DIAGNOSIS — M5441 Lumbago with sciatica, right side: Secondary | ICD-10-CM | POA: Diagnosis not present

## 2018-09-22 DIAGNOSIS — M62830 Muscle spasm of back: Secondary | ICD-10-CM | POA: Diagnosis not present

## 2018-09-22 DIAGNOSIS — M5442 Lumbago with sciatica, left side: Principal | ICD-10-CM

## 2018-09-22 DIAGNOSIS — M542 Cervicalgia: Secondary | ICD-10-CM

## 2018-09-22 NOTE — Therapy (Signed)
[facility] [address] [place], [place], [address] Phone: [phone]   Fax:  [phone]  Physical Therapy Treatment  Patient Details  Name: Tyrone Mcdonald MRN: [id number] Date of Birth: 05/01/1951 Referring Provider (PT): Vincent Costella PA   Encounter Date: 09/22/2018  PT End of Session - 09/22/18 1428    Visit Number  3    Number of Visits  4    Date for PT Re-Evaluation  10/09/18    Authorization Type  Medicaid    PT Start Time  1428    PT Stop Time  1515    PT Time Calculation (min)  47 min    Activity Tolerance  Patient limited by pain    Behavior During Therapy  WFL for tasks assessed/performed       Past Medical History:  Diagnosis Date  . Chronic back pain   . Hypertension   . Sciatica     No past surgical history on file.  There were no vitals filed for this visit.  Subjective Assessment - 09/22/18 1429    Subjective  Patient states his neck and back are really hurting. When he leaves PT he feels a little better but by the next morning his pain is back. Same with medication. It lasts about 2 hours and then pain returns.  He states that no position helps the pain either.     Limitations  Walking;House hold activities;Sitting    How long can you sit comfortably?  10    Diagnostic tests  MRI    Patient Stated Goals  have less pain    Currently in Pain?  Yes    Pain Score  10-Worst pain ever    Pain Location  Back    Pain Orientation  Lower    Pain Descriptors / Indicators  Aching    Pain Type  Acute pain    Pain Onset  More than a month ago                       [facility] Adult PT Treatment/Exercise - 09/22/18 0001      Self-Care   Self-Care  Other Self-Care Comments    Other Self-Care Comments   MFR with ball and foam roller      Neck Exercises: Machines for Strengthening   Nustep  L3 x 5 min      Neck Exercises: Standing   Neck Retraction  10 reps      Lumbar Exercises: Standing   Other Standing Lumbar Exercises  standing ext 2x5    Other Standing Lumbar Exercises  traction at sink x 1 min      Lumbar Exercises: Sidelying   Other Sidelying Lumbar Exercises  over bolster abolishes LLE pain      Manual Therapy   Manual Therapy  Soft tissue mobilization;Myofascial release;Joint mobilization    Joint Mobilization  PA mobs gd III to lumbar spine    Soft tissue mobilization  to bil UT, levator scap, rhomboids; thoracic and lumbar paraspinals   in prone   Myofascial Release  TPR to bil QL               PT Short Term Goals - 09/15/18 0855      PT SHORT TERM GOAL #1   Title  independent with initial HEP    Time  2    Period  Weeks    Status  Partially Met          PT Long Term Goals - 09/08/18 1336      PT LONG TERM GOAL #1   Title  increase ROM 25%    Time  12    Period  Weeks    Status  New      PT LONG TERM GOAL #2   Title  decrease pain 25%    Time  12    Period  Weeks    Status  New      PT LONG TERM GOAL #3   Title  increase strength to 4/5    Time  12    Period  Weeks    Status  New            Plan - 09/22/18 1523    Clinical Impression Statement  Patient presents with increased pain today in neck and back. He responded very well to deep STW and MFR in the neck and back. He left the clinic with at least 50% less pain and improved movement.     PT Treatment/Interventions  ADLs/Self Care Home Management;Cryotherapy;Electrical Stimulation;Moist Heat;Traction;Ultrasound;Therapeutic exercise;Therapeutic activities;Functional mobility training;Balance training;Neuromuscular re-education;Patient/family education;Manual techniques;Dry needling    PT Next Visit Plan  continue with STW/stretching; assess goals; D/C or request more visits.        Patient will benefit from skilled therapeutic intervention in order to improve the following deficits and impairments:  Abnormal gait, Improper body mechanics,  Increased muscle spasms, Decreased activity tolerance, Decreased range of motion, Decreased strength, Difficulty walking, Impaired flexibility, Pain, Postural dysfunction  Visit Diagnosis: Acute bilateral low back pain with bilateral sciatica  Cervicalgia  Muscle spasm of back     Problem List Patient Active Problem List   Diagnosis Date Noted  . Osteoarthritis of both knees 05/23/2017  . CKD (chronic kidney disease) stage 3, GFR 30-59 ml/min (HCC) 05/23/2017  . Positive depression screening 05/23/2017  . Benign prostatic hyperplasia with urinary frequency 03/28/2017  . Lumbar radiculopathy 03/28/2017  . Chronic neck pain 03/28/2017  . Osteoarthritis of spine with radiculopathy, lumbar region 02/28/2016  . Language barrier affecting health care 02/28/2016  . Abnormal PSA 02/28/2016  . Essential hypertension 07/25/2014  . Family history of diabetes mellitus (DM) 07/25/2014  . Rhinitis, allergic 07/25/2014      PT 09/22/2018, 4:18 PM  [facility] [address] [place], [place], [address] Phone: [phone]   Fax:  [phone]  Name: Stacy Eltom Elmahadi Kines MRN: [id number] Date of Birth: 01/14/1951   

## 2018-09-22 NOTE — Patient Instructions (Signed)
Backward Bend (Standing)   Arch backward to make hollow of back deeper. Hold _2-3___ seconds. Repeat __10-20__ times per set. Do ____ sets per session. Do __3-4__ sessions per day.    Solon Palm, PT 09/22/18 3:14 PM; [facility] [address] [place], [place], [address] Phone: [phone]   Fax:  [phone]

## 2018-09-29 ENCOUNTER — Ambulatory Visit: Payer: Medicare Other

## 2018-10-09 ENCOUNTER — Ambulatory Visit: Payer: Medicaid Other | Admitting: Internal Medicine

## 2018-10-14 ENCOUNTER — Other Ambulatory Visit: Payer: Self-pay

## 2018-10-14 ENCOUNTER — Encounter: Payer: Self-pay | Admitting: Physical Therapy

## 2018-10-14 ENCOUNTER — Ambulatory Visit: Payer: Medicare Other | Attending: Physician Assistant | Admitting: Physical Therapy

## 2018-10-14 DIAGNOSIS — M5442 Lumbago with sciatica, left side: Secondary | ICD-10-CM | POA: Diagnosis not present

## 2018-10-14 DIAGNOSIS — M62830 Muscle spasm of back: Secondary | ICD-10-CM

## 2018-10-14 DIAGNOSIS — M542 Cervicalgia: Secondary | ICD-10-CM | POA: Diagnosis not present

## 2018-10-14 DIAGNOSIS — M5441 Lumbago with sciatica, right side: Secondary | ICD-10-CM

## 2018-10-14 NOTE — Therapy (Signed)
[place], [place], [address] Phone: [phone]   Fax:  [phone]  Physical Therapy Treatment  Patient Details  Name: Tyrone Mcdonald MRN: [id number] Date of Birth: 07-Feb-1951 Referring Provider (PT): Ferne Reus [place]   Encounter Date: 10/14/2018  PT End of Session - 10/14/18 1457    Visit Number  4    Number of Visits  4    Date for PT Re-Evaluation  10/09/18    Authorization Type  Medicaid    PT Start Time  1414    PT Stop Time  1455    PT Time Calculation (min)  41 min    Activity Tolerance  Patient tolerated treatment well    Behavior During Therapy  [facility] for tasks assessed/performed       Past Medical History:  Diagnosis Date  . Chronic back pain   . Hypertension   . Sciatica     History reviewed. No pertinent surgical history.  There were no vitals filed for this visit.  Subjective Assessment - 10/14/18 1415    Subjective  "Better" "Some pain in my neck"    Currently in Pain?  Yes    Pain Score  5     Pain Location  Neck         [facility] PT Assessment - 10/14/18 0001      AROM   Overall AROM   Within functional limits for tasks performed    Overall AROM Comments  some LBP with flexion    AROM Assessment Site  Lumbar;Cervical                   [facility] Adult PT Treatment/Exercise - 10/14/18 0001      Neck Exercises: Machines for Strengthening   Nustep  L3 x 5 min      Lumbar Exercises: Stretches   Active Hamstring Stretch  Right;Left;4 reps;10 seconds    Passive Hamstring Stretch  Right;Left;3 reps;10 seconds    Single Knee to Chest Stretch  Right;Left;3 reps;10 seconds      Lumbar Exercises: Machines for Strengthening   Cybex Knee Extension  5lb x10    Cybex Knee Flexion  20lb x10       Lumbar Exercises: Standing   Row  Theraband;20 reps;Both    Theraband Level (Row)  Level 2 (Red)    Shoulder Extension  Theraband;20  reps;Both;Strengthening    Theraband Level (Shoulder Extension)  Level 2 (Red)    Other Standing Lumbar Exercises  Shoulder ER red 2x10     Other Standing Lumbar Exercises  wall push ups 2x10      Manual Therapy   Manual Therapy  Soft tissue mobilization;Passive ROM;Manual Traction    Soft tissue mobilization  posterior cervical paraspinales into upper traps    Passive ROM  cervical spine    Manual Traction  cervical spine 5X10 sec               PT Short Term Goals - 09/15/18 0855      PT SHORT TERM GOAL #1   Title  independent with initial HEP    Time  2    Period  Weeks    Status  Partially Met        PT Long Term Goals - 09/08/18 1336      PT LONG TERM GOAL #1   Title  increase ROM 25%    Time  12  Period  Weeks    Status  New      PT LONG TERM GOAL #2   Title  decrease pain 25%    Time  12    Period  Weeks    Status  New      PT LONG TERM GOAL #3   Title  increase strength to 4/5    Time  12    Period  Weeks    Status  New            Plan - 10/14/18 1503    Clinical Impression Statement  Pt reports that he feels better, pt tolerated a more progressive treatment session well. Pt has improved increasing his lumbar and cervical ROM. He did report some posterior knee pain with seated HS curls, this pain was still their with leg extensions. Significant tightness noted with passive HS stretch. Posterior L knee pain did go away with stretching.     Personal Factors and Comorbidities  Finances;Fitness;Comorbidity 3+    Comorbidities  HTN, OA, CKD    Examination-Activity Limitations  Lift;Stand;Bed Mobility;Locomotion Level;Sit;Stairs    Examination-Participation Restrictions  Yard Work;Cleaning;Laundry    Stability/Clinical Decision Making  Evolving/Moderate complexity    PT Frequency  1x / week    PT Duration  12 weeks    PT Treatment/Interventions  ADLs/Self Care Home Management;Cryotherapy;Electrical Stimulation;Moist  Heat;Traction;Ultrasound;Therapeutic exercise;Therapeutic activities;Functional mobility training;Balance training;Neuromuscular re-education;Patient/family education;Manual techniques;Dry needling    PT Home Exercise Plan  HS stretch with belt       Patient will benefit from skilled therapeutic intervention in order to improve the following deficits and impairments:     Visit Diagnosis: Acute bilateral low back pain with bilateral sciatica  Cervicalgia  Muscle spasm of back     Problem List Patient Active Problem List   Diagnosis Date Noted  . Osteoarthritis of both knees 05/23/2017  . CKD (chronic kidney disease) stage 3, GFR 30-59 ml/min (HCC) 05/23/2017  . Positive depression screening 05/23/2017  . Benign prostatic hyperplasia with urinary frequency 03/28/2017  . Lumbar radiculopathy 03/28/2017  . Chronic neck pain 03/28/2017  . Osteoarthritis of spine with radiculopathy, lumbar region 02/28/2016  . Language barrier affecting health care 02/28/2016  . Abnormal PSA 02/28/2016  . Essential hypertension 07/25/2014  . Family history of diabetes mellitus (DM) 07/25/2014  . Rhinitis, allergic 07/25/2014    Scot Jun, PTA 10/14/2018, 3:09 PM  [place], [place], [address] Phone: [phone]   Fax:  [phone]  Name: Tyrone Mcdonald MRN: [id number] Date of Birth: [birth year]

## 2018-10-19 ENCOUNTER — Ambulatory Visit: Payer: Medicare Other | Attending: Internal Medicine | Admitting: Internal Medicine

## 2018-10-19 DIAGNOSIS — K0889 Other specified disorders of teeth and supporting structures: Secondary | ICD-10-CM | POA: Diagnosis not present

## 2018-10-19 DIAGNOSIS — J302 Other seasonal allergic rhinitis: Secondary | ICD-10-CM | POA: Diagnosis not present

## 2018-10-19 DIAGNOSIS — G8929 Other chronic pain: Secondary | ICD-10-CM

## 2018-10-19 DIAGNOSIS — M542 Cervicalgia: Secondary | ICD-10-CM

## 2018-10-19 DIAGNOSIS — K029 Dental caries, unspecified: Secondary | ICD-10-CM | POA: Diagnosis not present

## 2018-10-19 DIAGNOSIS — M5416 Radiculopathy, lumbar region: Secondary | ICD-10-CM

## 2018-10-19 MED ORDER — FLUTICASONE PROPIONATE 50 MCG/ACT NA SUSP: 2.0000 | Freq: Every day | NASAL | 2 refills | Status: DC

## 2018-10-19 MED ORDER — LORATADINE 10 MG PO TABS: 10.0000 mg | ORAL_TABLET | Freq: Every day | ORAL | 2 refills | Status: DC

## 2018-10-19 MED ORDER — TRAMADOL HCL 50 MG PO TABS: 50.0000 mg | ORAL_TABLET | Freq: Three times a day (TID) | ORAL | 0 refills | Status: DC | PRN

## 2018-10-19 NOTE — Progress Notes (Signed)
Virtual Visit via Telephone Note  I connected with Tyrone Mcdonald on 10/19/18 at 11:26 a.m by telephone from my office and verified that I am speaking with the correct person using two identifiers. Interpreter from [employer] on line [phone]   I discussed the limitations, risks, security and privacy concerns of performing an evaluation and management service by telephone and the availability of in person appointments. I also discussed with the patient that there may be a patient responsible charge related to this service. The patient expressed understanding and agreed to proceed.   History of Present Illness: Patient with history ofHTN,BPH,lumbar radiculopathy, OA BL knees.  I last saw him 02/2018.  1.  pt c/o chronic pain in lower back and neck.  He would like a referral for more P.T which he was getting last mth.  Endorses pain across lower back that radiates down both legs LT>RT. No numbness.  MRI of lumbar spine done  08/01/18: IMPRESSION: 1. No acute finding. 2. Remote L1 superior endplate fracture. 3. Disc and facet degeneration as described. 4. L3-4 moderate left foraminal stenosis. 5. L4-5 bilateral subarticular recess narrowing that could affect either L5 nerve root. 6. L5-S1 right paracentral herniation with mild S1 mass-effect.  Pain in neck does not radiate.  No numbness or tingling. IMPRESSION: 1. No acute findings. No fracture or acute subluxation within the cervical spine. 2. Degenerative spondylosis throughout the cervical spine, most prominent within the lower cervical spine, as detailed above. 3. Carotid atherosclerosis.  Pain better with P.T but he would like to get some more sessions.  Request RF on Tramadol which I prescribed for him 02/2018.  He found it helpful  2.  C/o allergy symptoms: burning in nostrils, nasal congestion, lacrimation and itchy eyes.  No sneezing. Not taking anything for it.  3.  C/o pain in one of his RT upper molar  x 3 days. No swelling in jaw.  Tooth is decayed  4.  HTN:  Reports compliance with BP Norvasc.  No device to check BP.   Observations/Objective: No direct observation done.   Assessment and Plan: 1. Lumbar radiculopathy I have resubmitted referral for physical therapy for his neck and back.  I have done a refill on tramadol for him to use as needed.  We did a controlled substance prescribing agreement with him in August 2019. - traMADol (ULTRAM) 50 MG tablet; Take 1 tablet (50 mg total) by mouth every 8 (eight) hours as needed.  Dispense: 90 tablet; Refill: 0  2. Chronic neck pain See #1 above - traMADol (ULTRAM) 50 MG tablet; Take 1 tablet (50 mg total) by mouth every 8 (eight) hours as needed.  Dispense: 90 tablet; Refill: 0  3. Seasonal allergies - fluticasone (FLONASE) 50 MCG/ACT nasal spray; Place 2 sprays into both nostrils daily.  Dispense: 16 g; Refill: 2 - loratadine (CLARITIN) 10 MG tablet; Take 1 tablet (10 mg total) by mouth daily.  Dispense: 30 tablet; Refill: 2  4. Tooth pain 5. Dental cavity Dental referral submitted   Follow Up Instructions: F/u in 3 mths   I discussed the assessment and treatment plan with the patient. The patient was provided an opportunity to ask questions and all were answered. The patient agreed with the plan and demonstrated an understanding of the instructions.   The patient was advised to call back or seek an in-person evaluation if the symptoms worsen or if the condition fails to improve as anticipated.  I provided 22 minutes of non-face-to-face  time during this encounter.   Karle Plumber, MD

## 2018-10-19 NOTE — Progress Notes (Signed)
Pt states he is having a burning pain in his mouth  Pt states he has hernia disc and he is needing medicine

## 2018-10-20 ENCOUNTER — Emergency Department (HOSPITAL_COMMUNITY)
Admission: EM | Admit: 2018-10-20 | Discharge: 2018-10-20 | Disposition: A | Discharge status: Home / Self Care | Payer: Medicare Other | Attending: Emergency Medicine | Admitting: Emergency Medicine

## 2018-10-20 ENCOUNTER — Other Ambulatory Visit: Payer: Self-pay

## 2018-10-20 ENCOUNTER — Encounter (HOSPITAL_COMMUNITY): Payer: Self-pay | Admitting: Emergency Medicine

## 2018-10-20 DIAGNOSIS — N183 Chronic kidney disease, stage 3 (moderate): Secondary | ICD-10-CM | POA: Diagnosis not present

## 2018-10-20 DIAGNOSIS — R000 Tachycardia, unspecified: Secondary | ICD-10-CM | POA: Diagnosis not present

## 2018-10-20 DIAGNOSIS — I129 Hypertensive chronic kidney disease with stage 1 through stage 4 chronic kidney disease, or unspecified chronic kidney disease: Secondary | ICD-10-CM | POA: Diagnosis not present

## 2018-10-20 DIAGNOSIS — J302 Other seasonal allergic rhinitis: Secondary | ICD-10-CM | POA: Diagnosis not present

## 2018-10-20 DIAGNOSIS — R0981 Nasal congestion: Secondary | ICD-10-CM | POA: Insufficient documentation

## 2018-10-20 DIAGNOSIS — K0889 Other specified disorders of teeth and supporting structures: Secondary | ICD-10-CM | POA: Insufficient documentation

## 2018-10-20 DIAGNOSIS — Z79899 Other long term (current) drug therapy: Secondary | ICD-10-CM | POA: Diagnosis not present

## 2018-10-20 MED ORDER — HYDROCODONE-ACETAMINOPHEN 5-325 MG PO TABS
1.0000 | ORAL_TABLET | Freq: Once | ORAL | Status: AC
  Administered 2018-10-20: 1 via ORAL
  Filled 2018-10-20: qty 1

## 2018-10-20 MED ORDER — SALINE SPRAY 0.65 % NA SOLN
1.0000 | Freq: Once | NASAL | Status: AC
  Administered 2018-10-20: 1 via NASAL
  Filled 2018-10-20: qty 44

## 2018-10-20 MED ORDER — FLUTICASONE PROPIONATE 50 MCG/ACT NA SUSP
2.0000 | Freq: Every day | NASAL | Status: DC
  Administered 2018-10-20: 2 via NASAL
  Filled 2018-10-20: qty 16

## 2018-10-20 NOTE — ED Provider Notes (Signed)
[facility] EMERGENCY DEPARTMENT Provider Note   CSN: [id number] Arrival date & time: 10/20/18  0037    History   Chief Complaint Chief Complaint  Patient presents with  . Allergies  . Dental Pain    HPI Tyrone Mcdonald is a 68 y.o. male.     HPI  This is a 68 year old male with a history of chronic back pain and hypertension who presents with sneezing, runny nose, burning in his nose, and dental pain.  Patient reports he has had symptoms for 2 to 3 days.  He rates his pain at 10 out of 10.  He states that he thinks that this is his allergies.  He did see his primary doctor by telemedicine today.  They prescribe some medications for which she has not taken.  Patient describes runny nose, burning in his nose.  Additionally he reports facial congestion and dental pain.  Denies cough, fever, shortness of breath, sore throat.  Denies chest pain, abdominal pain, nausea, vomiting, diarrhea.  Past Medical History:  Diagnosis Date  . Chronic back pain   . Hypertension   . Sciatica     Patient Active Problem List   Diagnosis Date Noted  . Osteoarthritis of both knees 05/23/2017  . CKD (chronic kidney disease) stage 3, GFR 30-59 ml/min (HCC) 05/23/2017  . Positive depression screening 05/23/2017  . Benign prostatic hyperplasia with urinary frequency 03/28/2017  . Lumbar radiculopathy 03/28/2017  . Chronic neck pain 03/28/2017  . Osteoarthritis of spine with radiculopathy, lumbar region 02/28/2016  . Language barrier affecting health care 02/28/2016  . Abnormal PSA 02/28/2016  . Essential hypertension 07/25/2014  . Family history of diabetes mellitus (DM) 07/25/2014  . Rhinitis, allergic 07/25/2014    History reviewed. No pertinent surgical history.      Home Medications    Prior to Admission medications   Medication Sig Start Date End Date Taking? Authorizing Provider  albuterol (PROVENTIL HFA;VENTOLIN HFA) 108 (90 Base) MCG/ACT inhaler  Inhale 1-2 puffs into the lungs every 6 (six) hours as needed for wheezing or shortness of breath. 06/28/18   Elpidio Anis, PA-C  amLODipine (NORVASC) 10 MG tablet Take 1 tablet (10 mg total) by mouth daily. 07/09/18   Anders Simmonds, PA-C  fluticasone (FLONASE) 50 MCG/ACT nasal spray Place 2 sprays into both nostrils daily. 10/19/18   Marcine Matar, MD  ibuprofen (ADVIL,MOTRIN) 200 MG tablet Take 600 mg by mouth every 6 (six) hours as needed for mild pain.    [provider]  loratadine (CLARITIN) 10 MG tablet Take 1 tablet (10 mg total) by mouth daily. 10/19/18   Marcine Matar, MD  traMADol (ULTRAM) 50 MG tablet Take 1 tablet (50 mg total) by mouth every 8 (eight) hours as needed. 10/19/18   Marcine Matar, MD    Family History Family History  Problem Relation Age of Onset  . Hyperlipidemia Father   . Cancer Brother   . Diabetes Paternal Uncle     Social History Social History   Tobacco Use  . Smoking status: Never Smoker  . Smokeless tobacco: Never Used  Substance Use Topics  . Alcohol use: No    Alcohol/week: 0.0 standard drinks  . Drug use: No     Allergies   Patient has no known allergies.   Review of Systems Review of Systems  Constitutional: Negative for fever.  HENT: Positive for dental problem, sinus pressure and sneezing. Negative for sore throat.   Respiratory: Negative  for cough and shortness of breath.   Cardiovascular: Negative for chest pain.  Gastrointestinal: Negative for abdominal pain, nausea and vomiting.  Genitourinary: Negative for dysuria.  All other systems reviewed and are negative.    Physical Exam Updated Vital Signs BP (!) 116/111 (BP Location: Right Arm)   Pulse (!) 120   Temp 99.7 F (37.6 C) (Oral)   Resp 19   SpO2 100%   Physical Exam Vitals signs and nursing note reviewed.  Constitutional:      Appearance: He is well-developed.  HENT:     Head: Normocephalic and atraumatic.     Comments: Tenderness  palpation over the bilateral frontal sinuses    Nose: Rhinorrhea present.     Comments: Swollen turbinates bilaterally    Mouth/Throat:     Mouth: Mucous membranes are moist.     Comments: Poor dentition, no obvious abscess, tenderness to palpation over the right upper gumline Eyes:     Pupils: Pupils are equal, round, and reactive to light.  Neck:     Musculoskeletal: Normal range of motion and neck supple.  Cardiovascular:     Rate and Rhythm: Regular rhythm.     Comments: Tachycardia Pulmonary:     Effort: Pulmonary effort is normal. No respiratory distress.  Abdominal:     Palpations: Abdomen is soft.     Tenderness: There is no abdominal tenderness.  Musculoskeletal:        General: No swelling.  Skin:    General: Skin is warm and dry.  Neurological:     Mental Status: He is alert and oriented to person, place, and time.  Psychiatric:        Mood and Affect: Mood normal.      ED Treatments / Results  Labs (all labs ordered are listed, but only abnormal results are displayed) Labs Reviewed - No data to display  EKG EKG Interpretation  Date/Time:  Tuesday October 20 2018 00:51:49 EDT Ventricular Rate:  118 PR Interval:    QRS Duration: 76 QT Interval:  320 QTC Calculation: 449 R Axis:   33 Text Interpretation:  Sinus tachycardia Right atrial enlargement Abnormal R-wave progression, early transition Borderline repolarization abnormality Confirmed by Ross MarcusHorton, Leonarda Leis ([id number]) on 10/20/2018 2:05:14 AM   Radiology No results found.  Procedures Procedures (including critical care time)  Medications Ordered in ED Medications  fluticasone (FLONASE) 50 MCG/ACT nasal spray 2 spray (2 sprays Each Nare Given 10/20/18 0134)  HYDROcodone-acetaminophen (NORCO/VICODIN) 5-325 MG per tablet 1 tablet (has no administration in time range)  sodium chloride (OCEAN) 0.65 % nasal spray 1 spray (1 spray Each Nare Given 10/20/18 0134)  HYDROcodone-acetaminophen (NORCO/VICODIN) 5-325 MG  per tablet 1 tablet (1 tablet Oral Given 10/20/18 0134)     Initial Impression / Assessment and Plan / ED Course  I have reviewed the triage vital signs and the nursing notes.  Pertinent labs & imaging results that were available during my care of the patient were reviewed by me and considered in my medical decision making (see chart for details).        Patient presents with rhinorrhea, nasal discomfort, dental pain.  He is overall nontoxic.  He is notably tachycardic to 120 but endorses pain.  He denies other symptoms.  He is afebrile.  On exam he has swollen nasal turbinates and tenderness to the bilateral frontal sinuses.  Suspect he may have an element of sinusitis as well.  He was given Flonase and nasal saline.  No obvious dental abscess.  Dental pain may be related to sinus pressure.   2:12 AM   On recheck, heart rate is now 94.  Patient states he feels much improved.  Will discharge home with Flonase and nasal saline.  Follow primary physician.  After history, exam, and medical workup I feel the patient has been appropriately medically screened and is safe for discharge home. Pertinent diagnoses were discussed with the patient. Patient was given return precautions.   Final Clinical Impressions(s) / ED Diagnoses   Final diagnoses:  Seasonal allergies  Sinus congestion  Pain, dental    ED Discharge Orders    None       Shon Baton, MD 10/20/18 [phone]

## 2018-10-20 NOTE — ED Triage Notes (Signed)
Pt reports problem with allergies, reports runny nose, burning to nose, dental pain. Pt denies cough, fever, SHOB, sore throat.

## 2018-10-20 NOTE — Discharge Instructions (Addendum)
You were seen today for allergies and dental pain.  You may also have a component of a sinus infection.  Use the nasal saline as needed during the day.  You may use Flonase daily.  Follow-up with your primary physician.

## 2018-10-20 NOTE — ED Notes (Signed)
ED Provider at bedside. 

## 2018-10-21 ENCOUNTER — Telehealth: Payer: Self-pay

## 2018-10-21 NOTE — Telephone Encounter (Signed)
Contacted [facility] on [address]  to call in Tramadol 50MG  Rx. Spoke to [place] Pharmacist   Name: Tramadol  Strength: 50MG  Sig: Take 1 tablet by mouth every 8 hours prn Quantity: 90 Refills: 0

## 2018-10-22 ENCOUNTER — Other Ambulatory Visit: Payer: Self-pay

## 2018-10-22 ENCOUNTER — Ambulatory Visit: Payer: Medicare Other | Admitting: Physical Therapy

## 2018-10-22 ENCOUNTER — Encounter: Payer: Self-pay | Admitting: Physical Therapy

## 2018-10-22 DIAGNOSIS — M62830 Muscle spasm of back: Secondary | ICD-10-CM

## 2018-10-22 DIAGNOSIS — M542 Cervicalgia: Secondary | ICD-10-CM | POA: Diagnosis not present

## 2018-10-22 DIAGNOSIS — M5441 Lumbago with sciatica, right side: Secondary | ICD-10-CM

## 2018-10-22 DIAGNOSIS — M5442 Lumbago with sciatica, left side: Principal | ICD-10-CM

## 2018-10-22 NOTE — Therapy (Signed)
[facility] [place], [place], [address] Phone: [phone]   Fax:  [phone]  Physical Therapy Treatment  Patient Details  Name: Tyrone Mcdonald MRN: [id number] Date of Birth: 01-14-1951 Referring Provider (PT): Ferne Reus PA   Encounter Date: 10/22/2018  PT End of Session - 10/22/18 1134    Visit Number  5    PT Start Time  8757    PT Stop Time  1135    PT Time Calculation (min)  50 min    Activity Tolerance  Patient tolerated treatment well    Behavior During Therapy  [facility] for tasks assessed/performed       Past Medical History:  Diagnosis Date  . Chronic back pain   . Hypertension   . Sciatica     History reviewed. No pertinent surgical history.  There were no vitals filed for this visit.  Subjective Assessment - 10/22/18 1048    Subjective  "Better"    Currently in Pain?  Yes    Pain Score  5     Pain Location  Back    Pain Orientation  Upper;Lower                       [facility] Adult PT Treatment/Exercise - 10/22/18 0001      Neck Exercises: Machines for Strengthening   UBE (Upper Arm Bike)  L3 3 min each    Nustep  L3 x 5 min      Lumbar Exercises: Machines for Strengthening   Cybex Knee Extension  5lb 2x10     Cybex Knee Flexion  20lb x10     Other Lumbar Machine Exercise  Rows & lats 20lb 2x10       Lumbar Exercises: Standing   Other Standing Lumbar Exercises  Shoulder ER red 2x10       Manual Therapy   Manual Therapy  Soft tissue mobilization;Passive ROM;Manual Traction    Soft tissue mobilization  posterior cervical paraspinales into upper traps    Passive ROM  cervical spine    Manual Traction  cervical spine 53X10 sec               PT Short Term Goals - 09/15/18 0855      PT SHORT TERM GOAL #1   Title  independent with initial HEP    Time  2    Period  Weeks    Status  Partially Met        PT Long Term Goals - 10/22/18  1134      PT LONG TERM GOAL #1   Title  increase ROM 25%    Status  Partially Met      PT LONG TERM GOAL #2   Title  decrease pain 25%    Status  Partially Met      PT LONG TERM GOAL #3   Title  increase strength to 4/5    Status  On-going            Plan - 10/22/18 1135    Clinical Impression Statement  Pt is progressing towards all goals. He stated that he is having decrease pain overall. He was able to complete exercises that causes increase pain last time. Progressed to some machine level rows and lats without tissue. Decrease soft tissue tightness in cervical spine. pt has full PROM ROM to cervical spine.     Personal Factors and Comorbidities  Finances;Fitness;Comorbidity 3+    Comorbidities  HTN, OA, CKD    Examination-Activity Limitations  Lift;Stand;Bed Mobility;Locomotion Level;Sit;Stairs    Examination-Participation Restrictions  Yard Work;Cleaning;Laundry    Stability/Clinical Decision Making  Evolving/Moderate complexity    PT Frequency  1x / week    PT Duration  12 weeks    PT Next Visit Plan  continue with STW/stretching; assess goals       Patient will benefit from skilled therapeutic intervention in order to improve the following deficits and impairments:  Abnormal gait, Improper body mechanics, Increased muscle spasms, Decreased activity tolerance, Decreased range of motion, Decreased strength, Difficulty walking, Impaired flexibility, Pain, Postural dysfunction  Visit Diagnosis: Acute bilateral low back pain with bilateral sciatica  Cervicalgia  Muscle spasm of back     Problem List Patient Active Problem List   Diagnosis Date Noted  . Osteoarthritis of both knees 05/23/2017  . CKD (chronic kidney disease) stage 3, GFR 30-59 ml/min (HCC) 05/23/2017  . Positive depression screening 05/23/2017  . Benign prostatic hyperplasia with urinary frequency 03/28/2017  . Lumbar radiculopathy 03/28/2017  . Chronic neck pain 03/28/2017  . Osteoarthritis of  spine with radiculopathy, lumbar region 02/28/2016  . Language barrier affecting health care 02/28/2016  . Abnormal PSA 02/28/2016  . Essential hypertension 07/25/2014  . Family history of diabetes mellitus (DM) 07/25/2014  . Rhinitis, allergic 07/25/2014    Scot Jun, PTA 10/22/2018, 11:37 AM  [place], [place], [address] Phone: [phone]   Fax:  [phone]  Name: Tyrone Mcdonald MRN: [id number] Date of Birth: 02-Mar-1951

## 2018-10-29 ENCOUNTER — Encounter: Payer: Self-pay | Admitting: Physical Therapy

## 2018-10-29 ENCOUNTER — Other Ambulatory Visit: Payer: Self-pay

## 2018-10-29 ENCOUNTER — Ambulatory Visit: Payer: Medicare Other | Admitting: Physical Therapy

## 2018-10-29 DIAGNOSIS — M542 Cervicalgia: Secondary | ICD-10-CM | POA: Diagnosis not present

## 2018-10-29 DIAGNOSIS — M62830 Muscle spasm of back: Secondary | ICD-10-CM

## 2018-10-29 DIAGNOSIS — M5441 Lumbago with sciatica, right side: Secondary | ICD-10-CM | POA: Diagnosis not present

## 2018-10-29 DIAGNOSIS — M5442 Lumbago with sciatica, left side: Principal | ICD-10-CM

## 2018-10-29 NOTE — Therapy (Signed)
[place], [place], [address] Phone: [phone]   Fax:  [phone]  Physical Therapy Treatment  Patient Details  Name: Tyrone Mcdonald MRN: [id number] Date of Birth: [birth year] Referring Provider (PT): Ferne Reus [place]   Encounter Date: 10/29/2018  PT End of Session - 10/29/18 1121    Visit Number  6    Date for PT Re-Evaluation  11/08/18    Authorization Type  Medicaid    PT Start Time  1033    PT Stop Time  1116    PT Time Calculation (min)  43 min    Activity Tolerance  Patient tolerated treatment well    Behavior During Therapy  [facility] for tasks assessed/performed       Past Medical History:  Diagnosis Date  . Chronic back pain   . Hypertension   . Sciatica     History reviewed. No pertinent surgical history.  There were no vitals filed for this visit.  Subjective Assessment - 10/29/18 1034    Subjective  Pt reports a little bit of pain in his neck     Pain Score  5     Pain Location  Neck                       [facility] Adult PT Treatment/Exercise - 10/29/18 0001      Neck Exercises: Machines for Strengthening   UBE (Upper Arm Bike)  L3 3 min each    Nustep  L3 x 6 min      Lumbar Exercises: Stretches   Active Hamstring Stretch  Right;Left;4 reps;10 seconds    Passive Hamstring Stretch  Right;Left;3 reps;10 seconds    Single Knee to Chest Stretch  Right;Left;3 reps;10 seconds    Piriformis Stretch  Right;Left;3 reps;20 seconds      Lumbar Exercises: Machines for Strengthening   Cybex Knee Extension  5lb x10, x5     Cybex Knee Flexion  20lb 2x10     Other Lumbar Machine Exercise  Rows & lats 25lb 2x10       Lumbar Exercises: Standing   Other Standing Lumbar Exercises  Shoulder ER red 2x10       Lumbar Exercises: Seated   Sit to Stand  20 reps   blue chair      Manual Therapy   Manual Therapy  Soft tissue mobilization;Passive ROM;Manual  Traction    Soft tissue mobilization  posterior cervical paraspinales into upper traps    Passive ROM  cervical spine    Manual Traction  cervical spine 3X10 sec               PT Short Term Goals - 09/15/18 0855      PT SHORT TERM GOAL #1   Title  independent with initial HEP    Time  2    Period  Weeks    Status  Partially Met        PT Long Term Goals - 10/22/18 1134      PT LONG TERM GOAL #1   Title  increase ROM 25%    Status  Partially Met      PT LONG TERM GOAL #2   Title  decrease pain 25%    Status  Partially Met      PT LONG TERM GOAL #3   Title  increase strength to 4/5    Status  On-going  Plan - 10/29/18 1121    Clinical Impression Statement  Pt continues to do well overall. He does report some posterior L knee pain with leg extensions. Tactile cues to correct posture needed with seated rows. LE are really tight with HS, and piriformis, R HS >L. Good tissue elasticity with STM to posterior cervical muscles.     Examination-Activity Limitations  Lift;Stand;Bed Mobility;Locomotion Level;Sit;Stairs    Examination-Participation Restrictions  [employer]    Stability/Clinical Decision Making  Evolving/Moderate complexity    Rehab Potential  Fair    PT Frequency  1x / week    PT Duration  12 weeks    PT Treatment/Interventions  ADLs/Self Care Home Management;Cryotherapy;Electrical Stimulation;Moist Heat;Traction;Ultrasound;Therapeutic exercise;Therapeutic activities;Functional mobility training;Balance training;Neuromuscular re-education;Patient/family education;Manual techniques;Dry needling    PT Next Visit Plan  continue with STW/stretching       Patient will benefit from skilled therapeutic intervention in order to improve the following deficits and impairments:  Abnormal gait, Improper body mechanics, Increased muscle spasms, Decreased activity tolerance, Decreased range of motion, Decreased strength, Difficulty walking,  Impaired flexibility, Pain, Postural dysfunction  Visit Diagnosis: Acute bilateral low back pain with bilateral sciatica  Cervicalgia  Muscle spasm of back     Problem List Patient Active Problem List   Diagnosis Date Noted  . Osteoarthritis of both knees 05/23/2017  . CKD (chronic kidney disease) stage 3, GFR 30-59 ml/min (HCC) 05/23/2017  . Positive depression screening 05/23/2017  . Benign prostatic hyperplasia with urinary frequency 03/28/2017  . Lumbar radiculopathy 03/28/2017  . Chronic neck pain 03/28/2017  . Osteoarthritis of spine with radiculopathy, lumbar region 02/28/2016  . Language barrier affecting health care 02/28/2016  . Abnormal PSA 02/28/2016  . Essential hypertension 07/25/2014  . Family history of diabetes mellitus (DM) 07/25/2014  . Rhinitis, allergic 07/25/2014    Scot Jun, PTA 10/29/2018, 11:24 AM  [place] [address] [place], [place], [address] Phone: [phone]   Fax:  [phone]  Name: Tyrone Mcdonald MRN: [id number] Date of Birth: 02/23/1951

## 2018-10-31 ENCOUNTER — Emergency Department (HOSPITAL_COMMUNITY)
Admission: EM | Admit: 2018-10-31 | Discharge: 2018-11-01 | Disposition: A | Discharge status: Home / Self Care | Payer: Medicare Other | Attending: Emergency Medicine | Admitting: Emergency Medicine

## 2018-10-31 ENCOUNTER — Other Ambulatory Visit: Payer: Self-pay

## 2018-10-31 ENCOUNTER — Encounter (HOSPITAL_COMMUNITY): Payer: Self-pay | Admitting: Emergency Medicine

## 2018-10-31 ENCOUNTER — Emergency Department (HOSPITAL_COMMUNITY): Payer: Medicare Other

## 2018-10-31 DIAGNOSIS — I129 Hypertensive chronic kidney disease with stage 1 through stage 4 chronic kidney disease, or unspecified chronic kidney disease: Secondary | ICD-10-CM | POA: Insufficient documentation

## 2018-10-31 DIAGNOSIS — E1122 Type 2 diabetes mellitus with diabetic chronic kidney disease: Secondary | ICD-10-CM | POA: Diagnosis not present

## 2018-10-31 DIAGNOSIS — Z03818 Encounter for observation for suspected exposure to other biological agents ruled out: Secondary | ICD-10-CM | POA: Insufficient documentation

## 2018-10-31 DIAGNOSIS — R0602 Shortness of breath: Secondary | ICD-10-CM | POA: Insufficient documentation

## 2018-10-31 DIAGNOSIS — I10 Essential (primary) hypertension: Secondary | ICD-10-CM

## 2018-10-31 DIAGNOSIS — N183 Chronic kidney disease, stage 3 (moderate): Secondary | ICD-10-CM | POA: Diagnosis not present

## 2018-10-31 DIAGNOSIS — Z20828 Contact with and (suspected) exposure to other viral communicable diseases: Secondary | ICD-10-CM | POA: Diagnosis not present

## 2018-10-31 DIAGNOSIS — Z79899 Other long term (current) drug therapy: Secondary | ICD-10-CM | POA: Insufficient documentation

## 2018-10-31 DIAGNOSIS — J029 Acute pharyngitis, unspecified: Secondary | ICD-10-CM

## 2018-10-31 LAB — BASIC METABOLIC PANEL
Anion gap: 10 (ref 5–15)
BUN: 15 mg/dL (ref 8–23)
CO2: 24 mmol/L (ref 22–32)
Calcium: 9.5 mg/dL (ref 8.9–10.3)
Chloride: 104 mmol/L (ref 98–111)
Creatinine, Ser: 1.47 mg/dL — ABNORMAL HIGH (ref 0.61–1.24)
GFR calc Af Amer: 56 mL/min — ABNORMAL LOW (ref >60)
GFR calc non Af Amer: 48 mL/min — ABNORMAL LOW (ref >60)
Glucose, Bld: 120 mg/dL — ABNORMAL HIGH (ref 70–99)
Potassium: 4 mmol/L (ref 3.5–5.1)
Sodium: 138 mmol/L (ref 135–145)

## 2018-10-31 LAB — CBC
HCT: 41.6 % (ref 39.0–52.0)
Hemoglobin: 13.6 g/dL (ref 13.0–17.0)
MCH: 26.5 pg (ref 26.0–34.0)
MCHC: 32.7 g/dL (ref 30.0–36.0)
MCV: 81.1 fL (ref 80.0–100.0)
Platelets: 195 10*3/uL (ref 150–400)
RBC: 5.13 MIL/uL (ref 4.22–5.81)
RDW: 12.4 % (ref 11.5–15.5)
WBC: 5.8 10*3/uL (ref 4.0–10.5)
nRBC: 0 % (ref 0.0–0.2)

## 2018-10-31 LAB — GROUP A STREP BY PCR: Group A Strep by PCR: NOT DETECTED

## 2018-10-31 MED ORDER — ALBUTEROL SULFATE HFA 108 (90 BASE) MCG/ACT IN AERS
4.0000 | INHALATION_SPRAY | Freq: Once | RESPIRATORY_TRACT | Status: AC
  Administered 2018-11-01: 4 via RESPIRATORY_TRACT
  Filled 2018-10-31: qty 6.7

## 2018-10-31 MED ORDER — IPRATROPIUM BROMIDE HFA 17 MCG/ACT IN AERS
2.0000 | INHALATION_SPRAY | Freq: Once | RESPIRATORY_TRACT | Status: AC
  Administered 2018-11-01: 01:00:00 2 via RESPIRATORY_TRACT
  Filled 2018-10-31: qty 12.9

## 2018-10-31 MED ORDER — ACETAMINOPHEN 325 MG PO TABS
650.0000 mg | ORAL_TABLET | Freq: Once | ORAL | Status: AC
  Administered 2018-10-31: 650 mg via ORAL
  Filled 2018-10-31: qty 2

## 2018-10-31 MED ORDER — IPRATROPIUM BROMIDE 0.02 % IN SOLN: 2.5000 mL | Freq: Once | RESPIRATORY_TRACT | Status: DC

## 2018-10-31 NOTE — ED Provider Notes (Addendum)
[facility]   CSN: [id number] Arrival date & time: 10/31/18  2109    History   Chief Complaint Chief Complaint  Patient presents with  . Sore Throat  . Shortness of Breath    HPI Tyrone Mcdonald is a 68 y.o. male.     Patient c/o sore throat, congestion, mild sob for past couple of days. Symptoms gradual onset, moderate, persistent. Sore throat is diffuse/bilateral - no unilateral throat pain or swelling. +nasal congestion. No sinus pain or headache. Occasional non prod cough. Mild sob. No chest pain or discomfort. Denies specific known ill contacts or known COVID exposure. No fever/chills. No trouble swallowing. No swelling. No orthopnea or pnd. No throat fb sensation.   The history is provided by the patient.  Sore Throat  Associated symptoms include shortness of breath. Pertinent negatives include no chest pain, no abdominal pain and no headaches.  Shortness of Breath  Associated symptoms: sore throat   Associated symptoms: no abdominal pain, no chest pain, no fever, no headaches, no neck pain, no rash and no vomiting     Past Medical History:  Diagnosis Date  . Chronic back pain   . Hypertension   . Sciatica     Patient Active Problem List   Diagnosis Date Noted  . Osteoarthritis of both knees 05/23/2017  . CKD (chronic kidney disease) stage 3, GFR 30-59 ml/min (HCC) 05/23/2017  . Positive depression screening 05/23/2017  . Benign prostatic hyperplasia with urinary frequency 03/28/2017  . Lumbar radiculopathy 03/28/2017  . Chronic neck pain 03/28/2017  . Osteoarthritis of spine with radiculopathy, lumbar region 02/28/2016  . Language barrier affecting health care 02/28/2016  . Abnormal PSA 02/28/2016  . Essential hypertension 07/25/2014  . Family history of diabetes mellitus (DM) 07/25/2014  . Rhinitis, allergic 07/25/2014    History reviewed. No pertinent surgical history.      Home  Medications    Prior to Admission medications   Medication Sig Start Date End Date Taking? Authorizing Provider  albuterol (PROVENTIL HFA;VENTOLIN HFA) 108 (90 Base) MCG/ACT inhaler Inhale 1-2 puffs into the lungs every 6 (six) hours as needed for wheezing or shortness of breath. 06/28/18   Elpidio AnisUpstill, Shari, PA-C  amLODipine (NORVASC) 10 MG tablet Take 1 tablet (10 mg total) by mouth daily. 07/09/18   Anders SimmondsMcClung, Angela M, PA-C  fluticasone (FLONASE) 50 MCG/ACT nasal spray Place 2 sprays into both nostrils daily. 10/19/18   Marcine MatarJohnson, Deborah B, MD  ibuprofen (ADVIL,MOTRIN) 200 MG tablet Take 600 mg by mouth every 6 (six) hours as needed for mild pain.    [provider]  loratadine (CLARITIN) 10 MG tablet Take 1 tablet (10 mg total) by mouth daily. 10/19/18   Marcine MatarJohnson, Deborah B, MD  traMADol (ULTRAM) 50 MG tablet Take 1 tablet (50 mg total) by mouth every 8 (eight) hours as needed. 10/19/18   Marcine MatarJohnson, Deborah B, MD    Family History Family History  Problem Relation Age of Onset  . Hyperlipidemia Father   . Cancer Brother   . Diabetes Paternal Uncle     Social History Social History   Tobacco Use  . Smoking status: Never Smoker  . Smokeless tobacco: Never Used  Substance Use Topics  . Alcohol use: No    Alcohol/week: 0.0 standard drinks  . Drug use: No     Allergies   Patient has no known allergies.   Review of Systems Review of Systems  Constitutional: Negative for chills  and fever.  HENT: Positive for congestion, rhinorrhea and sore throat.   Eyes: Negative for redness.  Respiratory: Positive for shortness of breath.   Cardiovascular: Negative for chest pain.  Gastrointestinal: Negative for abdominal pain, diarrhea and vomiting.  Genitourinary: Negative for flank pain.  Musculoskeletal: Negative for back pain, neck pain and neck stiffness.  Skin: Negative for rash.  Neurological: Negative for headaches.  Hematological: Does not bruise/bleed easily.   Psychiatric/Behavioral: Negative for confusion.     Physical Exam Updated Vital Signs BP (!) 174/100 (BP Location: Left Arm)   Pulse (!) 112   Temp 98.2 F (36.8 C) (Oral)   Resp (!) 22   Ht 1.626 m (5\' 4" )   Wt 74.8 kg   SpO2 100%   BMI 28.31 kg/m   Physical Exam Vitals signs and nursing note reviewed.  Constitutional:      Appearance: Normal appearance. He is well-developed.  HENT:     Head: Atraumatic.     Nose: Congestion present.     Mouth/Throat:     Mouth: Mucous membranes are moist.     Pharynx: Oropharynx is clear. Posterior oropharyngeal erythema present. No oropharyngeal exudate.     Comments: No asymmetric swelling or abscess. No trismus.  Eyes:     General: No scleral icterus.    Conjunctiva/sclera: Conjunctivae normal.     Pupils: Pupils are equal, round, and reactive to light.  Neck:     Musculoskeletal: Normal range of motion and neck supple. No neck rigidity or muscular tenderness.     Trachea: No tracheal deviation.     Comments: No thyromegaly/tenderness. No anterior or post cervical l/a.  Cardiovascular:     Rate and Rhythm: Normal rate and regular rhythm.     Pulses: Normal pulses.     Heart sounds: Normal heart sounds. No murmur. No friction rub. No gallop.   Pulmonary:     Effort: Pulmonary effort is normal. No accessory muscle usage or respiratory distress.     Breath sounds: Normal breath sounds.  Abdominal:     General: Bowel sounds are normal. There is no distension.     Palpations: Abdomen is soft.     Tenderness: There is no abdominal tenderness.     Comments: No hsm.   Genitourinary:    Comments: No cva tenderness. Musculoskeletal:        General: No swelling.     Right lower leg: No edema.     Left lower leg: No edema.  Lymphadenopathy:     Cervical: No cervical adenopathy.  Skin:    General: Skin is warm and dry.     Findings: No rash.  Neurological:     Mental Status: He is alert.     Comments: Alert, speech clear. Steady  gait.   Psychiatric:        Mood and Affect: Mood normal.      ED Treatments / Results  Labs (all labs ordered are listed, but only abnormal results are displayed) Results for orders placed or performed during the hospital encounter of 10/31/18  CBC  Result Value Ref Range   WBC 5.8 4.0 - 10.5 K/uL   RBC 5.13 4.22 - 5.81 MIL/uL   Hemoglobin 13.6 13.0 - 17.0 g/dL   HCT 56.9 79.4 - 80.1 %   MCV 81.1 80.0 - 100.0 fL   MCH 26.5 26.0 - 34.0 pg   MCHC 32.7 30.0 - 36.0 g/dL   RDW 65.5 37.4 - 82.7 %   Platelets  195 150 - 400 K/uL   nRBC 0.0 0.0 - 0.2 %   Dg Chest Port 1 View  Result Date: 10/31/2018 CLINICAL DATA:  Shortness of breath EXAM: PORTABLE CHEST 1 VIEW COMPARISON:  None. FINDINGS: The heart size and mediastinal contours are within normal limits. Both lungs are clear. The visualized skeletal structures are unremarkable. IMPRESSION: No active disease. Electronically Signed   By: Deatra Robinson M.D.   On: 10/31/2018 22:18    EKG None  Radiology Dg Chest [facility] 1 View  Result Date: 10/31/2018 CLINICAL DATA:  Shortness of breath EXAM: PORTABLE CHEST 1 VIEW COMPARISON:  None. FINDINGS: The heart size and mediastinal contours are within normal limits. Both lungs are clear. The visualized skeletal structures are unremarkable. IMPRESSION: No active disease. Electronically Signed   By: Deatra Robinson M.D.   On: 10/31/2018 22:18    Procedures Procedures (including critical care time)  Medications Ordered in ED Medications - No data to display   Initial Impression / Assessment and Plan / ED Course  I have reviewed the triage vital signs and the nursing notes.  Pertinent labs & imaging results that were available during my care of the patient were reviewed by me and considered in my medical decision making (see chart for details).  Labs sent. Imaging. Acetaminophen, po fluids.   Reviewed nursing notes and prior charts for additional history.   Labs reviewed by me - wbc normal,  hgb normal. Chemistries and strep remain pending.   CXR reviewed by me - no pna.   Recheck pt, alert, content, tolerating po fluids.   2307:  Signed out to Dr Manus Gunning to check bmet, strep, and dispo appropriately.      Final Clinical Impressions(s) / ED Diagnoses   Final diagnoses:  None    ED Discharge Orders    None           Cathren Laine, MD 10/31/18 2307

## 2018-10-31 NOTE — ED Triage Notes (Signed)
Patient reports being seen for allergies and now has sore throat and sob with some wheezing noted in triage.  Denies having a fever.

## 2018-10-31 NOTE — ED Provider Notes (Addendum)
Care assumed from Dr. Denton Lank. Patient with SOB, congestion, sore throat. No fever or known COVID exposure.  Awaiting BMet and strep.   Strep test is negative.  Creatinine is near baseline.  Patient given p.o. fluids with improvement in heart rate.  On reassessment, heart rate has improved to 98.  There is inspiratory expiratory wheezing bilaterally.  Patient will be given albuterol and Atrovent inhalers.  Wheezing has improved after inhalers.  Chest x-ray negative for infiltrate.  No increased work of breathing or hypoxia.  Heart rate is improved to the 90s.  Patient tolerating p.o. and ambulatory. D-dimer negative. Low suspicion for ACS or PE.  We will treat supportively for suspected viral respiratory infection.  Cannot rule out coronavirus.  Patient agreeable to home point instructions.  Follow-up with PCP.  Return precautions discussed.   Tyrone Mcdonald was evaluated in Emergency Department on 11/01/2018 for the symptoms described in the history of present illness. He was evaluated in the context of the global COVID-19 pandemic, which necessitated consideration that the patient might be at risk for infection with the SARS-CoV-2 virus that causes COVID-19. Institutional protocols and algorithms that pertain to the evaluation of patients at risk for COVID-19 are in a state of rapid change based on information released by regulatory bodies including the CDC and federal and state organizations. These policies and algorithms were followed during the patient's care in the ED.   EKG Interpretation  Date/Time:  Saturday October 31 2018 23:17:48 EDT Ventricular Rate:  100 PR Interval:    QRS Duration: 77 QT Interval:  364 QTC Calculation: 470 R Axis:   70 Text Interpretation:  Sinus tachycardia Right atrial enlargement Minimal ST depression, inferior leads No significant change was found Confirmed by Glynn Octave [phone]) on 10/31/2018 11:26:01 PM          Alydia Gosser, Jeannett Senior,  MD 11/01/18 7829    Glynn Octave, MD 11/01/18 [phone]

## 2018-10-31 NOTE — Discharge Instructions (Addendum)
It was our pleasure to provide your ER care today - we hope that you feel better.  Drink plenty of fluids. Take acetaminophen as need. You may also try throat lozenges as need for symptom relief.  Your blood pressure is high tonight - continue your blood pressure medication, limit salt intake, and follow up with primary care doctor for recheck this coming week - call office this Monday AM to arrange appointment.   Return to ER if worse, new symptoms, unable to swallow, increased trouble breathing, or other concern.      Person Under Monitoring Name: Tyrone Mcdonald  Location: [address] [place] [address]   Infection Prevention Recommendations for Individuals Confirmed to have, or Being Evaluated for, 2019 Novel Coronavirus (COVID-19) Infection Who Receive Care at Home  Individuals who are confirmed to have, or are being evaluated for, COVID-19 should follow the prevention steps below until a healthcare provider or local or state health department says they can return to normal activities.  Stay home except to get medical care You should restrict activities outside your home, except for getting medical care. Do not go to work, school, or public areas, and do not use public transportation or taxis.  Call ahead before visiting your doctor Before your medical appointment, call the healthcare provider and tell them that you have, or are being evaluated for, COVID-19 infection. This will help the healthcare providers office take steps to keep other people from getting infected. Ask your healthcare provider to call the local or state health department.  Monitor your symptoms Seek prompt medical attention if your illness is worsening (e.g., difficulty breathing). Before going to your medical appointment, call the healthcare provider and tell them that you have, or are being evaluated for, COVID-19 infection. Ask your healthcare provider to call the local or state  health department.  Wear a facemask You should wear a facemask that covers your nose and mouth when you are in the same room with other people and when you visit a healthcare provider. People who live with or visit you should also wear a facemask while they are in the same room with you.  Separate yourself from other people in your home As much as possible, you should stay in a different room from other people in your home. Also, you should use a separate bathroom, if available.  Avoid sharing household items You should not share dishes, drinking glasses, cups, eating utensils, towels, bedding, or other items with other people in your home. After using these items, you should wash them thoroughly with soap and water.  Cover your coughs and sneezes Cover your mouth and nose with a tissue when you cough or sneeze, or you can cough or sneeze into your sleeve. Throw used tissues in a lined trash can, and immediately wash your hands with soap and water for at least 20 seconds or use an alcohol-based hand rub.  Wash your [employer] your hands often and thoroughly with soap and water for at least 20 seconds. You can use an alcohol-based hand sanitizer if soap and water are not available and if your hands are not visibly dirty. Avoid touching your eyes, nose, and mouth with unwashed hands.   Prevention Steps for Caregivers and Household Members of Individuals Confirmed to have, or Being Evaluated for, COVID-19 Infection Being Cared for in the Home  If you live with, or provide care at home for, a person confirmed to have, or being evaluated for,  COVID-19 infection please follow these guidelines to prevent infection:  Follow healthcare providers instructions Make sure that you understand and can help the patient follow any healthcare provider instructions for all care.  Provide for the patients basic needs You should help the patient with basic needs in the home and provide support for  getting groceries, prescriptions, and other personal needs.  Monitor the patients symptoms If they are getting sicker, call his or her medical provider and tell them that the patient has, or is being evaluated for, COVID-19 infection. This will help the healthcare providers office take steps to keep other people from getting infected. Ask the healthcare provider to call the local or state health department.  Limit the number of people who have contact with the patient If possible, have only one caregiver for the patient. Other household members should stay in another home or place of residence. If this is not possible, they should stay in another room, or be separated from the patient as much as possible. Use a separate bathroom, if available. Restrict visitors who do not have an essential need to be in the home.  Keep older adults, very young children, and other sick people away from the patient Keep older adults, very young children, and those who have compromised immune systems or chronic health conditions away from the patient. This includes people with chronic heart, lung, or kidney conditions, diabetes, and cancer.  Ensure good ventilation Make sure that shared spaces in the home have good air flow, such as from an air conditioner or an opened window, weather permitting.  Wash your hands often Wash your hands often and thoroughly with soap and water for at least 20 seconds. You can use an alcohol based hand sanitizer if soap and water are not available and if your hands are not visibly dirty. Avoid touching your eyes, nose, and mouth with unwashed hands. Use disposable paper towels to dry your hands. If not available, use dedicated cloth towels and replace them when they become wet.  Wear a facemask and gloves Wear a disposable facemask at all times in the room and gloves when you touch or have contact with the patients blood, body fluids, and/or secretions or excretions, such as  sweat, saliva, sputum, nasal mucus, vomit, urine, or feces.  Ensure the mask fits over your nose and mouth tightly, and do not touch it during use. Throw out disposable facemasks and gloves after using them. Do not reuse. Wash your hands immediately after removing your facemask and gloves. If your personal clothing becomes contaminated, carefully remove clothing and launder. Wash your hands after handling contaminated clothing. Place all used disposable facemasks, gloves, and other waste in a lined container before disposing them with other household waste. Remove gloves and wash your hands immediately after handling these items.  Do not share dishes, glasses, or other household items with the patient Avoid sharing household items. You should not share dishes, drinking glasses, cups, eating utensils, towels, bedding, or other items with a patient who is confirmed to have, or being evaluated for, COVID-19 infection. After the person uses these items, you should wash them thoroughly with soap and water.  Wash laundry thoroughly Immediately remove and wash clothes or bedding that have blood, body fluids, and/or secretions or excretions, such as sweat, saliva, sputum, nasal mucus, vomit, urine, or feces, on them. Wear gloves when handling laundry from the patient. Read and follow directions on labels of laundry or clothing items and detergent. In general, wash and  dry with the warmest temperatures recommended on the label.  Clean all areas the individual has used often Clean all touchable surfaces, such as counters, tabletops, doorknobs, bathroom fixtures, toilets, phones, keyboards, tablets, and bedside tables, every day. Also, clean any surfaces that may have blood, body fluids, and/or secretions or excretions on them. Wear gloves when cleaning surfaces the patient has come in contact with. Use a diluted bleach solution (e.g., dilute bleach with 1 part bleach and 10 parts water) or a household  disinfectant with a label that says EPA-registered for coronaviruses. To make a bleach solution at home, add 1 tablespoon of bleach to 1 quart (4 cups) of water. For a larger supply, add  cup of bleach to 1 gallon (16 cups) of water. Read labels of cleaning products and follow recommendations provided on product labels. Labels contain instructions for safe and effective use of the cleaning product including precautions you should take when applying the product, such as wearing gloves or eye protection and making sure you have good ventilation during use of the product. Remove gloves and wash hands immediately after cleaning.  Monitor yourself for signs and symptoms of illness Caregivers and household members are considered close contacts, should monitor their health, and will be asked to limit movement outside of the home to the extent possible. Follow the monitoring steps for close contacts listed on the symptom monitoring form.   ? If you have additional questions, contact your local health department or call the epidemiologist on call at [phone] (available 24/7). ? This guidance is subject to change. For the most up-to-date guidance from [facility], please refer to their website: TripMetro.huhttps://www.cdc.gov/coronavirus/2019-ncov/hcp/guidance-prevent-spread.html

## 2018-11-01 DIAGNOSIS — J029 Acute pharyngitis, unspecified: Secondary | ICD-10-CM | POA: Diagnosis not present

## 2018-11-01 LAB — D-DIMER, QUANTITATIVE (NOT AT ARMC): D-Dimer, Quant: 0.3 ug/mL-FEU (ref 0.00–0.50)

## 2018-11-01 LAB — NOVEL CORONAVIRUS, NAA (HOSP ORDER, SEND-OUT TO REF LAB; TAT 18-24 HRS): SARS-CoV-2, NAA: NOT DETECTED

## 2018-11-01 MED ORDER — ALBUTEROL SULFATE HFA 108 (90 BASE) MCG/ACT IN AERS: 1.0000 | INHALATION_SPRAY | Freq: Four times a day (QID) | RESPIRATORY_TRACT | 0 refills | Status: DC | PRN

## 2018-11-01 MED ORDER — PREDNISONE 20 MG PO TABS
60.0000 mg | ORAL_TABLET | Freq: Once | ORAL | Status: AC
  Administered 2018-11-01: 60 mg via ORAL
  Filled 2018-11-01: qty 3

## 2018-11-05 ENCOUNTER — Ambulatory Visit: Payer: Medicare Other | Admitting: Physical Therapy

## 2018-11-08 ENCOUNTER — Encounter (HOSPITAL_COMMUNITY): Payer: Self-pay | Admitting: Emergency Medicine

## 2018-11-08 ENCOUNTER — Other Ambulatory Visit: Payer: Self-pay

## 2018-11-08 ENCOUNTER — Emergency Department (HOSPITAL_COMMUNITY)
Admission: EM | Admit: 2018-11-08 | Discharge: 2018-11-08 | Disposition: A | Discharge status: Home / Self Care | Payer: Medicare Other | Attending: Emergency Medicine | Admitting: Emergency Medicine

## 2018-11-08 ENCOUNTER — Emergency Department (HOSPITAL_COMMUNITY): Payer: Medicare Other

## 2018-11-08 DIAGNOSIS — J40 Bronchitis, not specified as acute or chronic: Secondary | ICD-10-CM | POA: Insufficient documentation

## 2018-11-08 DIAGNOSIS — I129 Hypertensive chronic kidney disease with stage 1 through stage 4 chronic kidney disease, or unspecified chronic kidney disease: Secondary | ICD-10-CM | POA: Diagnosis not present

## 2018-11-08 DIAGNOSIS — N183 Chronic kidney disease, stage 3 (moderate): Secondary | ICD-10-CM | POA: Insufficient documentation

## 2018-11-08 DIAGNOSIS — R062 Wheezing: Secondary | ICD-10-CM | POA: Diagnosis present

## 2018-11-08 DIAGNOSIS — Z79899 Other long term (current) drug therapy: Secondary | ICD-10-CM | POA: Diagnosis not present

## 2018-11-08 DIAGNOSIS — J45909 Unspecified asthma, uncomplicated: Secondary | ICD-10-CM | POA: Diagnosis not present

## 2018-11-08 HISTORY — DX: Unspecified asthma, uncomplicated: J45.909

## 2018-11-08 LAB — BASIC METABOLIC PANEL
Anion gap: 11 (ref 5–15)
BUN: 17 mg/dL (ref 8–23)
CO2: 22 mmol/L (ref 22–32)
Calcium: 9 mg/dL (ref 8.9–10.3)
Chloride: 105 mmol/L (ref 98–111)
Creatinine, Ser: 1.48 mg/dL — ABNORMAL HIGH (ref 0.61–1.24)
GFR calc Af Amer: 55 mL/min — ABNORMAL LOW (ref >60)
GFR calc non Af Amer: 48 mL/min — ABNORMAL LOW (ref >60)
Glucose, Bld: 121 mg/dL — ABNORMAL HIGH (ref 70–99)
Potassium: 4.3 mmol/L (ref 3.5–5.1)
Sodium: 138 mmol/L (ref 135–145)

## 2018-11-08 LAB — CBC WITH DIFFERENTIAL/PLATELET
Abs Immature Granulocytes: 0.02 10*3/uL (ref 0.00–0.07)
Basophils Absolute: 0 10*3/uL (ref 0.0–0.1)
Basophils Relative: 1 %
Eosinophils Absolute: 0.6 10*3/uL — ABNORMAL HIGH (ref 0.0–0.5)
Eosinophils Relative: 10 %
HCT: 40.3 % (ref 39.0–52.0)
Hemoglobin: 12.9 g/dL — ABNORMAL LOW (ref 13.0–17.0)
Immature Granulocytes: 0 %
Lymphocytes Relative: 33 %
Lymphs Abs: 1.8 10*3/uL (ref 0.7–4.0)
MCH: 26.1 pg (ref 26.0–34.0)
MCHC: 32 g/dL (ref 30.0–36.0)
MCV: 81.6 fL (ref 80.0–100.0)
Monocytes Absolute: 0.4 10*3/uL (ref 0.1–1.0)
Monocytes Relative: 6 %
Neutro Abs: 2.7 10*3/uL (ref 1.7–7.7)
Neutrophils Relative %: 50 %
Platelets: 166 10*3/uL (ref 150–400)
RBC: 4.94 MIL/uL (ref 4.22–5.81)
RDW: 12.6 % (ref 11.5–15.5)
WBC: 5.4 10*3/uL (ref 4.0–10.5)
nRBC: 0 % (ref 0.0–0.2)

## 2018-11-08 MED ORDER — AEROCHAMBER PLUS FLO-VU LARGE MISC: 1.0000 | Freq: Once | Status: DC

## 2018-11-08 MED ORDER — PREDNISONE 50 MG PO TABS: 50.0000 mg | ORAL_TABLET | Freq: Every day | ORAL | 0 refills | Status: DC

## 2018-11-08 MED ORDER — ALBUTEROL (5 MG/ML) CONTINUOUS INHALATION SOLN
10.0000 mg/h | INHALATION_SOLUTION | RESPIRATORY_TRACT | Status: AC
  Administered 2018-11-08: 14:00:00 10 mg/h via RESPIRATORY_TRACT
  Filled 2018-11-08: qty 20

## 2018-11-08 MED ORDER — AEROCHAMBER PLUS FLO-VU LARGE MISC
Status: AC
  Filled 2018-11-08: qty 1

## 2018-11-08 MED ORDER — GUAIFENESIN ER 1200 MG PO TB12: 1.0000 | ORAL_TABLET | Freq: Two times a day (BID) | ORAL | 0 refills | Status: DC

## 2018-11-08 MED ORDER — PREDNISONE 20 MG PO TABS
60.0000 mg | ORAL_TABLET | Freq: Once | ORAL | Status: DC
  Filled 2018-11-08: qty 3

## 2018-11-08 MED ORDER — DEXAMETHASONE SODIUM PHOSPHATE 10 MG/ML IJ SOLN
10.0000 mg | Freq: Once | INTRAMUSCULAR | Status: AC
  Administered 2018-11-08: 14:00:00 10 mg via INTRAVENOUS
  Filled 2018-11-08: qty 1

## 2018-11-08 NOTE — ED Notes (Signed)
Patient denies pain and is resting comfortably.  

## 2018-11-08 NOTE — Discharge Instructions (Addendum)
Follow up with your doctor for a recheck. Return here as needed. °

## 2018-11-08 NOTE — ED Notes (Signed)
Pt. returned from XR. 

## 2018-11-08 NOTE — ED Notes (Signed)
Patient transported to X-ray 

## 2018-11-08 NOTE — ED Triage Notes (Addendum)
Pt arrives POV with complaints of sinus congestion and asthma . Pt states his throat is sore as well. Pt seen 4/14 for sinus congestion and given meds with no relief. No distress noted

## 2018-11-08 NOTE — ED Provider Notes (Signed)
[facility] Provider Note   CSN: [id number] Arrival date & time: 11/08/18  1041    History   Chief Complaint Chief Complaint  Patient presents with  . Nasal Congestion    HPI Tyrone Mcdonald is a 68 y.o. male.     HPI Patient presents to the emergency department with wheezing and nasal congestion over the last month and a half.  The patient was seen in the emergency department on 14 April and given inhalers without significant relief of his symptoms.  The patient was also re-evaluated on the 25th and had negative testing at that time for Covid.  Patient states he continues to have wheezing.  The patient states that nothing seems to make the condition better or worse.  Patient states that he did not take any other medications other than once prescribed.  The patient denies chest pain, headache,blurred vision, neck pain, fever, cough, weakness, numbness, dizziness, anorexia, edema, abdominal pain, nausea, vomiting, diarrhea, rash, back pain, dysuria, hematemesis, bloody stool, near syncope, or syncope. Past Medical History:  Diagnosis Date  . Asthma   . Chronic back pain   . Hypertension   . Sciatica     Patient Active Problem List   Diagnosis Date Noted  . Osteoarthritis of both knees 05/23/2017  . CKD (chronic kidney disease) stage 3, GFR 30-59 ml/min (HCC) 05/23/2017  . Positive depression screening 05/23/2017  . Benign prostatic hyperplasia with urinary frequency 03/28/2017  . Lumbar radiculopathy 03/28/2017  . Chronic neck pain 03/28/2017  . Osteoarthritis of spine with radiculopathy, lumbar region 02/28/2016  . Language barrier affecting health care 02/28/2016  . Abnormal PSA 02/28/2016  . Essential hypertension 07/25/2014  . Family history of diabetes mellitus (DM) 07/25/2014  . Rhinitis, allergic 07/25/2014    History reviewed. No pertinent surgical history.      Home Medications    Prior to Admission  medications   Medication Sig Start Date End Date Taking? Authorizing Provider  albuterol (PROVENTIL HFA;VENTOLIN HFA) 108 (90 Base) MCG/ACT inhaler Inhale 1-2 puffs into the lungs every 6 (six) hours as needed for wheezing or shortness of breath. 06/28/18   Elpidio AnisUpstill, Shari, PA-C  albuterol (VENTOLIN HFA) 108 (90 Base) MCG/ACT inhaler Inhale 1-2 puffs into the lungs every 6 (six) hours as needed for wheezing or shortness of breath. 11/01/18   Rancour, Jeannett SeniorStephen, MD  amLODipine (NORVASC) 10 MG tablet Take 1 tablet (10 mg total) by mouth daily. 07/09/18   Anders SimmondsMcClung, Angela M, PA-C  fluticasone (FLONASE) 50 MCG/ACT nasal spray Place 2 sprays into both nostrils daily. 10/19/18   Marcine MatarJohnson, Deborah B, MD  ibuprofen (ADVIL,MOTRIN) 200 MG tablet Take 600 mg by mouth every 6 (six) hours as needed for mild pain.    [provider]  loratadine (CLARITIN) 10 MG tablet Take 1 tablet (10 mg total) by mouth daily. 10/19/18   Marcine MatarJohnson, Deborah B, MD  traMADol (ULTRAM) 50 MG tablet Take 1 tablet (50 mg total) by mouth every 8 (eight) hours as needed. 10/19/18   Marcine MatarJohnson, Deborah B, MD    Family History Family History  Problem Relation Age of Onset  . Hyperlipidemia Father   . Cancer Brother   . Diabetes Paternal Uncle     Social History Social History   Tobacco Use  . Smoking status: Never Smoker  . Smokeless tobacco: Never Used  Substance Use Topics  . Alcohol use: No    Alcohol/week: 0.0 standard drinks  . Drug use: No  Allergies   Patient has no known allergies.   Review of Systems Review of Systems All other systems negative except as documented in the HPI. All pertinent positives and negatives as reviewed in the HPI.  Physical Exam Updated Vital Signs BP (!) 151/96   Pulse 90   Temp 98.3 F (36.8 C) (Oral)   Resp 16   Ht 5\' 4"  (1.626 m)   Wt 74.8 kg   SpO2 99%   BMI 28.31 kg/m   Physical Exam Vitals signs and nursing note reviewed.  Constitutional:      General: He is not in  acute distress.    Appearance: He is well-developed.  HENT:     Head: Normocephalic and atraumatic.  Eyes:     Pupils: Pupils are equal, round, and reactive to light.  Neck:     Musculoskeletal: Normal range of motion and neck supple.  Cardiovascular:     Rate and Rhythm: Normal rate and regular rhythm.     Heart sounds: Normal heart sounds. No murmur. No friction rub. No gallop.   Pulmonary:     Effort: Pulmonary effort is normal. No respiratory distress.     Breath sounds: Normal breath sounds. No wheezing.  Abdominal:     General: Bowel sounds are normal. There is no distension.     Palpations: Abdomen is soft.     Tenderness: There is no abdominal tenderness.  Skin:    General: Skin is warm and dry.     Capillary Refill: Capillary refill takes less than 2 seconds.     Findings: No erythema or rash.  Neurological:     Mental Status: He is alert and oriented to person, place, and time.     Motor: No abnormal muscle tone.     Coordination: Coordination normal.  Psychiatric:        Behavior: Behavior normal.      ED Treatments / Results  Labs (all labs ordered are listed, but only abnormal results are displayed) Labs Reviewed  BASIC METABOLIC PANEL - Abnormal; Notable for the following components:      Result Value   Glucose, Bld 121 (*)    Creatinine, Ser 1.48 (*)    GFR calc non Af Amer 48 (*)    GFR calc Af Amer 55 (*)    All other components within normal limits  CBC WITH DIFFERENTIAL/PLATELET - Abnormal; Notable for the following components:   Hemoglobin 12.9 (*)    Eosinophils Absolute 0.6 (*)    All other components within normal limits    EKG None  Radiology Dg Chest 2 View  Result Date: 11/08/2018 CLINICAL DATA:  Wheezing.  Sinus congestion and asthma. EXAM: CHEST - 2 VIEW COMPARISON:  10/31/2018 and MRI 08/01/2018 FINDINGS: Lungs are clear. Heart and mediastinum are within normal limits. Trachea is midline. Negative for a pneumothorax. Stable  compression deformity involving the superior endplate of L1. No acute bone abnormality. No large pleural effusions. IMPRESSION: No active cardiopulmonary disease. Electronically Signed   By: Richarda Overlie M.D.   On: 11/08/2018 12:18    Procedures Procedures (including critical care time)  Medications Ordered in ED Medications  predniSONE (DELTASONE) tablet 60 mg (has no administration in time range)  albuterol (PROVENTIL,VENTOLIN) solution continuous neb (has no administration in time range)     Initial Impression / Assessment and Plan / ED Course  I have reviewed the triage vital signs and the nursing notes.  Pertinent labs & imaging results that were available during  my care of the patient were reviewed by me and considered in my medical decision making (see chart for details).        Patient has vital signs that are normal and not showing any signs of hypoxia.  Was given an hour-long neb treatment along with steroids.  I have advised the patient that he will need to see his primary doctor soon as possible.  The patient will be given steroids at home as well.  I have advised the patient to return for any worsening in his condition.  I feel that the inhalers alone were not sufficient for his condition.  Final Clinical Impressions(s) / ED Diagnoses   Final diagnoses:  None    ED Discharge Orders    None       Charlestine Night, PA-C 11/08/18 1459    Derwood Kaplan, MD 11/09/18 [phone]

## 2018-11-09 ENCOUNTER — Ambulatory Visit: Payer: Medicare Other | Attending: Physician Assistant | Admitting: Physical Therapy

## 2018-11-09 ENCOUNTER — Encounter: Payer: Self-pay | Admitting: Physical Therapy

## 2018-11-09 DIAGNOSIS — M62830 Muscle spasm of back: Secondary | ICD-10-CM | POA: Diagnosis not present

## 2018-11-09 DIAGNOSIS — M5441 Lumbago with sciatica, right side: Secondary | ICD-10-CM | POA: Insufficient documentation

## 2018-11-09 DIAGNOSIS — M542 Cervicalgia: Secondary | ICD-10-CM | POA: Insufficient documentation

## 2018-11-09 DIAGNOSIS — M5442 Lumbago with sciatica, left side: Secondary | ICD-10-CM | POA: Diagnosis not present

## 2018-11-09 NOTE — Therapy (Signed)
[place], [place], [address] Phone: [phone]   Fax:  [phone]  Physical Therapy Treatment  Patient Details  Name: Tyrone Mcdonald MRN: [id number] Date of Birth: [birth year] Referring Provider (PT): Ferne Reus PA   Encounter Date: 11/09/2018  PT End of Session - 11/09/18 1439    Visit Number  7    Authorization Type  Medicaid    PT Start Time  6381    PT Stop Time  7711    PT Time Calculation (min)  58 min    Activity Tolerance  Patient tolerated treatment well    Behavior During Therapy  [facility] for tasks assessed/performed       Past Medical History:  Diagnosis Date  . Asthma   . Chronic back pain   . Hypertension   . Sciatica     History reviewed. No pertinent surgical history.  There were no vitals filed for this visit.  Subjective Assessment - 11/09/18 1348    Subjective  Pt reports a little pain in his R shoulder blade    Currently in Pain?  Yes    Pain Score  5     Pain Location  Shoulder    Pain Orientation  Right;Upper         [facility] PT Assessment - 11/09/18 0001      AROM   Overall AROM   Within functional limits for tasks performed    AROM Assessment Site  Lumbar;Cervical      Strength   Overall Strength Comments  RUE 4/5, LUE 4+/5; LE 4+/5                   [facility] Adult PT Treatment/Exercise - 11/09/18 0001      Neck Exercises: Machines for Strengthening   UBE (Upper Arm Bike)  L3 4 min each    Nustep  L3 x 6 min      Neck Exercises: Standing   Neck Retraction  20 reps;3 secs    Wall Push Ups  20 reps      Lumbar Exercises: Machines for Strengthening   Other Lumbar Machine Exercise  Rows & lats 25lb 2x10       Lumbar Exercises: Standing   Shoulder Extension  Theraband;20 reps;Both;Strengthening   x2   Theraband Level (Shoulder Extension)  Level 4 (Blue)    Other Standing Lumbar Exercises  Shoulder ER green 2x10     Other  Standing Lumbar Exercises  horizontal abd green 2x10       Electrical Stimulation   Electrical Stimulation Location  R scapula     Electrical Stimulation Action  Pre Mod    Electrical Stimulation Parameters  Supine to pt tolerance     Electrical Stimulation Goals  Pain               PT Short Term Goals - 11/09/18 1411      PT SHORT TERM GOAL #1   Title  independent with initial HEP    Status  Achieved        PT Long Term Goals - 11/09/18 1416      PT LONG TERM GOAL #3   Title  increase strength to 4/5    Status  Partially Met            Plan - 11/09/18 1441    Clinical Impression Statement  Pt has progressed increasing his cervical and lumbar ROM. He has  also increase his strength overall. Some weakness in his R shoulder with ER/IR and abduction. He did well with all exercises displaying good ROM.     Comorbidities  HTN, OA, CKD    Examination-Activity Limitations  Lift;Stand;Bed Mobility;Locomotion Level;Sit;Stairs    Examination-Participation Restrictions  [employer]    Stability/Clinical Decision Making  Evolving/Moderate complexity    Rehab Potential  Fair    PT Frequency  1x / week    PT Duration  12 weeks    PT Treatment/Interventions  ADLs/Self Care Home Management;Cryotherapy;Electrical Stimulation;Moist Heat;Traction;Ultrasound;Therapeutic exercise;Therapeutic activities;Functional mobility training;Balance training;Neuromuscular re-education;Patient/family education;Manual techniques;Dry needling    PT Next Visit Plan  continue with STW/stretching       Patient will benefit from skilled therapeutic intervention in order to improve the following deficits and impairments:  Abnormal gait, Improper body mechanics, Increased muscle spasms, Decreased activity tolerance, Decreased range of motion, Decreased strength, Difficulty walking, Impaired flexibility, Pain, Postural dysfunction  Visit Diagnosis: Acute bilateral low back pain with  bilateral sciatica  Cervicalgia  Muscle spasm of back     Problem List Patient Active Problem List   Diagnosis Date Noted  . Osteoarthritis of both knees 05/23/2017  . CKD (chronic kidney disease) stage 3, GFR 30-59 ml/min (HCC) 05/23/2017  . Positive depression screening 05/23/2017  . Benign prostatic hyperplasia with urinary frequency 03/28/2017  . Lumbar radiculopathy 03/28/2017  . Chronic neck pain 03/28/2017  . Osteoarthritis of spine with radiculopathy, lumbar region 02/28/2016  . Language barrier affecting health care 02/28/2016  . Abnormal PSA 02/28/2016  . Essential hypertension 07/25/2014  . Family history of diabetes mellitus (DM) 07/25/2014  . Rhinitis, allergic 07/25/2014    Scot Jun, PTA 11/09/2018, 2:45 PM  [place], [place], [address] Phone: [phone]   Fax:  [phone]  Name: Tyrone Mcdonald MRN: [id number] Date of Birth: 1950-08-15

## 2018-11-11 ENCOUNTER — Other Ambulatory Visit: Payer: Self-pay

## 2018-11-11 ENCOUNTER — Ambulatory Visit: Payer: Medicare Other | Admitting: Physical Therapy

## 2018-11-11 ENCOUNTER — Encounter: Payer: Self-pay | Admitting: Physical Therapy

## 2018-11-11 DIAGNOSIS — M62830 Muscle spasm of back: Secondary | ICD-10-CM | POA: Diagnosis not present

## 2018-11-11 DIAGNOSIS — M542 Cervicalgia: Secondary | ICD-10-CM | POA: Diagnosis not present

## 2018-11-11 DIAGNOSIS — M5442 Lumbago with sciatica, left side: Secondary | ICD-10-CM | POA: Diagnosis not present

## 2018-11-11 DIAGNOSIS — M5441 Lumbago with sciatica, right side: Secondary | ICD-10-CM | POA: Diagnosis not present

## 2018-11-11 NOTE — Therapy (Signed)
[place], [place], [address] Phone: [phone]   Fax:  [phone]  Physical Therapy Treatment  Patient Details  Name: Tyrone Mcdonald MRN: [id number] Date of Birth: 1950-08-12 Referring Provider (PT): Ferne Reus PA   Encounter Date: 11/11/2018  PT End of Session - 11/11/18 1423    Visit Number  8    Authorization Type  Medicaid    PT Start Time  1340    PT Stop Time  1434    PT Time Calculation (min)  54 min    Activity Tolerance  Patient tolerated treatment well    Behavior During Therapy  [facility] for tasks assessed/performed       Past Medical History:  Diagnosis Date  . Asthma   . Chronic back pain   . Hypertension   . Sciatica     History reviewed. No pertinent surgical history.  There were no vitals filed for this visit.  Subjective Assessment - 11/11/18 1341    Subjective  "Better" a little pain in the back of R shoulder    Currently in Pain?  Yes    Pain Score  2     Pain Location  Shoulder    Pain Orientation  Right;Posterior                       [facility] Adult PT Treatment/Exercise - 11/11/18 0001      Neck Exercises: Machines for Strengthening   UBE (Upper Arm Bike)  L3 4 min     Nustep  L3 x 6 min      Neck Exercises: Standing   Neck Retraction  20 reps;3 secs    Wall Push Ups  20 reps      Lumbar Exercises: Machines for Strengthening   Cybex Knee Extension  5lb 2x10     Cybex Knee Flexion  20lb 2x10     Other Lumbar Machine Exercise  Rows & lats 25lb 2x10       Lumbar Exercises: Standing   Shoulder Extension  Theraband;20 reps;Both;Strengthening    Theraband Level (Shoulder Extension)  Level 4 (Blue)    Shoulder Extension Limitations  -    Other Standing Lumbar Exercises  Shoulder ER red 2x10     Other Standing Lumbar Exercises  horizontal abd green 2x10       Lumbar Exercises: Seated   Sit to Stand  20 reps   holding yellow  ball     Electrical Stimulation   Electrical Stimulation Location  R scapula     Electrical Stimulation Action  Pre Mod    Electrical Stimulation Parameters  supine Tp pt tolerance    Electrical Stimulation Goals  Pain               PT Short Term Goals - 11/09/18 1411      PT SHORT TERM GOAL #1   Title  independent with initial HEP    Status  Achieved        PT Long Term Goals - 11/09/18 1416      PT LONG TERM GOAL #3   Title  increase strength to 4/5    Status  Partially Met            Plan - 11/11/18 1425    Clinical Impression Statement  Pt is progressing well. He enters clinic reporting less pain overall. He did report no pain after neck retractions. All  other exercises tolerated well evident by no subjective reports of increase pain. Good strength and ROM throughout session. Pt responded really well to modality.    Personal Factors and Comorbidities  Finances;Fitness;Comorbidity 3+    Comorbidities  HTN, OA, CKD    Examination-Activity Limitations  Lift;Stand;Bed Mobility;Locomotion Level;Sit;Stairs    Examination-Participation Restrictions  [employer]    Stability/Clinical Decision Making  Evolving/Moderate complexity    Rehab Potential  Fair    PT Frequency  1x / week    PT Duration  12 weeks    PT Treatment/Interventions  ADLs/Self Care Home Management;Cryotherapy;Electrical Stimulation;Moist Heat;Traction;Ultrasound;Therapeutic exercise;Therapeutic activities;Functional mobility training;Balance training;Neuromuscular re-education;Patient/family education;Manual techniques;Dry needling    PT Next Visit Plan  continue with STW/stretching       Patient will benefit from skilled therapeutic intervention in order to improve the following deficits and impairments:     Visit Diagnosis: Acute bilateral low back pain with bilateral sciatica  Cervicalgia  Muscle spasm of back     Problem List Patient Active Problem List   Diagnosis  Date Noted  . Osteoarthritis of both knees 05/23/2017  . CKD (chronic kidney disease) stage 3, GFR 30-59 ml/min (HCC) 05/23/2017  . Positive depression screening 05/23/2017  . Benign prostatic hyperplasia with urinary frequency 03/28/2017  . Lumbar radiculopathy 03/28/2017  . Chronic neck pain 03/28/2017  . Osteoarthritis of spine with radiculopathy, lumbar region 02/28/2016  . Language barrier affecting health care 02/28/2016  . Abnormal PSA 02/28/2016  . Essential hypertension 07/25/2014  . Family history of diabetes mellitus (DM) 07/25/2014  . Rhinitis, allergic 07/25/2014    Scot Jun, PTA 11/11/2018, 2:28 PM  [place], [place], [address] Phone: [phone]   Fax:  [phone]  Name: Tyrone Mcdonald MRN: [id number] Date of Birth: 08/29/1950

## 2018-11-13 DIAGNOSIS — G8929 Other chronic pain: Secondary | ICD-10-CM | POA: Diagnosis not present

## 2018-11-13 DIAGNOSIS — Z79899 Other long term (current) drug therapy: Secondary | ICD-10-CM | POA: Diagnosis not present

## 2018-11-13 DIAGNOSIS — M069 Rheumatoid arthritis, unspecified: Secondary | ICD-10-CM | POA: Diagnosis not present

## 2018-11-13 DIAGNOSIS — F419 Anxiety disorder, unspecified: Secondary | ICD-10-CM | POA: Diagnosis not present

## 2018-11-16 ENCOUNTER — Other Ambulatory Visit: Payer: Self-pay

## 2018-11-16 ENCOUNTER — Emergency Department (HOSPITAL_COMMUNITY)
Admission: EM | Admit: 2018-11-16 | Discharge: 2018-11-16 | Disposition: A | Discharge status: Home / Self Care | Payer: Medicare Other | Attending: Emergency Medicine | Admitting: Emergency Medicine

## 2018-11-16 ENCOUNTER — Ambulatory Visit: Payer: Medicare Other | Admitting: Physical Therapy

## 2018-11-16 ENCOUNTER — Encounter: Payer: Self-pay | Admitting: Physical Therapy

## 2018-11-16 ENCOUNTER — Encounter (HOSPITAL_COMMUNITY): Payer: Self-pay | Admitting: Emergency Medicine

## 2018-11-16 ENCOUNTER — Emergency Department (HOSPITAL_COMMUNITY): Payer: Medicare Other

## 2018-11-16 DIAGNOSIS — M62830 Muscle spasm of back: Secondary | ICD-10-CM

## 2018-11-16 DIAGNOSIS — Z79899 Other long term (current) drug therapy: Secondary | ICD-10-CM | POA: Insufficient documentation

## 2018-11-16 DIAGNOSIS — Z20828 Contact with and (suspected) exposure to other viral communicable diseases: Secondary | ICD-10-CM | POA: Diagnosis not present

## 2018-11-16 DIAGNOSIS — M542 Cervicalgia: Secondary | ICD-10-CM | POA: Diagnosis not present

## 2018-11-16 DIAGNOSIS — M5442 Lumbago with sciatica, left side: Secondary | ICD-10-CM

## 2018-11-16 DIAGNOSIS — J45901 Unspecified asthma with (acute) exacerbation: Secondary | ICD-10-CM | POA: Diagnosis not present

## 2018-11-16 DIAGNOSIS — M5441 Lumbago with sciatica, right side: Secondary | ICD-10-CM | POA: Diagnosis not present

## 2018-11-16 DIAGNOSIS — J4521 Mild intermittent asthma with (acute) exacerbation: Secondary | ICD-10-CM | POA: Insufficient documentation

## 2018-11-16 DIAGNOSIS — R0602 Shortness of breath: Secondary | ICD-10-CM | POA: Diagnosis not present

## 2018-11-16 LAB — CBC WITH DIFFERENTIAL/PLATELET
Abs Immature Granulocytes: 0.04 10*3/uL (ref 0.00–0.07)
Basophils Absolute: 0.1 10*3/uL (ref 0.0–0.1)
Basophils Relative: 1 %
Eosinophils Absolute: 0.5 10*3/uL (ref 0.0–0.5)
Eosinophils Relative: 8 %
HCT: 42.2 % (ref 39.0–52.0)
Hemoglobin: 13.5 g/dL (ref 13.0–17.0)
Immature Granulocytes: 1 %
Lymphocytes Relative: 36 %
Lymphs Abs: 2.4 10*3/uL (ref 0.7–4.0)
MCH: 26.3 pg (ref 26.0–34.0)
MCHC: 32 g/dL (ref 30.0–36.0)
MCV: 82.1 fL (ref 80.0–100.0)
Monocytes Absolute: 0.5 10*3/uL (ref 0.1–1.0)
Monocytes Relative: 8 %
Neutro Abs: 3.2 10*3/uL (ref 1.7–7.7)
Neutrophils Relative %: 46 %
Platelets: 187 10*3/uL (ref 150–400)
RBC: 5.14 MIL/uL (ref 4.22–5.81)
RDW: 12.7 % (ref 11.5–15.5)
WBC: 6.7 10*3/uL (ref 4.0–10.5)
nRBC: 0 % (ref 0.0–0.2)

## 2018-11-16 LAB — BASIC METABOLIC PANEL
Anion gap: 15 (ref 5–15)
BUN: 16 mg/dL (ref 8–23)
CO2: 23 mmol/L (ref 22–32)
Calcium: 8.9 mg/dL (ref 8.9–10.3)
Chloride: 101 mmol/L (ref 98–111)
Creatinine, Ser: 1.53 mg/dL — ABNORMAL HIGH (ref 0.61–1.24)
GFR calc Af Amer: 53 mL/min — ABNORMAL LOW (ref >60)
GFR calc non Af Amer: 46 mL/min — ABNORMAL LOW (ref >60)
Glucose, Bld: 111 mg/dL — ABNORMAL HIGH (ref 70–99)
Potassium: 4.1 mmol/L (ref 3.5–5.1)
Sodium: 139 mmol/L (ref 135–145)

## 2018-11-16 LAB — TROPONIN I: Troponin I: <0.03 ng/mL (ref <0.03)

## 2018-11-16 MED ORDER — PREDNISONE 20 MG PO TABS: 40.0000 mg | ORAL_TABLET | Freq: Every day | ORAL | 0 refills | Status: AC

## 2018-11-16 MED ORDER — ALBUTEROL SULFATE HFA 108 (90 BASE) MCG/ACT IN AERS
2.0000 | INHALATION_SPRAY | Freq: Four times a day (QID) | RESPIRATORY_TRACT | Status: DC
  Administered 2018-11-16: 09:00:00 2 via RESPIRATORY_TRACT
  Filled 2018-11-16: qty 6.7

## 2018-11-16 MED ORDER — METHYLPREDNISOLONE SODIUM SUCC 125 MG IJ SOLR
125.0000 mg | Freq: Once | INTRAMUSCULAR | Status: AC
  Administered 2018-11-16: 125 mg via INTRAVENOUS
  Filled 2018-11-16: qty 2

## 2018-11-16 MED ORDER — ALBUTEROL (5 MG/ML) CONTINUOUS INHALATION SOLN
15.0000 mg/h | INHALATION_SOLUTION | Freq: Once | RESPIRATORY_TRACT | Status: AC
  Administered 2018-11-16: 06:00:00 15 mg/h via RESPIRATORY_TRACT
  Filled 2018-11-16: qty 20

## 2018-11-16 MED ORDER — IPRATROPIUM BROMIDE 0.02 % IN SOLN
1.0000 mg | Freq: Once | RESPIRATORY_TRACT | Status: AC
  Administered 2018-11-16: 06:00:00 1 mg via RESPIRATORY_TRACT
  Filled 2018-11-16: qty 5

## 2018-11-16 NOTE — ED Notes (Signed)
MD speaking with pt.  Pt states he is ready to go home.

## 2018-11-16 NOTE — Discharge Instructions (Addendum)
Please be sure to schedule a follow-up visit with your physician.  Return here for concerning changes in your condition.

## 2018-11-16 NOTE — ED Triage Notes (Signed)
Pt states he has been feeling poorly, his asthma has been bad for two days.  Pt states he has an inhaler but it has not been helping.

## 2018-11-16 NOTE — ED Provider Notes (Signed)
TIME SEEN: 6:07 AM  CHIEF COMPLAINT: Asthma exacerbation  HPI: Patient is a 68 year old male with history of hypertension, asthma who presents to the emergency department with an asthma exacerbation.  States symptoms of shortness of breath, wheezing ongoing for several days and worsened tonight.  Using albuterol inhaler at home without much relief.  No fever, chills, productive cough.  States his chest does feel tight as it normally does with his asthma exacerbations.  He has never required hospitalization or intubation for asthma.  Tested negative for COVID-19 on April 26.  Has been self isolating.  Denies any exposures to any sick contacts and has been staying at home.  Arabic interpreter used.  ROS: See HPI Constitutional: no fever  Eyes: no drainage  ENT: no runny nose   Cardiovascular:   chest pain  Resp: SOB  GI: no vomiting GU: no dysuria Integumentary: no rash  Allergy: no hives  Musculoskeletal: no leg swelling  Neurological: no slurred speech ROS otherwise negative  PAST MEDICAL HISTORY/PAST SURGICAL HISTORY:  Past Medical History:  Diagnosis Date  . Asthma   . Chronic back pain   . Hypertension   . Sciatica     MEDICATIONS:  Prior to Admission medications   Medication Sig Start Date End Date Taking? Authorizing Provider  albuterol (PROVENTIL HFA;VENTOLIN HFA) 108 (90 Base) MCG/ACT inhaler Inhale 1-2 puffs into the lungs every 6 (six) hours as needed for wheezing or shortness of breath. 06/28/18   Elpidio AnisUpstill, Shari, PA-C  albuterol (VENTOLIN HFA) 108 (90 Base) MCG/ACT inhaler Inhale 1-2 puffs into the lungs every 6 (six) hours as needed for wheezing or shortness of breath. 11/01/18   Rancour, Jeannett SeniorStephen, MD  amLODipine (NORVASC) 10 MG tablet Take 1 tablet (10 mg total) by mouth daily. 07/09/18   Anders SimmondsMcClung, Angela M, PA-C  fluticasone (FLONASE) 50 MCG/ACT nasal spray Place 2 sprays into both nostrils daily. 10/19/18   Marcine MatarJohnson, Deborah B, MD  Guaifenesin 1200 MG TB12 Take 1 tablet  (1,200 mg total) by mouth 2 (two) times daily. 11/08/18   Lawyer, Cristal Deerhristopher, PA-C  ibuprofen (ADVIL,MOTRIN) 200 MG tablet Take 600 mg by mouth every 6 (six) hours as needed for mild pain.    [provider]  loratadine (CLARITIN) 10 MG tablet Take 1 tablet (10 mg total) by mouth daily. 10/19/18   Marcine MatarJohnson, Deborah B, MD  predniSONE (DELTASONE) 50 MG tablet Take 1 tablet (50 mg total) by mouth daily. 11/08/18   Lawyer, Cristal Deerhristopher, PA-C  traMADol (ULTRAM) 50 MG tablet Take 1 tablet (50 mg total) by mouth every 8 (eight) hours as needed. 10/19/18   Marcine MatarJohnson, Deborah B, MD    ALLERGIES:  No Known Allergies  SOCIAL HISTORY:  Social History   Tobacco Use  . Smoking status: Never Smoker  . Smokeless tobacco: Never Used  Substance Use Topics  . Alcohol use: No    Alcohol/week: 0.0 standard drinks    FAMILY HISTORY: Family History  Problem Relation Age of Onset  . Hyperlipidemia Father   . Cancer Brother   . Diabetes Paternal Uncle     EXAM: BP (!) 170/96 (BP Location: Right Arm)   Pulse (!) 112   Temp 98.5 F (36.9 C) (Oral)   Resp (!) 24   SpO2 96%  CONSTITUTIONAL: Alert and oriented and responds appropriately to questions.  In mild respiratory distress HEAD: Normocephalic EYES: Conjunctivae clear, pupils appear equal, EOMI ENT: normal nose; moist mucous membranes NECK: Supple, no meningismus, no nuchal rigidity, no LAD  CARD:  Regular and tachycardic; S1 and S2 appreciated; no murmurs, no clicks, no rubs, no gallops RESP: Patient is tachypneic, speaking short sentences, diminished aeration diffusely, expiratory and inspiratory wheezes appreciated, no rhonchi or rales, no hypoxia ABD/GI: Normal bowel sounds; non-distended; soft, non-tender, no rebound, no guarding, no peritoneal signs, no hepatosplenomegaly BACK:  The back appears normal EXT: Normal ROM in all joints; non-tender to palpation; no edema; normal capillary refill; no cyanosis, no calf tenderness or swelling     SKIN: Normal color for age and race; warm; no rash NEURO: Moves all extremities equally PSYCH: The patient's mood and manner are appropriate. Grooming and personal hygiene are appropriate.  MEDICAL DECISION MAKING: Patient here with a mild respiratory distress, asthma exacerbation.  No infectious symptoms and has been isolating at home.  COVID testing on April 26 was negative.  I feel patient needs nebulizer treatments, IV steroids.  I feel he is safe to receive a nebulizer treatment as he was recently COVID negative and has not had any new exposures.  Will obtain labs, chest x-ray.  EKG shows no ischemic abnormality.  Doubt ACS, PE, dissection, pulmonary edema.  ED PROGRESS: Patient's chest x-ray is clear.  Patient will need to be reassessed after continuous breathing treatment.  His labs are pending.  Signed out to oncoming ED physician.   I reviewed all nursing notes, vitals, pertinent previous records, EKGs, lab and urine results, imaging (as available).      EKG Interpretation  Date/Time:  Monday Nov 16 2018 06:00:00 EDT Ventricular Rate:  115 PR Interval:    QRS Duration: 79 QT Interval:  342 QTC Calculation: 473 R Axis:   72 Text Interpretation:  Sinus tachycardia Right atrial enlargement Minimal ST depression, diffuse leads No significant change since last tracing Confirmed by Ward, Baxter Hire [phone]) on 11/16/2018 6:01:15 AM         Ward, Layla Maw, DO 11/16/18 5916

## 2018-11-16 NOTE — Therapy (Signed)
[place], [place], [address] Phone: [phone]   Fax:  [phone]  Physical Therapy Treatment  Patient Details  Name: Tyrone Mcdonald MRN: [id number] Date of Birth: Nov 26, 1950 Referring Provider (PT): Ferne Reus PA   Encounter Date: 11/16/2018  PT End of Session - 11/16/18 1430    Visit Number  9    Date for PT Re-Evaluation  11/08/18    Authorization Type  Medicaid    PT Start Time  1335    PT Stop Time  1432    PT Time Calculation (min)  57 min    Activity Tolerance  Patient tolerated treatment well    Behavior During Therapy  [facility] for tasks assessed/performed       Past Medical History:  Diagnosis Date  . Asthma   . Chronic back pain   . Hypertension   . Sciatica     History reviewed. No pertinent surgical history.  There were no vitals filed for this visit.  Subjective Assessment - 11/16/18 1421    Subjective  "Better"    Currently in Pain?  Yes    Pain Score  2     Pain Location  Neck    Pain Orientation  Right;Posterior    Pain Descriptors / Indicators  Aching                       [facility] Adult PT Treatment/Exercise - 11/16/18 0001      Neck Exercises: Machines for Strengthening   UBE (Upper Arm Bike)  L3 4 min     Nustep  L4 x 6 min      Neck Exercises: Standing   Neck Retraction  20 reps;3 secs    Neck Retraction Limitations  against ball      Lumbar Exercises: Machines for Strengthening   Cybex Knee Extension  5lb 2x15    Cybex Knee Flexion  20lb 2x15    Leg Press  20lb 2x10    Other Lumbar Machine Exercise  Rows & lats 25lb 2x15      Lumbar Exercises: Standing   Other Standing Lumbar Exercises  Shoulder ER green 2x10     Other Standing Lumbar Exercises  Overhead ext with yellow ball       Electrical Stimulation   Electrical Stimulation Location  R scapula     Electrical Stimulation Action  Pre Mod    Electrical  Stimulation Parameters  Supine     Electrical Stimulation Goals  Pain               PT Short Term Goals - 11/09/18 1411      PT SHORT TERM GOAL #1   Title  independent with initial HEP    Status  Achieved        PT Long Term Goals - 11/16/18 1430      PT LONG TERM GOAL #1   Title  increase ROM 25%    Status  Achieved      PT LONG TERM GOAL #2   Title  decrease pain 25%    Status  Partially Met            Plan - 11/16/18 1432    Clinical Impression Statement  Pt continues to do well overall. He has progressed with all exercises increasing the repetitions of each set. Some fatigue noted with leg extensions. Tactile cues needed with neck retraction not to  lean with his entire body.     Comorbidities  HTN, OA, CKD    Examination-Activity Limitations  Lift;Stand;Bed Mobility;Locomotion Level;Sit;Stairs    Examination-Participation Restrictions  Yard Work;Cleaning;Laundry    PT Frequency  1x / week    PT Treatment/Interventions  ADLs/Self Care Home Management;Cryotherapy;Electrical Stimulation;Moist Heat;Traction;Ultrasound;Therapeutic exercise;Therapeutic activities;Functional mobility training;Balance training;Neuromuscular re-education;Patient/family education;Manual techniques;Dry needling    PT Next Visit Plan  continue with STW/stretching       Patient will benefit from skilled therapeutic intervention in order to improve the following deficits and impairments:     Visit Diagnosis: Acute bilateral low back pain with bilateral sciatica  Cervicalgia  Muscle spasm of back     Problem List Patient Active Problem List   Diagnosis Date Noted  . Osteoarthritis of both knees 05/23/2017  . CKD (chronic kidney disease) stage 3, GFR 30-59 ml/min (HCC) 05/23/2017  . Positive depression screening 05/23/2017  . Benign prostatic hyperplasia with urinary frequency 03/28/2017  . Lumbar radiculopathy 03/28/2017  . Chronic neck pain 03/28/2017  . Osteoarthritis of  spine with radiculopathy, lumbar region 02/28/2016  . Language barrier affecting health care 02/28/2016  . Abnormal PSA 02/28/2016  . Essential hypertension 07/25/2014  . Family history of diabetes mellitus (DM) 07/25/2014  . Rhinitis, allergic 07/25/2014    Scot Jun, PTA 11/16/2018, 2:36 PM  [place], [place], [address] Phone: [phone]   Fax:  [phone]  Name: Kiev Labrosse MRN: [id number] Date of Birth: 18-Sep-1950

## 2018-11-16 NOTE — ED Provider Notes (Signed)
Patient is awake, alert, no increased work of breathing, states that he feels better.   Gerhard Munch, MD 11/16/18 [phone]

## 2018-11-18 ENCOUNTER — Ambulatory Visit: Payer: Medicare Other | Admitting: Physical Therapy

## 2018-11-23 ENCOUNTER — Encounter: Payer: Self-pay | Admitting: Physical Therapy

## 2018-11-23 ENCOUNTER — Ambulatory Visit: Payer: Medicare Other | Admitting: Physical Therapy

## 2018-11-23 ENCOUNTER — Other Ambulatory Visit: Payer: Self-pay

## 2018-11-23 DIAGNOSIS — M542 Cervicalgia: Secondary | ICD-10-CM | POA: Diagnosis not present

## 2018-11-23 DIAGNOSIS — M62830 Muscle spasm of back: Secondary | ICD-10-CM

## 2018-11-23 DIAGNOSIS — M5442 Lumbago with sciatica, left side: Secondary | ICD-10-CM

## 2018-11-23 DIAGNOSIS — M5441 Lumbago with sciatica, right side: Secondary | ICD-10-CM | POA: Diagnosis not present

## 2018-11-23 NOTE — Therapy (Signed)
[place], [place], [address] Phone: [phone]   Fax:  [phone]  Physical Therapy Treatment  Patient Details  Name: Tyrone Mcdonald MRN: [id number] Date of Birth: 05-14-1951 Referring Provider (PT): Ferne Reus [place]   Encounter Date: 11/23/2018  PT End of Session - 11/23/18 1428    Visit Number  10    Date for PT Re-Evaluation  11/08/18    Authorization Type  Medicaid    PT Start Time  1345    PT Stop Time  1425    PT Time Calculation (min)  40 min    Activity Tolerance  Patient tolerated treatment well    Behavior During Therapy  [facility] for tasks assessed/performed       Past Medical History:  Diagnosis Date  . Asthma   . Chronic back pain   . Hypertension   . Sciatica     History reviewed. No pertinent surgical history.  There were no vitals filed for this visit.  Subjective Assessment - 11/23/18 1349    Subjective  "Good"    Currently in Pain?  No/denies    Pain Score  0-No pain                       [facility] Adult PT Treatment/Exercise - 11/23/18 0001      Neck Exercises: Machines for Strengthening   UBE (Upper Arm Bike)  L3 4 min     Nustep  L4 x 6 min      Neck Exercises: Standing   Neck Retraction  20 reps;3 secs    Neck Retraction Limitations  against ball      Lumbar Exercises: Machines for Strengthening   Cybex Knee Extension  10lb 2x10    Cybex Knee Flexion  25lb 2x10     Leg Press  40lb 2x10     Other Lumbar Machine Exercise  Rows & lats 25lb 2x15      Lumbar Exercises: Standing   Other Standing Lumbar Exercises  Shoulder ER green 2x10                PT Short Term Goals - 11/09/18 1411      PT SHORT TERM GOAL #1   Title  independent with initial HEP    Status  Achieved        PT Long Term Goals - 11/23/18 1428      PT LONG TERM GOAL #1   Title  increase ROM 25%    Status  Achieved      PT LONG TERM GOAL #2    Title  decrease pain 25%    Status  Achieved      PT LONG TERM GOAL #3   Title  increase strength to 4/5    Status  Partially Met            Plan - 11/23/18 1429    Clinical Impression Statement  All goals met. Tolerated all interventions well.    Personal Factors and Comorbidities  Finances;Fitness;Comorbidity 3+    Comorbidities  HTN, OA, CKD    Examination-Activity Limitations  Lift;Stand;Bed Mobility;Locomotion Level;Sit;Stairs    Examination-Participation Restrictions  [employer]    Stability/Clinical Decision Making  Evolving/Moderate complexity    Rehab Potential  Fair    PT Frequency  1x / week    PT Duration  12 weeks    PT Treatment/Interventions  ADLs/Self Care Home Management;Cryotherapy;Dealer  Stimulation;Moist Heat;Traction;Ultrasound;Therapeutic exercise;Therapeutic activities;Functional mobility training;Balance training;Neuromuscular re-education;Patient/family education;Manual techniques;Dry needling    PT Next Visit Plan  D/C PT       Patient will benefit from skilled therapeutic intervention in order to improve the following deficits and impairments:  Abnormal gait, Improper body mechanics, Increased muscle spasms, Decreased activity tolerance, Decreased range of motion, Decreased strength, Difficulty walking, Impaired flexibility, Pain, Postural dysfunction  Visit Diagnosis: Cervicalgia  Muscle spasm of back  Acute bilateral low back pain with bilateral sciatica     Problem List Patient Active Problem List   Diagnosis Date Noted  . Osteoarthritis of both knees 05/23/2017  . CKD (chronic kidney disease) stage 3, GFR 30-59 ml/min (HCC) 05/23/2017  . Positive depression screening 05/23/2017  . Benign prostatic hyperplasia with urinary frequency 03/28/2017  . Lumbar radiculopathy 03/28/2017  . Chronic neck pain 03/28/2017  . Osteoarthritis of spine with radiculopathy, lumbar region 02/28/2016  . Language barrier affecting  health care 02/28/2016  . Abnormal PSA 02/28/2016  . Essential hypertension 07/25/2014  . Family history of diabetes mellitus (DM) 07/25/2014  . Rhinitis, allergic 07/25/2014   PHYSICAL THERAPY DISCHARGE SUMMARY  Visits from Start of Care: 10  Plan: Patient agrees to discharge.  Patient goals were met. Patient is being discharged due to meeting the stated rehab goals.  ?????       Scot Jun, PTA 11/23/2018, 2:30 PM  [place], [place], [address] Phone: [phone]   Fax:  [phone]  Name: Tyrone Mcdonald MRN: [id number] Date of Birth: 10-Nov-1950

## 2018-12-15 DIAGNOSIS — R946 Abnormal results of thyroid function studies: Secondary | ICD-10-CM | POA: Diagnosis not present

## 2018-12-15 DIAGNOSIS — R7301 Impaired fasting glucose: Secondary | ICD-10-CM | POA: Diagnosis not present

## 2018-12-15 DIAGNOSIS — R0602 Shortness of breath: Secondary | ICD-10-CM | POA: Diagnosis not present

## 2018-12-15 DIAGNOSIS — J4520 Mild intermittent asthma, uncomplicated: Secondary | ICD-10-CM | POA: Diagnosis not present

## 2018-12-15 DIAGNOSIS — E785 Hyperlipidemia, unspecified: Secondary | ICD-10-CM | POA: Diagnosis not present

## 2019-02-01 DIAGNOSIS — J4541 Moderate persistent asthma with (acute) exacerbation: Secondary | ICD-10-CM | POA: Diagnosis not present

## 2019-02-01 DIAGNOSIS — M5440 Lumbago with sciatica, unspecified side: Secondary | ICD-10-CM | POA: Diagnosis not present

## 2019-02-01 DIAGNOSIS — I10 Essential (primary) hypertension: Secondary | ICD-10-CM | POA: Diagnosis not present

## 2019-02-16 NOTE — Progress Notes (Signed)
[facility] Note  02/17/2019     CHIEF COMPLAINT Patient presents for Retina Evaluation   HISTORY OF PRESENT ILLNESS: Tyrone Mcdonald is a 68 y.o. male who presents to the clinic today for:   HPI    Retina Evaluation    In both eyes.  This started weeks ago.  Duration of weeks.  Context:  distance vision.  I, the attending physician,  performed the HPI with the patient and updated documentation appropriately.          Comments    New patient retinal eval Last HgA1c: 5.8 Patient states his Mother has a history of Glaucoma and Father is blind in both eyes (unknown cause).  Patient has never had an eye exam in his lifetime.  Patient states his vision is stable in both eyes.  He denies eye pain or discomfort and denies floaters or flashes of light.   Difficult to obtain HPI due to language barrier---[place] interpreter present today.       Last edited by Bernarda Caffey, MD on 02/17/2019  2:29 PM. (History)    pt is with an interpreter today, pt states he has a friend that sees Dr. Coralyn Pear and he helped him get established here, pt states he does not have any eye problems or any health problems, pt states he is here to get his eyes checked bc his father is blind and he wants to make sure there is nothing going on with his eyes that he needs to be concerned about  Referring physician:   HISTORICAL INFORMATION:   Selected notes from the MEDICAL RECORD NUMBER Self referral for DM exam LEE:  Ocular Hx- PMH-DM (A1c: 5.8 (09.21.18), asthma, HTN, sciatica   CURRENT MEDICATIONS: No current outpatient medications on file. (Ophthalmic Drugs)   No current facility-administered medications for this visit.  (Ophthalmic Drugs)   Current Outpatient Medications (Other)  Medication Sig  . albuterol (PROVENTIL HFA;VENTOLIN HFA) 108 (90 Base) MCG/ACT inhaler Inhale 1-2 puffs into the lungs every 6 (six) hours as needed for wheezing or shortness of  breath. (Patient not taking: Reported on 11/16/2018)  . albuterol (VENTOLIN HFA) 108 (90 Base) MCG/ACT inhaler Inhale 1-2 puffs into the lungs every 6 (six) hours as needed for wheezing or shortness of breath.  Marland Kitchen amLODipine (NORVASC) 10 MG tablet Take 1 tablet (10 mg total) by mouth daily.  . fluticasone (FLONASE) 50 MCG/ACT nasal spray Place 2 sprays into both nostrils daily. (Patient not taking: Reported on 11/16/2018)  . Guaifenesin 1200 MG TB12 Take 1 tablet (1,200 mg total) by mouth 2 (two) times daily. (Patient not taking: Reported on 11/16/2018)  . loratadine (CLARITIN) 10 MG tablet Take 1 tablet (10 mg total) by mouth daily. (Patient not taking: Reported on 11/16/2018)  . traMADol (ULTRAM) 50 MG tablet Take 1 tablet (50 mg total) by mouth every 8 (eight) hours as needed. (Patient not taking: Reported on 11/16/2018)   No current facility-administered medications for this visit.  (Other)      REVIEW OF SYSTEMS:    ALLERGIES No Known Allergies  PAST MEDICAL HISTORY Past Medical History:  Diagnosis Date  . Asthma   . Chronic back pain   . Hypertension   . Sciatica    History reviewed. No pertinent surgical history.  FAMILY HISTORY Family History  Problem Relation Age of Onset  . Hyperlipidemia Father   . Cancer Brother   . Diabetes Paternal Uncle     SOCIAL  HISTORY Social History   Tobacco Use  . Smoking status: Never Smoker  . Smokeless tobacco: Never Used  Substance Use Topics  . Alcohol use: No    Alcohol/week: 0.0 standard drinks  . Drug use: No         OPHTHALMIC EXAM:  Base Eye Exam    Visual Acuity (Snellen - Linear)      Right Left   Dist [place] 20/30 +2 20/50 -1   Dist ph [place] 20/20 -3 20/25 -2       Tonometry (Tonopen, 1:32 PM)      Right Left   Pressure 25 30  Patient squeezing very hard and holding breath       Pupils      Dark Light Shape React APD   Right 3 2 Round Brisk 0   Left 3 2 Round Brisk        Visual Fields      Left Right     Full Full  Difficult/Poor cooperation and understanding of fixation on straight ahead target.       Extraocular Movement      Right Left    Full Full       Neuro/Psych    Oriented x3: Yes   Mood/Affect: Normal       Dilation    Both eyes: 1.0% Mydriacyl, 2.5% Phenylephrine @ 1:33 PM        Slit Lamp and Fundus Exam    Slit Lamp Exam      Right Left   Lids/Lashes Dermatochalasis - upper lid Dermatochalasis - upper lid   Conjunctiva/Sclera nasal Pinguecula, mild Melanosis nasal Pinguecula, mild Melanosis   Cornea Arcus, 1+ Punctate epithelial erosions Arcus, 1+ Punctate epithelial erosions   Anterior Chamber Deep and quiet Deep and quiet   Iris Round and dilated, No NVI Round and dilated, No NVI   Lens 3+ Nuclear sclerosis mild brunescence, 2+ Cortical cataract 3+ Nuclear sclerosis mild brunescence, 2+ Cortical cataract   Vitreous mild Vitreous syneresis mild Vitreous syneresis       Fundus Exam      Right Left   Disc Pink and Sharp Pink and Sharp   C/D Ratio 0.6 0.6   Macula Flat, Blunted foveal reflex, mild RPE mottling and clumping, No heme or edema Flat, Blunted foveal reflex, mild RPE mottling and clumping, No heme or edema   Vessels mild Vascular attenuation mild Vascular attenuation   Periphery Attached    Attached           Refraction    Wearing Rx      Sphere   Right None   Left None       Manifest Refraction      Sphere Cylinder Axis Dist VA   Right -0.50 +0.50 180 20/20-1   Left -0.25 +1.00 095 20/25          IMAGING AND PROCEDURES  Imaging and Procedures for @TODAY @  OCT, Retina - OU - Both Eyes       Right Eye Quality was good. Central Foveal Thickness: 252. Progression has no prior data. Findings include normal foveal contour, no IRF, no SRF, vitreomacular adhesion .   Left Eye Quality was good. Central Foveal Thickness: 282. Progression has no prior data. Findings include normal foveal contour, no IRF, no SRF.   Notes *Images  captured and stored on drive  Diagnosis / Impression:  NFP, no IRF/SRF OU No DME OU  Clinical management:  See below  Abbreviations: NFP -  Normal foveal profile. CME - cystoid macular edema. PED - pigment epithelial detachment. IRF - intraretinal fluid. SRF - subretinal fluid. EZ - ellipsoid zone. ERM - epiretinal membrane. ORA - outer retinal atrophy. ORT - outer retinal tubulation. SRHM - subretinal hyper-reflective material                 ASSESSMENT/PLAN:    ICD-10-CM   1. Combined forms of age-related cataract of both eyes  H25.813   2. Retinal edema  H35.81 OCT, Retina - OU - Both Eyes  3. Essential hypertension  I10   4. Hypertensive retinopathy of both eyes  H35.033   5. Glaucoma suspect of both eyes  H40.003     1. Mixed form age related cataracts OU  - The symptoms of cataract, surgical options, and treatments and risks were discussed with patient.  - discussed diagnosis and progression  - mild brunescence and likely approaching visual significance  - [facility] care and cataract eval  - f/u here prn  2. No retinal edema on exam or OCT  3,4. Hypertensive retinopathy OU  - discussed importance of tight BP control  - monitor  5. Glaucoma suspect OU  - +family history of glaucoma  - IOP erroneously high due to squeezing  - mild cupping on dilated exam  - [facility]   Ophthalmic Meds Ordered this visit:  No orders of the defined types were placed in this encounter.      Return if symptoms worsen or fail to improve.  There are no Patient Instructions on file for this visit.   Explained the diagnoses, plan, and follow up with the patient and they expressed understanding.  Patient expressed understanding of the importance of proper follow up care.   This document serves as a record of services personally performed by Karie ChimeraBrian G. Arin Vanosdol, MD, PhD. It was created on their  behalf by Laurian BrimAmanda Brown, OA, an ophthalmic assistant. The creation of this record is the provider's dictation and/or activities during the visit.    Electronically signed by: Laurian BrimAmanda Brown, OA  08.11.2020 3:59 PM    Karie ChimeraBrian G. Salem Mastrogiovanni, M.D., Ph.D. Diseases & Surgery of the Retina and Vitreous [facility]  I have reviewed the above documentation for accuracy and completeness, and I agree with the above. Karie ChimeraBrian G. Joeleen Wortley, M.D., Ph.D. 02/17/19 3:59 PM    Abbreviations: M myopia (nearsighted); A astigmatism; H hyperopia (farsighted); P presbyopia; Mrx spectacle prescription;  CTL contact lenses; OD right eye; OS left eye; OU both eyes  XT exotropia; ET esotropia; PEK punctate epithelial keratitis; PEE punctate epithelial erosions; DES dry eye syndrome; MGD meibomian gland dysfunction; ATs artificial tears; PFAT's preservative free artificial tears; NSC nuclear sclerotic cataract; PSC posterior subcapsular cataract; ERM epi-retinal membrane; PVD posterior vitreous detachment; RD retinal detachment; DM diabetes mellitus; DR diabetic retinopathy; NPDR non-proliferative diabetic retinopathy; PDR proliferative diabetic retinopathy; CSME clinically significant macular edema; DME diabetic macular edema; dbh dot blot hemorrhages; CWS cotton wool spot; POAG primary open angle glaucoma; C/D cup-to-disc ratio; HVF humphrey visual field; GVF goldmann visual field; OCT optical coherence tomography; IOP intraocular pressure; BRVO Branch retinal vein occlusion; CRVO central retinal vein occlusion; CRAO central retinal artery occlusion; BRAO branch retinal artery occlusion; RT retinal tear; SB scleral buckle; PPV pars plana vitrectomy; VH Vitreous hemorrhage; PRP panretinal laser photocoagulation; IVK intravitreal kenalog; VMT vitreomacular traction; MH Macular hole;  NVD neovascularization of  the disc; NVE neovascularization elsewhere; AREDS age related eye disease study; ARMD age related macular  degeneration; POAG primary open angle glaucoma; EBMD epithelial/anterior basement membrane dystrophy; ACIOL anterior chamber intraocular lens; IOL intraocular lens; PCIOL posterior chamber intraocular lens; Phaco/IOL phacoemulsification with intraocular lens placement; PRK photorefractive keratectomy; LASIK laser assisted in situ keratomileusis; HTN hypertension; DM diabetes mellitus; COPD chronic obstructive pulmonary disease

## 2019-02-17 ENCOUNTER — Other Ambulatory Visit: Payer: Self-pay

## 2019-02-17 ENCOUNTER — Ambulatory Visit (INDEPENDENT_AMBULATORY_CARE_PROVIDER_SITE_OTHER): Payer: Medicare Other | Admitting: Ophthalmology

## 2019-02-17 ENCOUNTER — Encounter (INDEPENDENT_AMBULATORY_CARE_PROVIDER_SITE_OTHER): Payer: Self-pay | Admitting: Ophthalmology

## 2019-02-17 DIAGNOSIS — H25813 Combined forms of age-related cataract, bilateral: Secondary | ICD-10-CM | POA: Diagnosis not present

## 2019-02-17 DIAGNOSIS — H35033 Hypertensive retinopathy, bilateral: Secondary | ICD-10-CM

## 2019-02-17 DIAGNOSIS — I10 Essential (primary) hypertension: Secondary | ICD-10-CM

## 2019-02-17 DIAGNOSIS — H3581 Retinal edema: Secondary | ICD-10-CM | POA: Diagnosis not present

## 2019-02-17 DIAGNOSIS — H40003 Preglaucoma, unspecified, bilateral: Secondary | ICD-10-CM | POA: Diagnosis not present

## 2019-03-08 DIAGNOSIS — H2513 Age-related nuclear cataract, bilateral: Secondary | ICD-10-CM | POA: Diagnosis not present

## 2019-03-23 DIAGNOSIS — E785 Hyperlipidemia, unspecified: Secondary | ICD-10-CM | POA: Diagnosis not present

## 2019-03-23 DIAGNOSIS — R944 Abnormal results of kidney function studies: Secondary | ICD-10-CM | POA: Diagnosis not present

## 2019-03-23 DIAGNOSIS — R7301 Impaired fasting glucose: Secondary | ICD-10-CM | POA: Diagnosis not present

## 2019-03-23 DIAGNOSIS — Z79899 Other long term (current) drug therapy: Secondary | ICD-10-CM | POA: Diagnosis not present

## 2019-03-23 DIAGNOSIS — R5383 Other fatigue: Secondary | ICD-10-CM | POA: Diagnosis not present

## 2019-03-23 DIAGNOSIS — M545 Low back pain: Secondary | ICD-10-CM | POA: Diagnosis not present

## 2019-04-09 IMAGING — CR DG KNEE COMPLETE 4+V*L*
4 series · 4 of 4 positions shown · non-contrast
Comparison: None.

CLINICAL DATA: Bilateral knee pain and back pain, status post fall.

EXAM:
LEFT KNEE - COMPLETE 4+ VIEW

[t knee ap left]
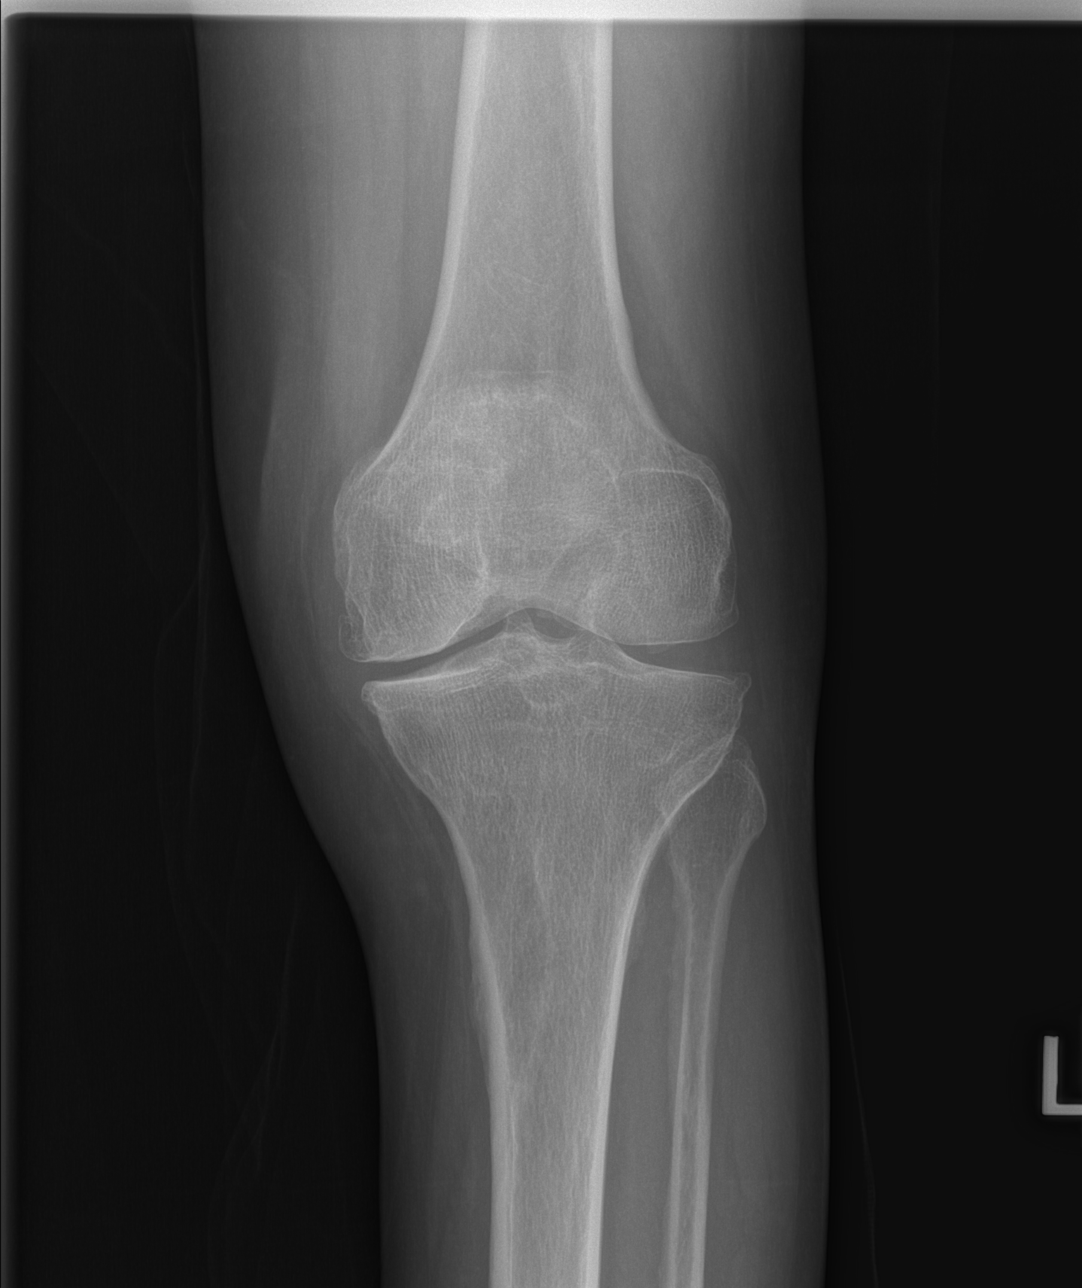

[t knee obl left (1 of 2)]
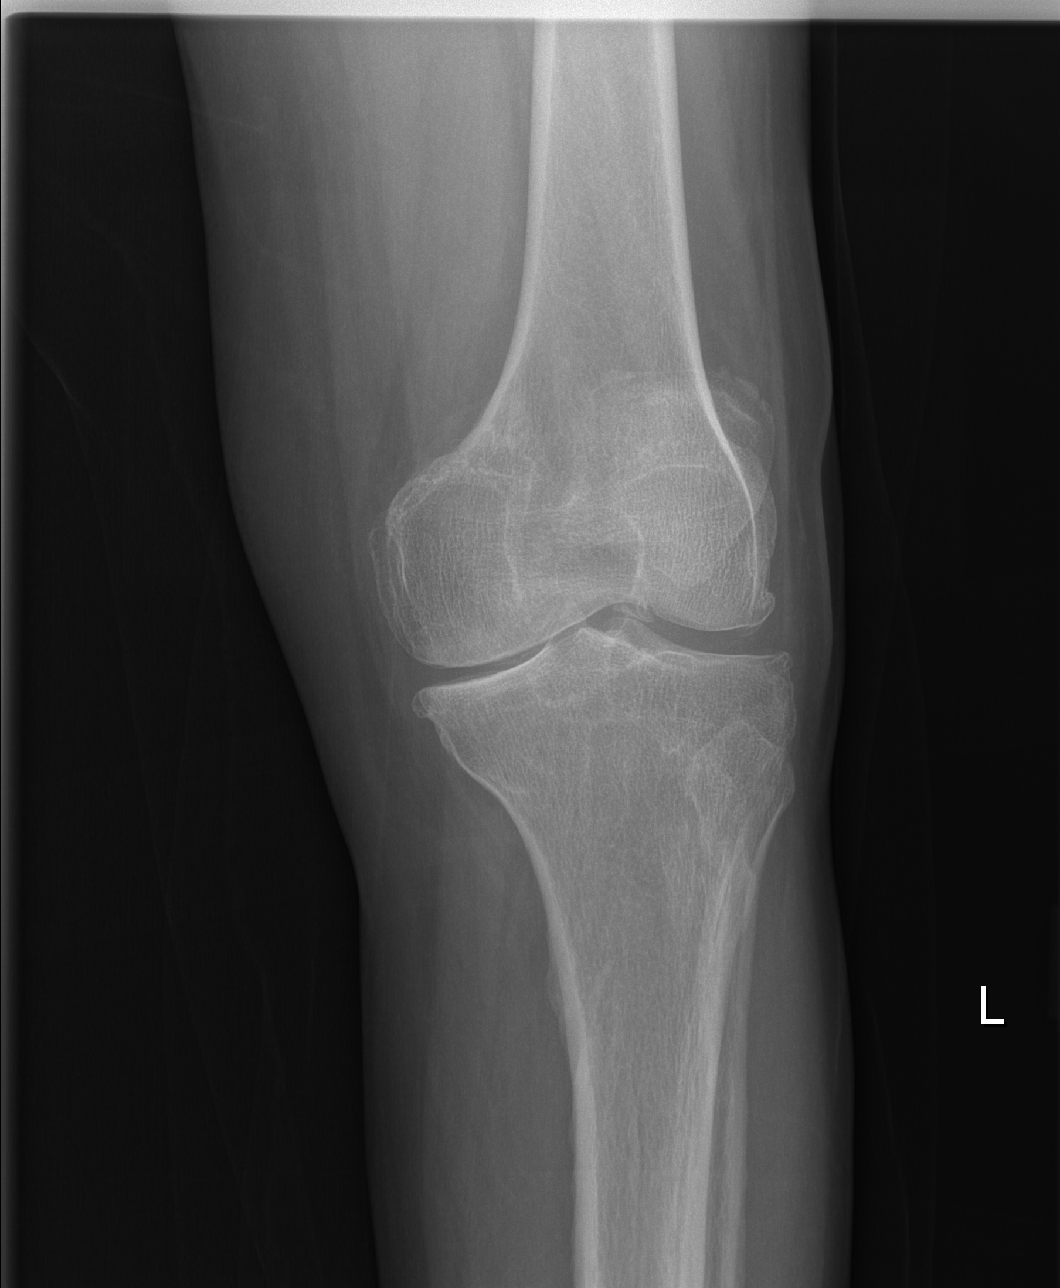

[t knee obl left (2 of 2)]
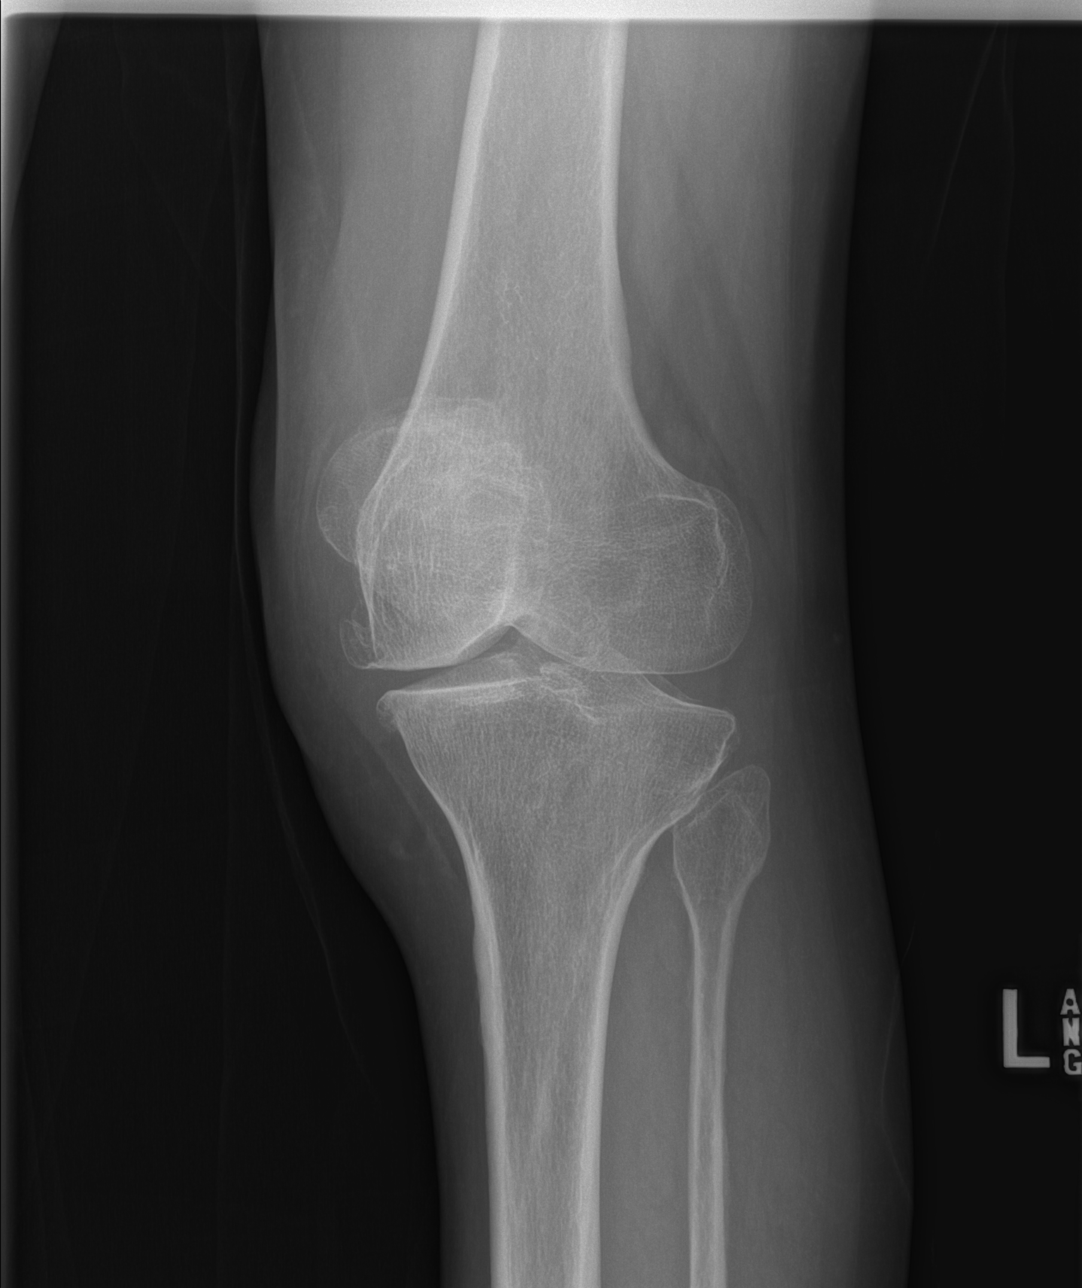

[t knee lat left]
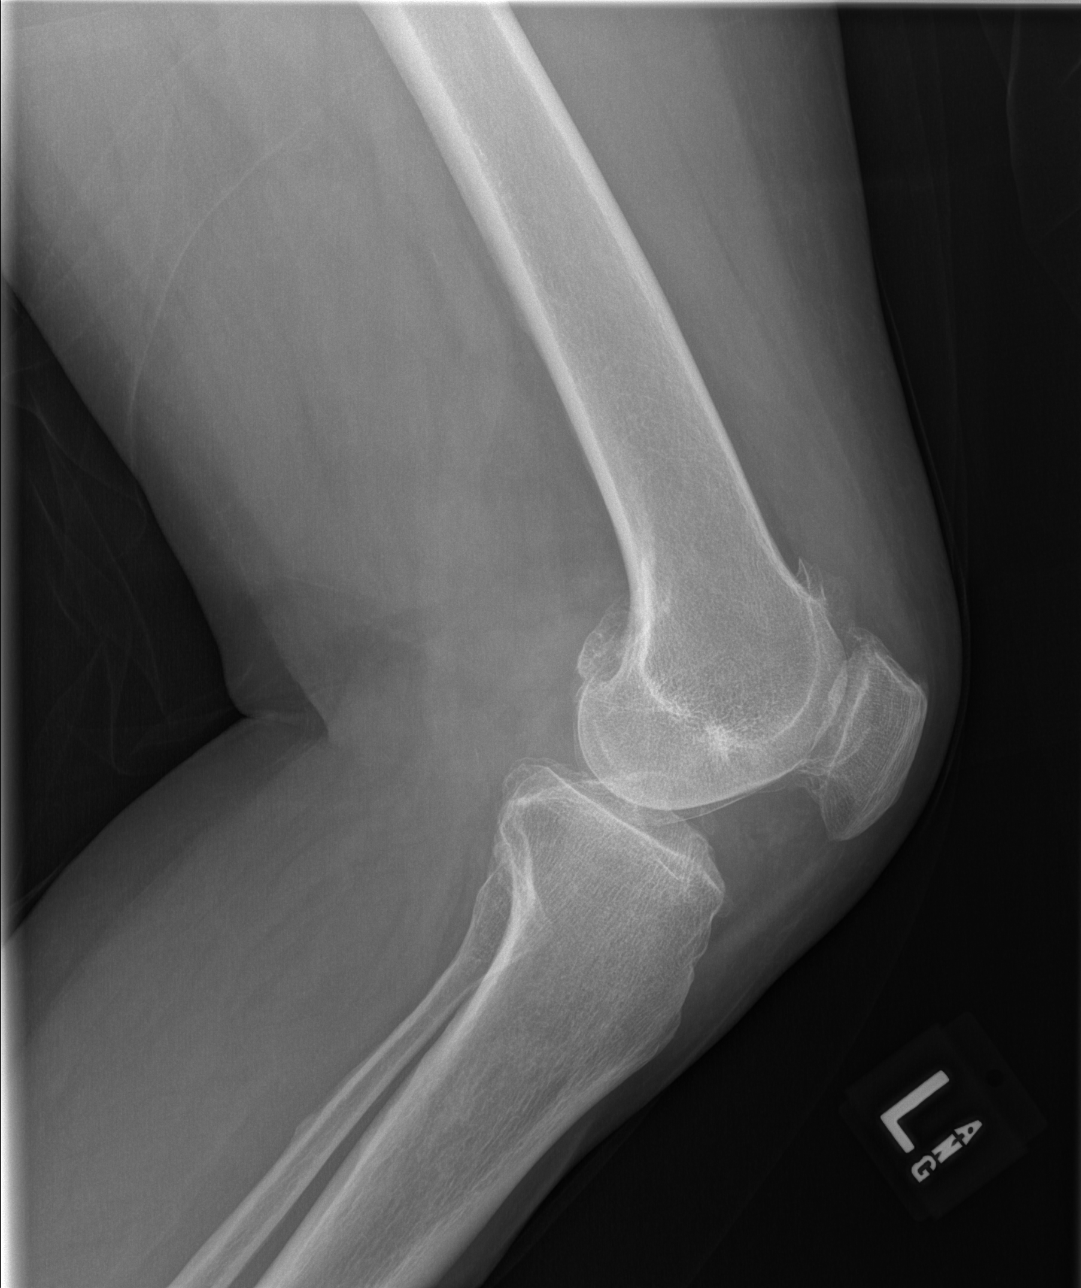

[4 of 4 positions shown; findings below may reference images not displayed]

FINDINGS: Mild degenerative narrowing of the medial compartment with
associated mild osseous spurring. Additional mild degenerative
osseous spurring at the patellofemoral compartment. Lateral
compartment is relatively well maintained.

No acute appearing osseous abnormality. No fracture line or
displaced fracture fragment seen. No appreciable joint effusion and
adjacent soft tissues are unremarkable.
IMPRESSION: No acute findings. Mild degenerative change.

## 2019-04-09 IMAGING — CR DG THORACIC SPINE 3V
3 series · 3 of 3 positions shown · non-contrast
Comparison: None.

CLINICAL DATA: Thoracic back pain, status post fall.

EXAM:
THORACIC SPINE - 3 VIEWS

[t thoracic spine ap]
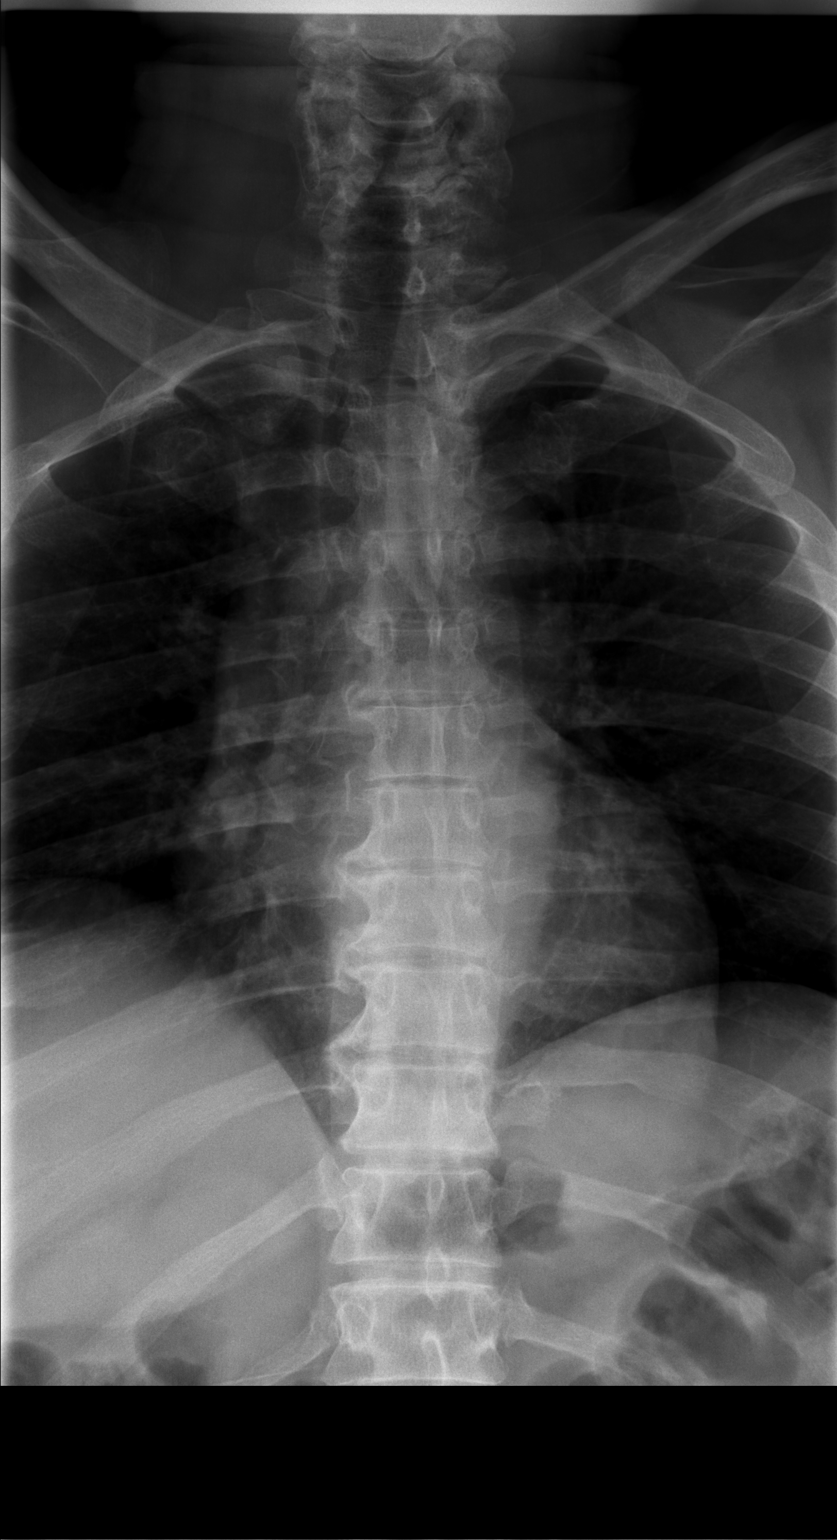

[t thoracic spine lat]
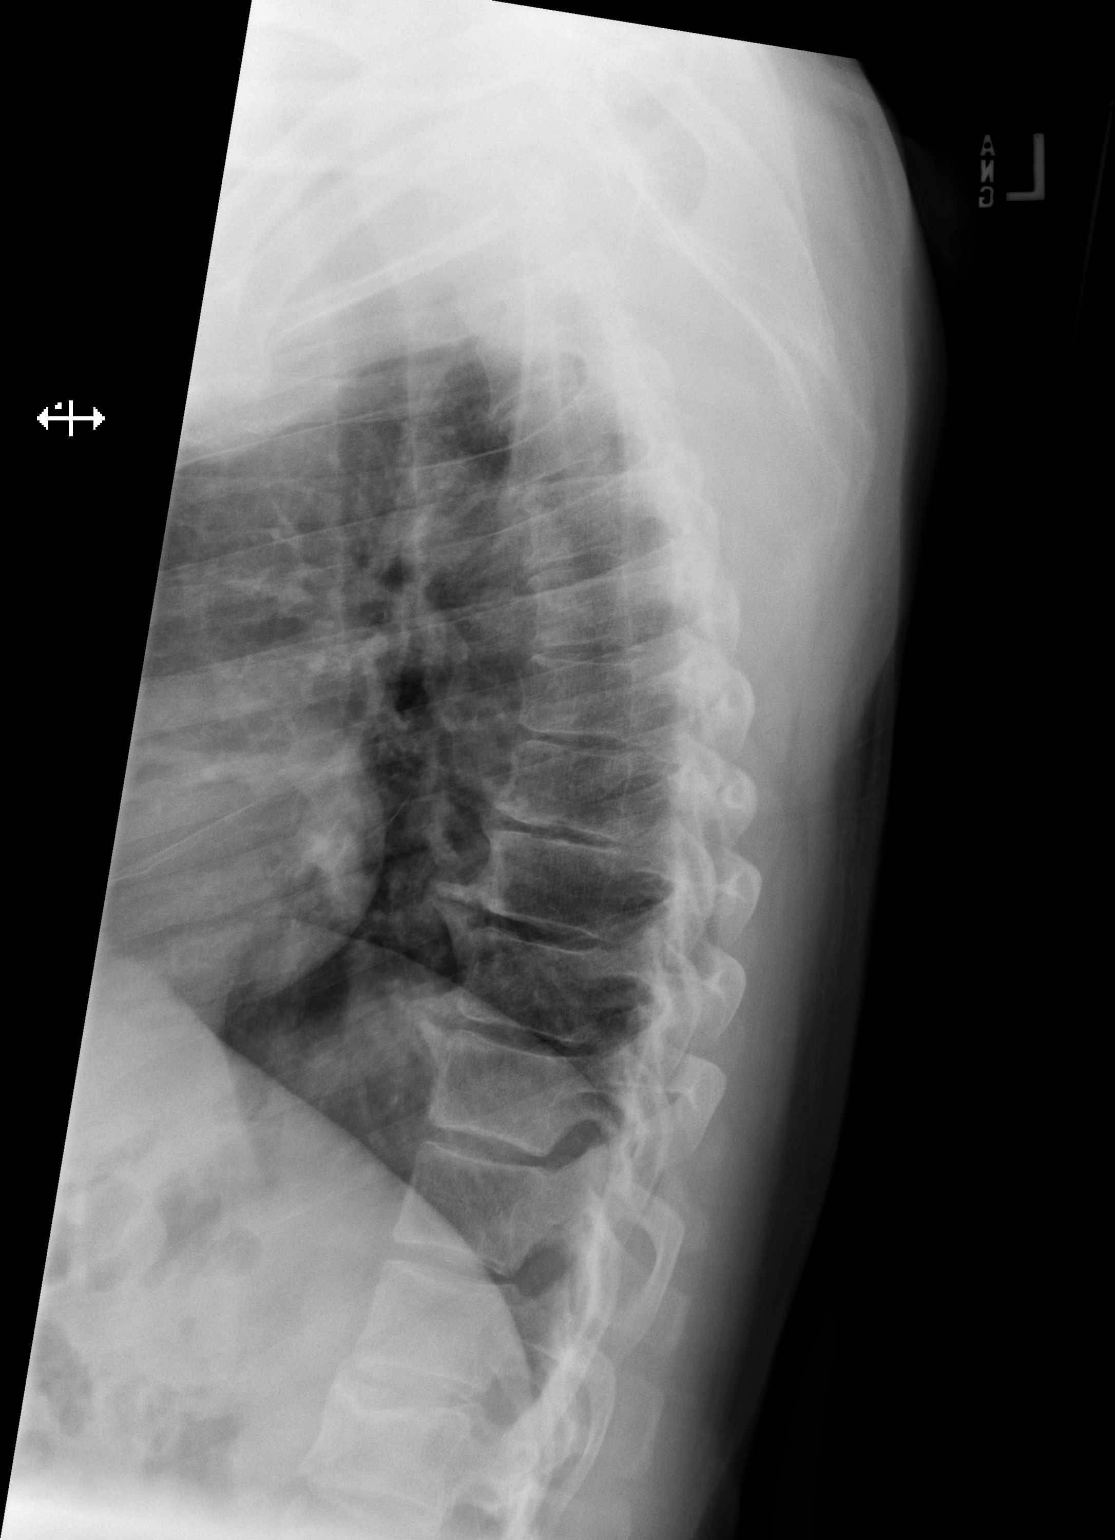

[t thoracic swimmers]
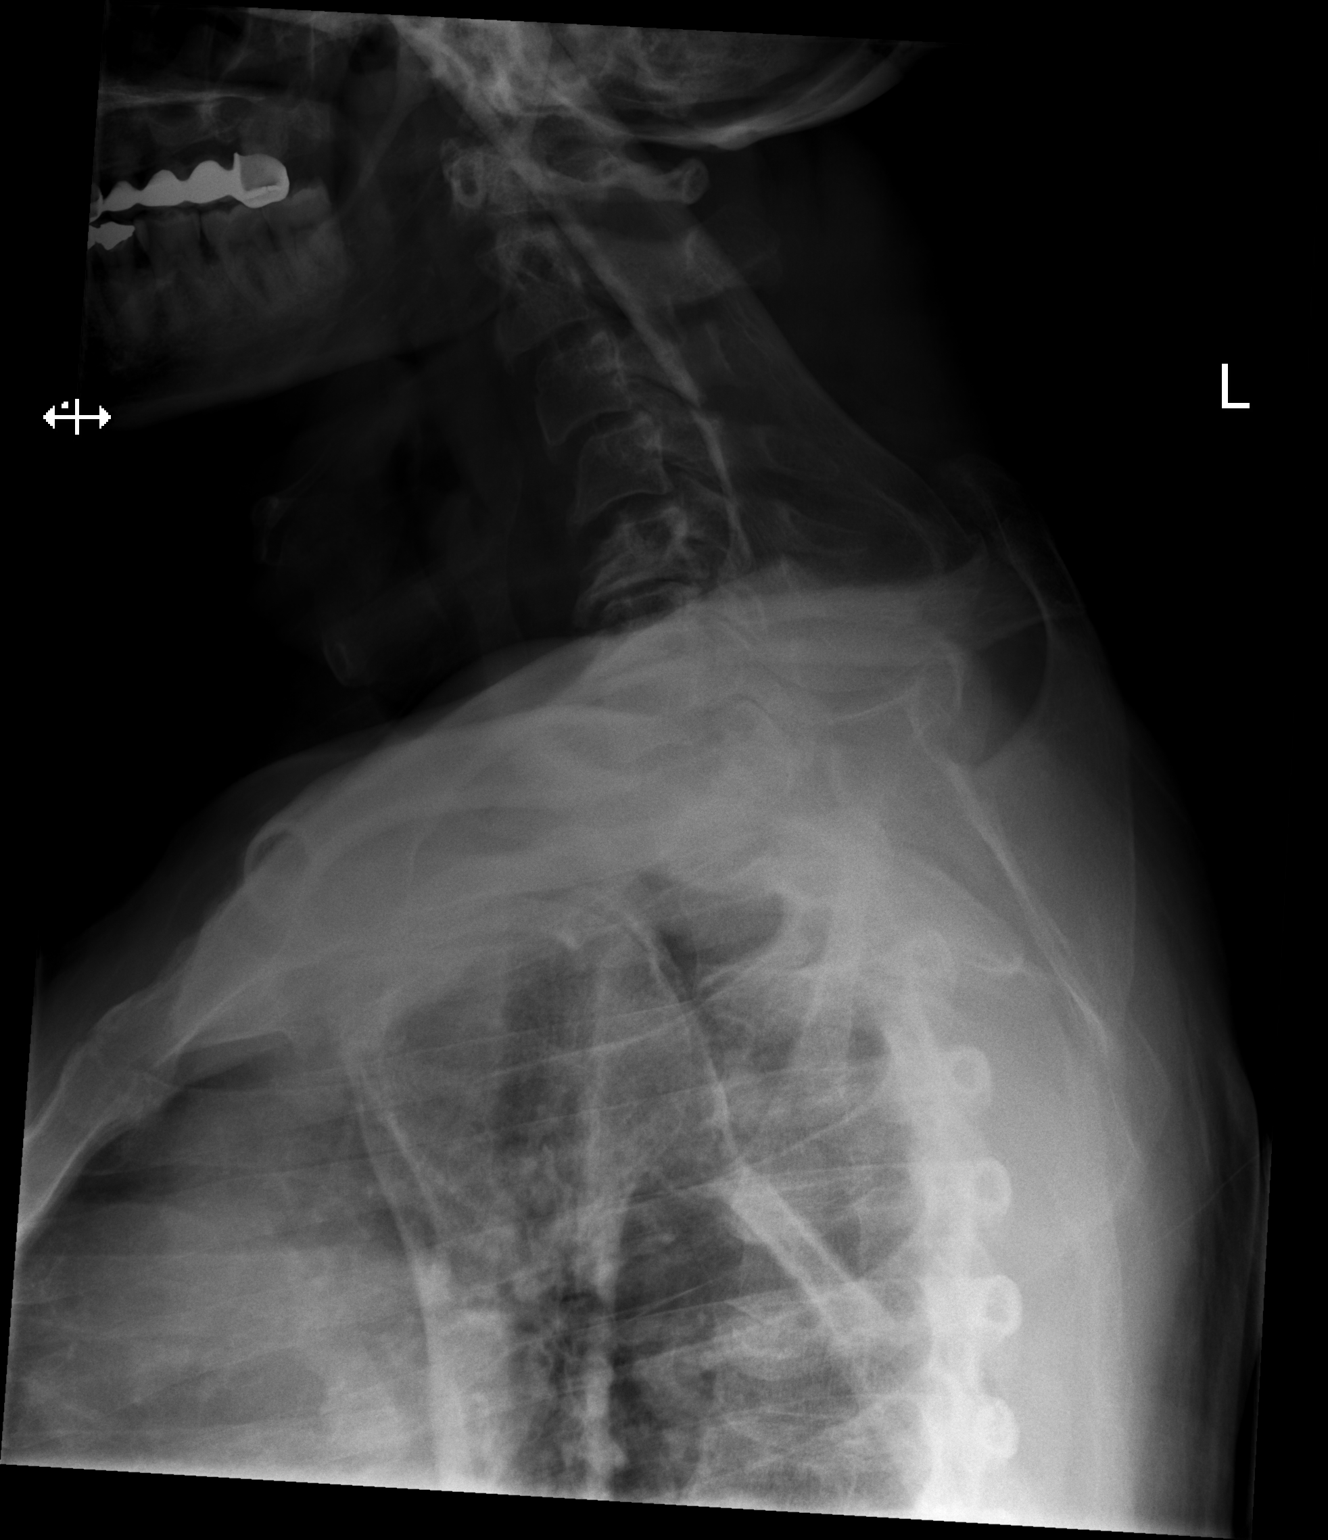

[3 of 3 positions shown; findings below may reference images not displayed]

FINDINGS: Mild degenerative spurring throughout the slightly scoliotic
thoracic spine. No acute fracture or evidence of acute vertebral
body subluxation. Visualized paravertebral soft tissues are
unremarkable.
IMPRESSION: No acute findings. Mild degenerative changes.

## 2019-04-09 IMAGING — CT CT CERVICAL SPINE W/O CM
3 of 4 series · 13 of 33 positions shown, 16 images · non-contrast
Comparison: None.

CLINICAL DATA: Fall last night, neck pain.

EXAM:
CT CERVICAL SPINE WITHOUT CONTRAST
TECHNIQUE: Multidetector CT imaging of the cervical spine was performed without
intravenous contrast. Multiplanar CT image reconstructions were also
generated.

[Series 7: orthogonal bone · axial · 0.24mm/px · z∈[-211,-89]mm · 5 of 94 slices shown, 7 images]
[im 16/94  soft-tissue]
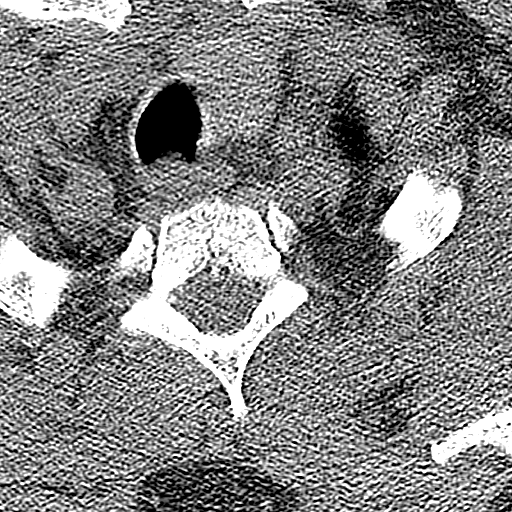
[im 16/94  bone]
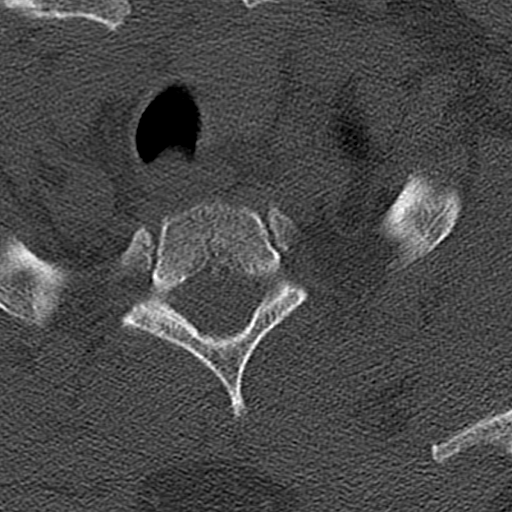
[im 32/94  bone]
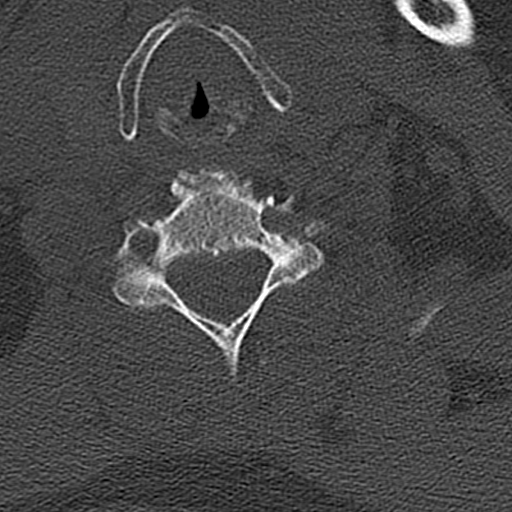
[im 47/94  bone]
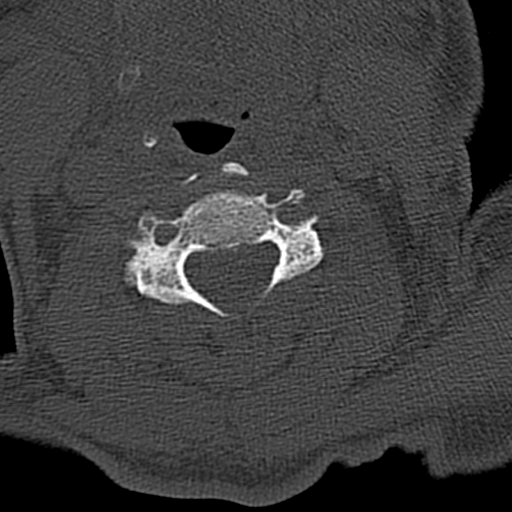
[im 63/94  bone]
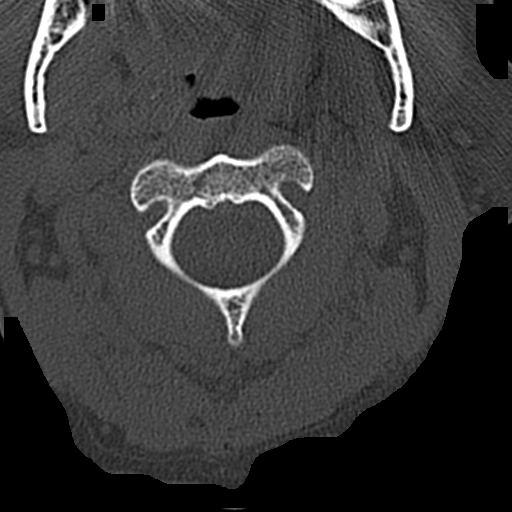
[im 78/94  soft-tissue]
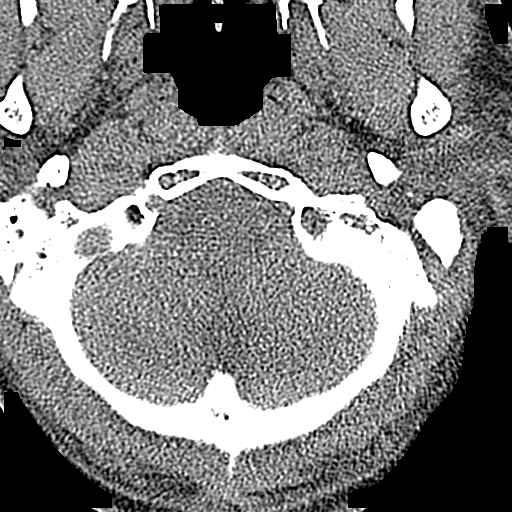
[im 78/94  bone]
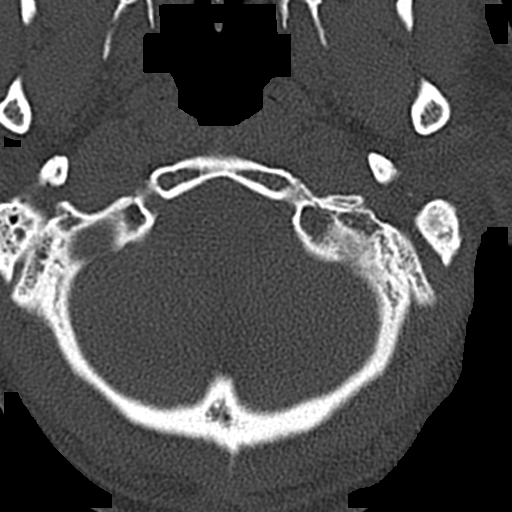

[Series 8: coronal bone · coronal · 0.26mm/px · 3 of 66 slices shown]
[im 14/66  bone]
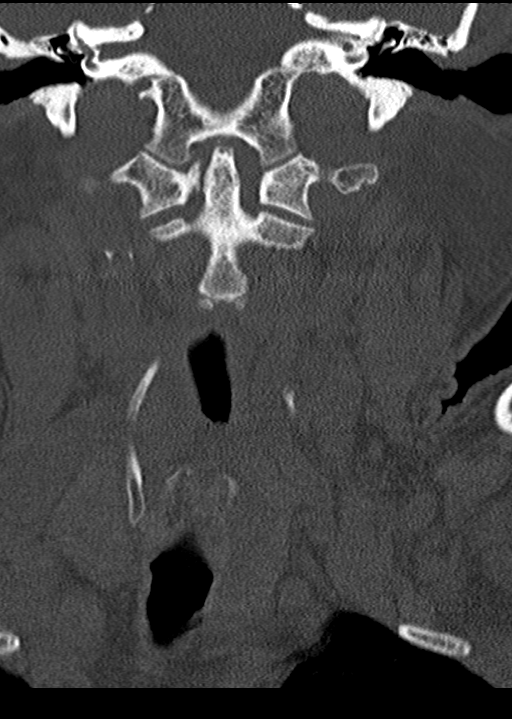
[im 27/66  bone]
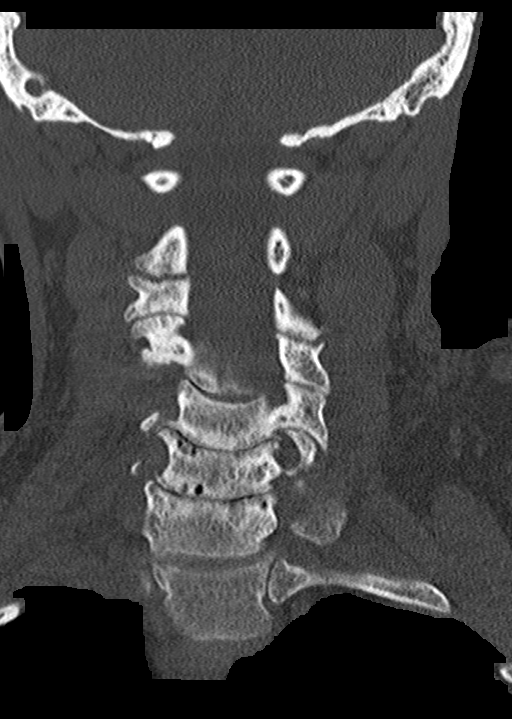
[im 40/66  bone]
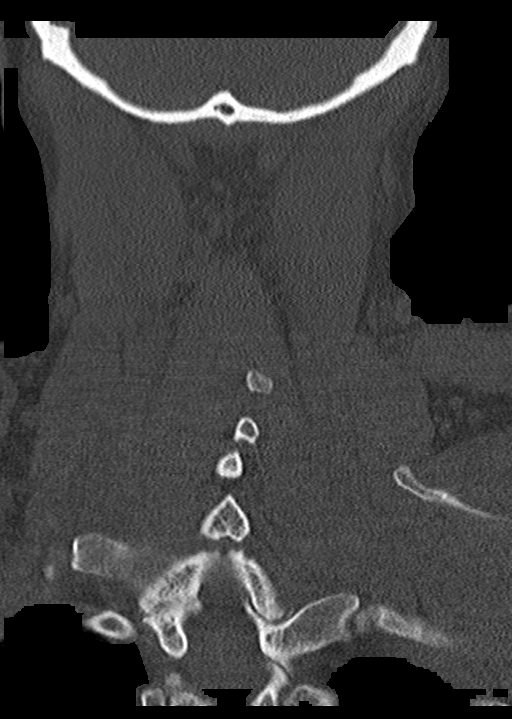

[Series 9: sagittal bone · sagittal · 0.26mm/px · 5 of 61 slices shown, 6 images]
[im 21/61  bone]
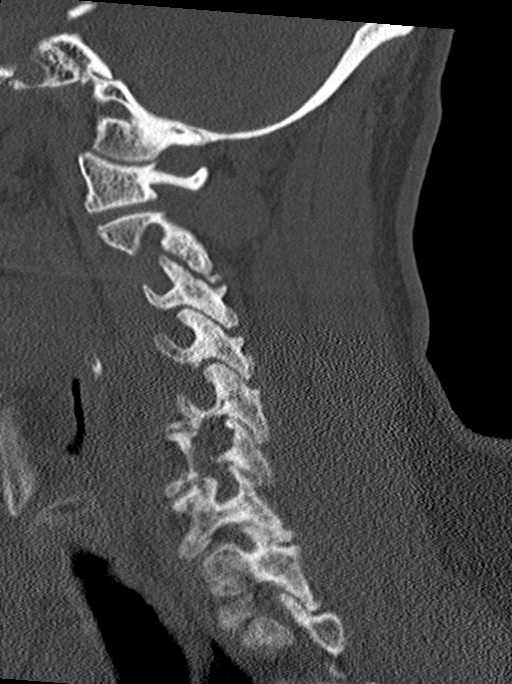
[im 26/61  bone]
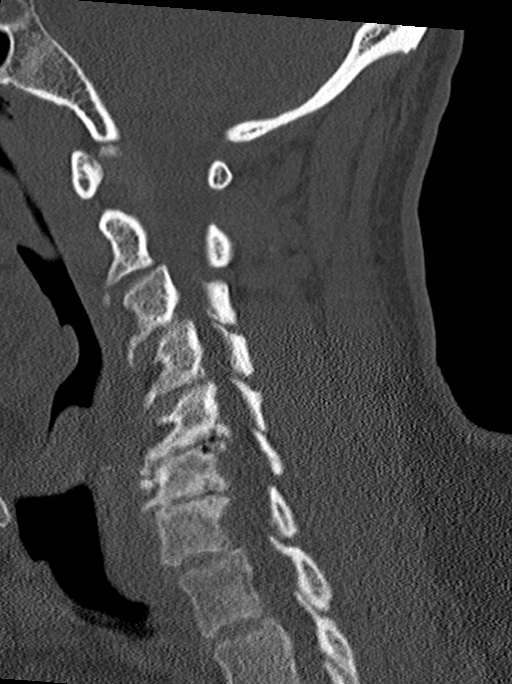
[im 31/61  soft-tissue]
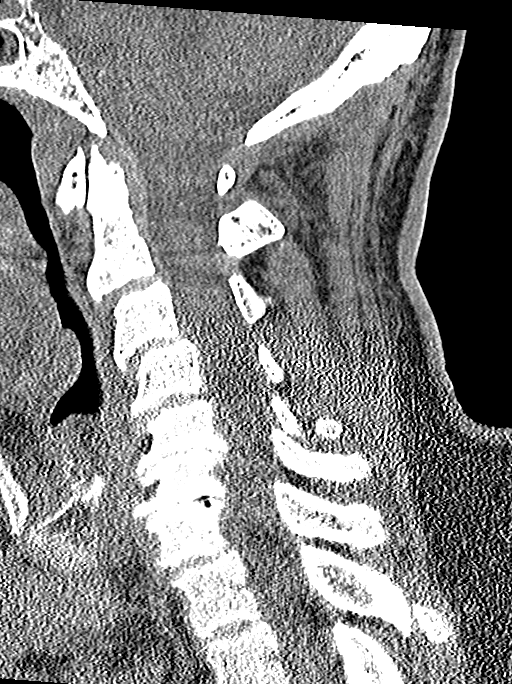
[im 31/61  bone]
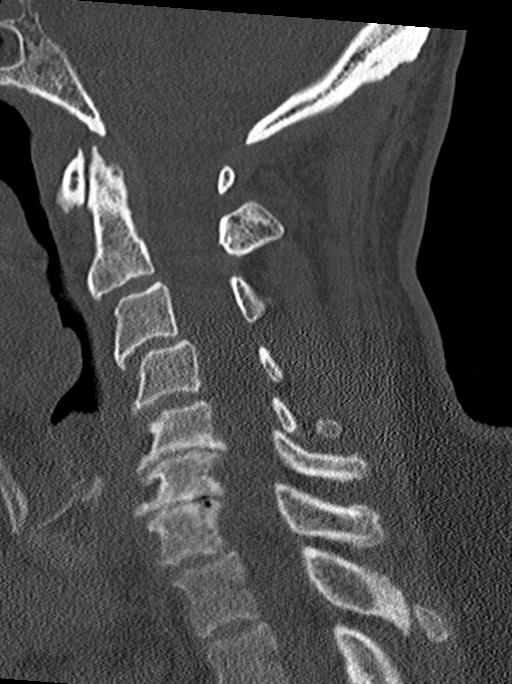
[im 36/61  bone]
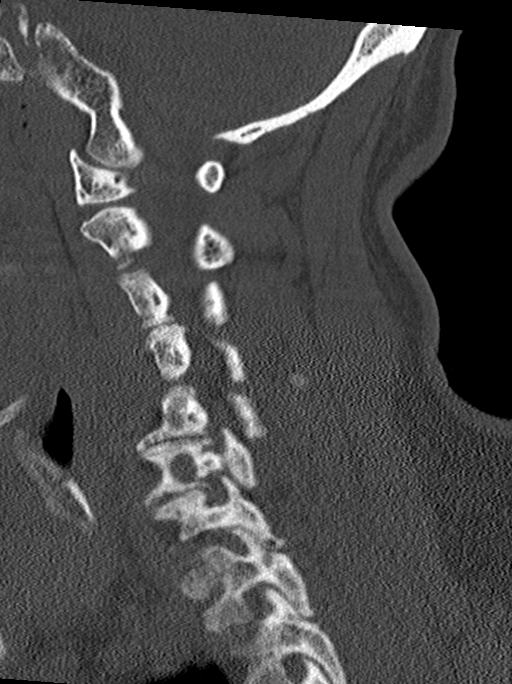
[im 41/61  bone]
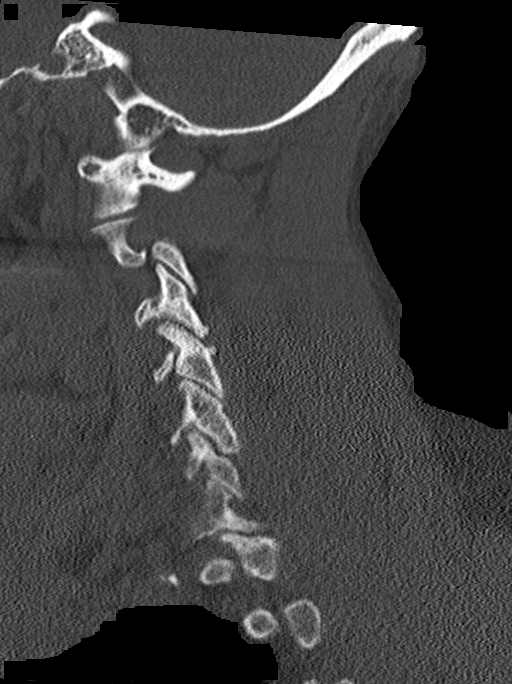

[13 of 33 positions shown; findings below may reference images not displayed]

FINDINGS: Alignment: Mild levoscoliosis. No evidence of acute vertebral body
subluxation.

Skull base and vertebrae: No fracture line or displaced fracture
fragment appreciated. Facet joints appear normally aligned
throughout.

Soft tissues and spinal canal: No prevertebral fluid or swelling. No
visible canal hematoma.

Disc levels: Degenerative spondylosis throughout the cervical spine,
most pronounced at the C5-6 through 2 C7-T1 levels with associated
disc space narrowings and osseous spurring, causing mild to moderate
central canal stenoses.

Upper chest: No acute findings.

Other: Carotid atherosclerosis.
IMPRESSION: 1. No acute findings. No fracture or acute subluxation within the
cervical spine.
2. Degenerative spondylosis throughout the cervical spine, most
prominent within the lower cervical spine, as detailed above.
3. Carotid atherosclerosis.

## 2019-05-12 ENCOUNTER — Emergency Department (HOSPITAL_COMMUNITY)
Admission: EM | Admit: 2019-05-12 | Discharge: 2019-05-13 | Disposition: A | Discharge status: Home / Self Care | Payer: Medicare Other | Attending: Emergency Medicine | Admitting: Emergency Medicine

## 2019-05-12 ENCOUNTER — Encounter (HOSPITAL_COMMUNITY): Payer: Self-pay | Admitting: Emergency Medicine

## 2019-05-12 ENCOUNTER — Other Ambulatory Visit: Payer: Self-pay

## 2019-05-12 DIAGNOSIS — B9789 Other viral agents as the cause of diseases classified elsewhere: Secondary | ICD-10-CM | POA: Diagnosis not present

## 2019-05-12 DIAGNOSIS — M549 Dorsalgia, unspecified: Secondary | ICD-10-CM | POA: Insufficient documentation

## 2019-05-12 DIAGNOSIS — R0981 Nasal congestion: Secondary | ICD-10-CM | POA: Diagnosis not present

## 2019-05-12 DIAGNOSIS — Z20828 Contact with and (suspected) exposure to other viral communicable diseases: Secondary | ICD-10-CM | POA: Diagnosis not present

## 2019-05-12 DIAGNOSIS — G8929 Other chronic pain: Secondary | ICD-10-CM | POA: Diagnosis not present

## 2019-05-12 DIAGNOSIS — Z79899 Other long term (current) drug therapy: Secondary | ICD-10-CM | POA: Insufficient documentation

## 2019-05-12 DIAGNOSIS — I10 Essential (primary) hypertension: Secondary | ICD-10-CM | POA: Diagnosis not present

## 2019-05-12 DIAGNOSIS — Z760 Encounter for issue of repeat prescription: Secondary | ICD-10-CM | POA: Diagnosis not present

## 2019-05-12 DIAGNOSIS — R5383 Other fatigue: Secondary | ICD-10-CM | POA: Diagnosis not present

## 2019-05-12 DIAGNOSIS — R509 Fever, unspecified: Secondary | ICD-10-CM | POA: Diagnosis present

## 2019-05-12 DIAGNOSIS — J45909 Unspecified asthma, uncomplicated: Secondary | ICD-10-CM | POA: Diagnosis not present

## 2019-05-12 DIAGNOSIS — J069 Acute upper respiratory infection, unspecified: Secondary | ICD-10-CM | POA: Diagnosis not present

## 2019-05-12 NOTE — ED Triage Notes (Addendum)
C/o runny nose x 2 days.  Also c/o neck and upper back pain x 3 days with history of same.  Denies fever.  Denies cough.  Reports history of HTN.

## 2019-05-13 ENCOUNTER — Telehealth: Payer: Self-pay

## 2019-05-13 ENCOUNTER — Ambulatory Visit: Payer: Medicare Other | Admitting: Internal Medicine

## 2019-05-13 MED ORDER — DICLOFENAC SODIUM 1 % TD GEL: 2.0000 g | Freq: Four times a day (QID) | TRANSDERMAL | 0 refills | Status: DC | PRN

## 2019-05-13 MED ORDER — AMLODIPINE BESYLATE 10 MG PO TABS: 10.0000 mg | ORAL_TABLET | Freq: Every day | ORAL | 0 refills | Status: DC

## 2019-05-13 NOTE — Telephone Encounter (Signed)
[place] interpreters Amy Id# [phone]  contacted pt to start his wife telephone visit and pt became upset that their visit was by telephone. I made pt aware that he was tested for covid yesterday while in the ED and those results are not back. Pt states that he went to the ED for medication refill. I informed pt that it's okay but because of safety precautions  we can not let him or his wife into the office till his covid test comes back. I informed pt that if we let him and his wife into the office and his results comes back positive then we have exposed the staff and other patients that are in office. Pt then started yelling again stating he is not sick he doesn't have covid. I informed pt that because of safety precautions we can't let him or his wife in the office. I made pt aware that we can do a televist now and when we can schedule them an in person visit next time. Pt states that is not good enough and he wants them to be seen face to face. I informed pt that it can't happen. I informed pt that we can do the televisit or we can cancel the appointments. Pt states to cancel appointment.

## 2019-05-13 NOTE — ED Provider Notes (Signed)
TIME SEEN: 12:36 AM  CHIEF COMPLAINT: Medication refill, nasal congestion  HPI: Patient is a 68 year old male with history of hypertension, asthma, chronic back pain who presents to the emergency department with 2 complaints.  He states he has had subjective fevers, felt tired, had nasal congestion and sneezing for the past day.  No fevers, chest pain or shortness of breath, vomiting or diarrhea.  No known sick contacts, recent travel or COVID-19 exposures.   Patient also complaining of chronic neck and back pain that he has had for over a year that he states is due to a herniated disc.  He denies any change in his pain but is out of his Voltaren gel and is requesting a refill.  No numbness, tingling or focal weakness.  No bowel or bladder incontinence.  No new injury to the back.  Patient states that he is here for medication refill primarily.  States that he needs refills for his amlodipine as well.  He states that he called his doctor's office for an appointment and was told that it would be done through tele visit.  He states that he felt he needed to be seen in person and did not want to be seen through a tele visit and that is why he came to the emergency department.  [place] interpreter used.   ROS: See HPI Constitutional: Subjective fever  Eyes: no drainage  ENT: no runny nose   Cardiovascular:  no chest pain  Resp: no SOB  GI: no vomiting GU: no dysuria Integumentary: no rash  Allergy: no hives  Musculoskeletal: no leg swelling  Neurological: no slurred speech ROS otherwise negative  PAST MEDICAL HISTORY/PAST SURGICAL HISTORY:  Past Medical History:  Diagnosis Date  . Asthma   . Chronic back pain   . Hypertension   . Sciatica     MEDICATIONS:  Prior to Admission medications   Medication Sig Start Date End Date Taking? Authorizing Provider  albuterol (PROVENTIL HFA;VENTOLIN HFA) 108 (90 Base) MCG/ACT inhaler Inhale 1-2 puffs into the lungs every 6 (six) hours as needed  for wheezing or shortness of breath. Patient not taking: Reported on 11/16/2018 06/28/18   Elpidio Anis, PA-C  albuterol (VENTOLIN HFA) 108 (90 Base) MCG/ACT inhaler Inhale 1-2 puffs into the lungs every 6 (six) hours as needed for wheezing or shortness of breath. 11/01/18   Rancour, Jeannett Senior, MD  amLODipine (NORVASC) 10 MG tablet Take 1 tablet (10 mg total) by mouth daily. 07/09/18   Anders Simmonds, PA-C  fluticasone (FLONASE) 50 MCG/ACT nasal spray Place 2 sprays into both nostrils daily. Patient not taking: Reported on 11/16/2018 10/19/18   Marcine Matar, MD  Guaifenesin 1200 MG TB12 Take 1 tablet (1,200 mg total) by mouth 2 (two) times daily. Patient not taking: Reported on 11/16/2018 11/08/18   Charlestine Night, PA-C  loratadine (CLARITIN) 10 MG tablet Take 1 tablet (10 mg total) by mouth daily. Patient not taking: Reported on 11/16/2018 10/19/18   Marcine Matar, MD  traMADol (ULTRAM) 50 MG tablet Take 1 tablet (50 mg total) by mouth every 8 (eight) hours as needed. Patient not taking: Reported on 11/16/2018 10/19/18   Marcine Matar, MD    ALLERGIES:  No Known Allergies  SOCIAL HISTORY:  Social History   Tobacco Use  . Smoking status: Never Smoker  . Smokeless tobacco: Never Used  Substance Use Topics  . Alcohol use: No    Alcohol/week: 0.0 standard drinks    FAMILY HISTORY: Family History  Problem Relation Age of Onset  . Hyperlipidemia Father   . Cancer Brother   . Diabetes Paternal Uncle     EXAM: BP (!) 183/97 (BP Location: Right Arm)   Pulse 78   Temp 98.4 F (36.9 C) (Oral)   Resp 16   SpO2 96%  CONSTITUTIONAL: Alert and oriented and responds appropriately to questions. Well-appearing; well-nourished HEAD: Normocephalic EYES: Conjunctivae clear, pupils appear equal, EOMI ENT: normal nose; moist mucous membranes NECK: Supple, no meningismus, no nuchal rigidity, no LAD  CARD: RRR; S1 and S2 appreciated; no murmurs, no clicks, no rubs, no  gallops RESP: Normal chest excursion without splinting or tachypnea; breath sounds clear and equal bilaterally; no wheezes, no rhonchi, no rales, no hypoxia or respiratory distress, speaking full sentences ABD/GI: Normal bowel sounds; non-distended; soft, non-tender, no rebound, no guarding, no peritoneal signs, no hepatosplenomegaly BACK:  The back appears normal and is tender throughout the paraspinal muscles throughout the back, no midline spinal tenderness, step-off or deformity EXT: Normal ROM in all joints; non-tender to palpation; no edema; normal capillary refill; no cyanosis, no calf tenderness or swelling    SKIN: Normal color for age and race; warm; no rash NEURO: Moves all extremities equally, normal sensation diffusely, normal gait, no clonus, no saddle anesthesia, normal speech PSYCH: The patient's mood and manner are appropriate. Grooming and personal hygiene are appropriate.  MEDICAL DECISION MAKING: Patient here with complaints of upper respiratory infection symptoms likely viral in nature.  No cough, chest pain or shortness of breath.  No hypoxia.  Offered COVID-19 testing which he agrees to today.  He is extremely well-appearing.  I do not feel he needs further emergent work-up.  Discussed with patient's how he can follow-up on these results in my chart have recommended quarantine until he has had a negative test.  I do not feel he needs antibiotics.  Low suspicion for pneumonia.  Patient also requesting medication refills of his amlodipine and Voltaren gel.  He is requesting that I give him several months worth with refills.  Discussed with patient that I am happy to provide him with 1 month supply of his medications but that he will need to follow-up with Dr. Wynetta Emery for further refills in the future.  No new focal neurologic deficits or red flag symptoms concerning for his back pain.  He states his back pain is chronic and unchanged.  I do not feel he needs emergent imaging.  Doubt  fracture, cauda equina, epidural abscess or hematoma, discitis or osteomyelitis, transverse myelitis.  At this time, I do not feel there is any life-threatening condition present. I have reviewed, interpreted and discussed all results (EKG, imaging, lab, urine as appropriate) and exam findings with patient/family. I have reviewed nursing notes and appropriate previous records.  I feel the patient is safe to be discharged home without further emergent workup and can continue workup as an outpatient as needed. Discussed usual and customary return precautions. Patient/family verbalize understanding and are comfortable with this plan.  Outpatient follow-up has been provided as needed. All questions have been answered.   Tyrone Mcdonald was evaluated in Emergency Department on 05/13/2019 for the symptoms described in the history of present illness. He was evaluated in the context of the global COVID-19 pandemic, which necessitated consideration that the patient might be at risk for infection with the SARS-CoV-2 virus that causes COVID-19. Institutional protocols and algorithms that pertain to the evaluation of patients at risk for COVID-19 are in a state  of rapid change based on information released by regulatory bodies including the CDC and federal and state organizations. These policies and algorithms were followed during the patient's care in the ED.    Yaslin Kirtley, Layla Maw, DO 05/13/19 [phone]

## 2019-05-13 NOTE — Discharge Instructions (Signed)
We have tested you for COVID-19.  You may follow-up on your test results in my chart in 2 to 3 days.  I recommend that you quarantine until you have received your test results back.  You will need to follow-up with your primary care physician Dr. Wynetta Emery for further refills of your blood pressure medication and medication for your chronic back pain.

## 2019-05-14 LAB — NOVEL CORONAVIRUS, NAA (HOSP ORDER, SEND-OUT TO REF LAB; TAT 18-24 HRS): SARS-CoV-2, NAA: NOT DETECTED

## 2019-05-26 DIAGNOSIS — I10 Essential (primary) hypertension: Secondary | ICD-10-CM | POA: Diagnosis not present

## 2019-05-26 DIAGNOSIS — M5090 Cervical disc disorder, unspecified, unspecified cervical region: Secondary | ICD-10-CM | POA: Diagnosis not present

## 2019-05-26 DIAGNOSIS — J45909 Unspecified asthma, uncomplicated: Secondary | ICD-10-CM | POA: Diagnosis not present

## 2019-06-02 DIAGNOSIS — Z23 Encounter for immunization: Secondary | ICD-10-CM | POA: Diagnosis not present

## 2019-06-07 ENCOUNTER — Ambulatory Visit: Payer: Medicare Other | Admitting: Pharmacist

## 2019-06-18 ENCOUNTER — Ambulatory Visit: Payer: Medicare Other | Admitting: Internal Medicine

## 2019-08-18 ENCOUNTER — Emergency Department (HOSPITAL_COMMUNITY): Payer: Medicare Other

## 2019-08-18 ENCOUNTER — Encounter (HOSPITAL_COMMUNITY): Payer: Self-pay | Admitting: Emergency Medicine

## 2019-08-18 ENCOUNTER — Other Ambulatory Visit: Payer: Self-pay

## 2019-08-18 ENCOUNTER — Emergency Department (HOSPITAL_COMMUNITY)
Admission: EM | Admit: 2019-08-18 | Discharge: 2019-08-18 | Disposition: A | Discharge status: Home / Self Care | Payer: Medicare Other | Attending: Emergency Medicine | Admitting: Emergency Medicine

## 2019-08-18 DIAGNOSIS — G8929 Other chronic pain: Secondary | ICD-10-CM | POA: Diagnosis not present

## 2019-08-18 DIAGNOSIS — N183 Chronic kidney disease, stage 3 unspecified: Secondary | ICD-10-CM | POA: Insufficient documentation

## 2019-08-18 DIAGNOSIS — J4520 Mild intermittent asthma, uncomplicated: Secondary | ICD-10-CM | POA: Diagnosis not present

## 2019-08-18 DIAGNOSIS — Z20822 Contact with and (suspected) exposure to covid-19: Secondary | ICD-10-CM | POA: Diagnosis not present

## 2019-08-18 DIAGNOSIS — M542 Cervicalgia: Secondary | ICD-10-CM | POA: Insufficient documentation

## 2019-08-18 DIAGNOSIS — I129 Hypertensive chronic kidney disease with stage 1 through stage 4 chronic kidney disease, or unspecified chronic kidney disease: Secondary | ICD-10-CM | POA: Diagnosis not present

## 2019-08-18 DIAGNOSIS — R062 Wheezing: Secondary | ICD-10-CM | POA: Diagnosis present

## 2019-08-18 MED ORDER — LIDOCAINE 5 % EX PTCH: 1.0000 | MEDICATED_PATCH | CUTANEOUS | 0 refills | Status: DC

## 2019-08-18 MED ORDER — DICLOFENAC SODIUM 1 % EX GEL: 4.0000 g | Freq: Four times a day (QID) | CUTANEOUS | 2 refills | Status: DC

## 2019-08-18 MED ORDER — ALBUTEROL SULFATE HFA 108 (90 BASE) MCG/ACT IN AERS: 1.0000 | INHALATION_SPRAY | Freq: Four times a day (QID) | RESPIRATORY_TRACT | 2 refills | Status: DC | PRN

## 2019-08-18 MED ORDER — ALBUTEROL SULFATE HFA 108 (90 BASE) MCG/ACT IN AERS
2.0000 | INHALATION_SPRAY | Freq: Once | RESPIRATORY_TRACT | Status: AC
  Administered 2019-08-18: 2 via RESPIRATORY_TRACT
  Filled 2019-08-18: qty 6.7

## 2019-08-18 MED ORDER — PREDNISONE 10 MG PO TABS: 40.0000 mg | ORAL_TABLET | Freq: Every day | ORAL | 0 refills | Status: AC

## 2019-08-18 NOTE — Discharge Instructions (Addendum)
  General Viral Syndrome Care Instructions:  Your symptoms are likely consistent with a viral illness. Viruses do not require or respond to antibiotics. Treatment is symptomatic care and it is important to note that these symptoms may last for 7-14 days.   Hand washing: Wash your hands throughout the day, but especially before and after touching the face, using the restroom, sneezing, coughing, or touching surfaces that have been coughed or sneezed upon. Hydration: Symptoms of most illnesses will be intensified and complicated by dehydration. Dehydration can also extend the duration of symptoms. Drink plenty of fluids and get plenty of rest. You should be drinking at least half a liter of water an hour to stay hydrated. Electrolyte drinks (ex. Gatorade, Powerade, Pedialyte) are also encouraged. You should be drinking enough fluids to make your urine light yellow, almost clear. If this is not the case, you are not drinking enough water. Please note that some of the treatments indicated below will not be effective if you are not adequately hydrated. Diet: Please concentrate on hydration, however, you may introduce food slowly.  Start with a clear liquid diet, progressed to a full liquid diet, and then bland solids as you are able. Pain or fever: Ibuprofen, Naproxen, or acetaminophen (generic for Tylenol) for pain or fever.  Antiinflammatory medications: Take 600 mg of ibuprofen every 6 hours or 440 mg (over the counter dose) to 500 mg (prescription dose) of naproxen every 12 hours for the next 3 days. After this time, these medications may be used as needed for pain. Take these medications with food to avoid upset stomach. Choose only one of these medications, do not take them together. Acetaminophen (generic for Tylenol): Should you continue to have additional pain while taking the ibuprofen or naproxen, you may add in acetaminophen as needed. Your daily total maximum amount of acetaminophen from all  sources should be limited to 4000mg /day for persons without liver problems, or 2000mg /day for those with liver problems. Diclofenac gel: This is a topical anti-inflammatory medication and can be applied directly to the painful region.  Do not use on the face or genitals.  This medication may be used as an alternative to oral anti-inflammatory medications, such as ibuprofen or naproxen. Lidocaine patches: These are available via either prescription or over-the-counter. The over-the-counter option may be more economical one and are likely just as effective. There are multiple over-the-counter brands, such as Salonpas. Albuterol: May use the albuterol as needed for instances of shortness of breath. Prednisone: Take the prednisone, as directed, in its entirety. Zyrtec or Claritin: May add these medication daily to control underlying symptoms of congestion, sneezing, and other signs of allergies.  These medications are available over-the-counter. Generics: Cetirizine (generic for Zyrtec) and loratadine (generic for Claritin). Fluticasone: Use fluticasone (generic for Flonase), as directed, for nasal and sinus congestion.  This medication is available over-the-counter. Congestion: Plain guaifenesin (generic for plain Mucinex) may help relieve congestion. Saline sinus rinses and saline nasal sprays may also help relieve congestion.  Follow up: Follow up with a primary care provider within the next two weeks. Return: Return to the ED for significantly worsening symptoms, shortness of breath, persistent vomiting, large amounts of blood in stool, or any other major concerns.  For prescription assistance, may try using prescription discount sites or apps, such as goodrx.com

## 2019-08-18 NOTE — ED Notes (Signed)
Pt given d/c papers, all questions answered. Pt getting dressed.

## 2019-08-18 NOTE — ED Triage Notes (Signed)
Patient reports asthma attack onset yesterday with wheezing , denies cough or fever.

## 2019-08-18 NOTE — ED Provider Notes (Signed)
[facility] EMERGENCY DEPARTMENT Provider Note   CSN: [id number] Arrival date & time: 08/18/19  0544     History Chief Complaint  Patient presents with  . Asthma    Tyrone Mcdonald is a 69 y.o. male.  The history is provided by the patient. A language interpreter was used (Arabic).        Tyrone Mcdonald is a 69 y.o. male, with a history of asthma, chronic back pain, HTN, CKD, presenting to the ED with concern for asthma exacerbation yesterday.  He states he began wheezing and having shortness of breath, used his albuterol inhaler, and symptoms resolved.  However, he used the last of his albuterol inhaler yesterday and arrives today seeking a replacement inhaler.  He also complains of chronic pain in his neck, back, and both knees.  He states this pain has been going on for years.  The pain is achy, moderate to severe, nonradiating from these locations.  His pain is no different than it has been in the past.   Denies fever/chills, cough, current shortness of breath, chest pain, abdominal pain, N/V/D, urinary symptoms, numbness, weakness, falls/trauma, or any other complaints.   Past Medical History:  Diagnosis Date  . Asthma   . Chronic back pain   . Hypertension   . Sciatica     Patient Active Problem List   Diagnosis Date Noted  . Osteoarthritis of both knees 05/23/2017  . CKD (chronic kidney disease) stage 3, GFR 30-59 ml/min 05/23/2017  . Positive depression screening 05/23/2017  . Benign prostatic hyperplasia with urinary frequency 03/28/2017  . Lumbar radiculopathy 03/28/2017  . Chronic neck pain 03/28/2017  . Osteoarthritis of spine with radiculopathy, lumbar region 02/28/2016  . Language barrier affecting health care 02/28/2016  . Abnormal PSA 02/28/2016  . Essential hypertension 07/25/2014  . Family history of diabetes mellitus (DM) 07/25/2014  . Rhinitis, allergic 07/25/2014    History reviewed. No pertinent  surgical history.     Family History  Problem Relation Age of Onset  . Hyperlipidemia Father   . Cancer Brother   . Diabetes Paternal Uncle     Social History   Tobacco Use  . Smoking status: Never Smoker  . Smokeless tobacco: Never Used  Substance Use Topics  . Alcohol use: No    Alcohol/week: 0.0 standard drinks  . Drug use: No    Home Medications Prior to Admission medications   Medication Sig Start Date End Date Taking? Authorizing Provider  albuterol (VENTOLIN HFA) 108 (90 Base) MCG/ACT inhaler Inhale 1-2 puffs into the lungs every 6 (six) hours as needed for wheezing or shortness of breath. 11/01/18   Rancour, Jeannett Senior, MD  albuterol (VENTOLIN HFA) 108 (90 Base) MCG/ACT inhaler Inhale 1-2 puffs into the lungs every 6 (six) hours as needed for wheezing or shortness of breath. 08/18/19   Adahlia Stembridge C, PA-C  amLODipine (NORVASC) 10 MG tablet Take 1 tablet (10 mg total) by mouth daily. 05/13/19   Ward, Layla Maw, DO  diclofenac Sodium (VOLTAREN) 1 % GEL Apply 4 g topically 4 (four) times daily. 08/18/19   Hermena Swint C, PA-C  fluticasone (FLONASE) 50 MCG/ACT nasal spray Place 2 sprays into both nostrils daily. Patient not taking: Reported on 11/16/2018 10/19/18   Marcine Matar, MD  Guaifenesin 1200 MG TB12 Take 1 tablet (1,200 mg total) by mouth 2 (two) times daily. Patient not taking: Reported on 11/16/2018 11/08/18   Charlestine Night, PA-C  lidocaine (LIDODERM) 5 %  Place 1 patch onto the skin daily. Remove & Discard patch within 12 hours or as directed by MD 08/18/19   Tylar Merendino C, PA-C  loratadine (CLARITIN) 10 MG tablet Take 1 tablet (10 mg total) by mouth daily. Patient not taking: Reported on 11/16/2018 10/19/18   Marcine Matar, MD  predniSONE (DELTASONE) 10 MG tablet Take 4 tablets (40 mg total) by mouth daily with breakfast for 4 days. 08/18/19 08/22/19  Aldina Porta C, PA-C  traMADol (ULTRAM) 50 MG tablet Take 1 tablet (50 mg total) by mouth every 8 (eight) hours as  needed. Patient not taking: Reported on 11/16/2018 10/19/18   Marcine Matar, MD    Allergies    Patient has no known allergies.  Review of Systems   Review of Systems  Constitutional: Negative for chills, diaphoresis and fever.  Respiratory: Positive for shortness of breath (resolved). Negative for cough.   Cardiovascular: Negative for chest pain and leg swelling.  Gastrointestinal: Negative for abdominal pain, diarrhea, nausea and vomiting.  Musculoskeletal: Positive for arthralgias, back pain and neck pain.  Neurological: Negative for dizziness, syncope, weakness and numbness.  All other systems reviewed and are negative.   Physical Exam Updated Vital Signs BP (!) 163/94 (BP Location: Right Arm)   Pulse (!) 101   Temp 98.3 F (36.8 C) (Oral)   Resp (!) 22   SpO2 99%   Physical Exam Vitals and nursing note reviewed.  Constitutional:      General: He is not in acute distress.    Appearance: He is well-developed. He is not diaphoretic.  HENT:     Head: Normocephalic and atraumatic.     Mouth/Throat:     Mouth: Mucous membranes are moist.     Pharynx: Oropharynx is clear.  Eyes:     Conjunctiva/sclera: Conjunctivae normal.  Neck:   Cardiovascular:     Rate and Rhythm: Normal rate and regular rhythm.     Pulses: Normal pulses.          Radial pulses are 2+ on the right side and 2+ on the left side.       Posterior tibial pulses are 2+ on the right side and 2+ on the left side.     Heart sounds: Normal heart sounds.     Comments: Tactile temperature in the extremities appropriate and equal bilaterally. Pulmonary:     Effort: Pulmonary effort is normal. No respiratory distress.     Breath sounds: Normal breath sounds.     Comments: No increased work of breathing.  Speaks in full sentences without difficulty.  Maintains SPO2 98 to 100%, even while moving or speaking. Abdominal:     Palpations: Abdomen is soft.     Tenderness: There is no abdominal tenderness. There  is no guarding.  Musculoskeletal:     Cervical back: Neck supple.       Back:     Right knee: No deformity.     Left knee: No deformity.     Right lower leg: No edema.     Left lower leg: No edema.     Comments: Patient has some tenderness throughout his knees.  He has full range of motion in the knees.  No color change, deformity, or instability noted.  Normal motor function intact in all extremities. No midline spinal tenderness.   Lymphadenopathy:     Cervical: No cervical adenopathy.  Skin:    General: Skin is warm and dry.  Neurological:     Mental  Status: He is alert.     Comments: No noted acute cognitive deficit. Sensation grossly intact to light touch in the extremities.   Grip strengths equal bilaterally.   Strength 5/5 in all extremities.  Patient ambulates independently with the use of a cane, which he states is his baseline. Coordination intact.  Cranial nerves III-XII grossly intact.  Handles oral secretions without noted difficulty.  No noted phonation or speech deficit. No facial droop.   Psychiatric:        Mood and Affect: Mood and affect normal.        Speech: Speech normal.        Behavior: Behavior normal.     ED Results / Procedures / Treatments   Labs (all labs ordered are listed, but only abnormal results are displayed) Labs Reviewed  NOVEL CORONAVIRUS, NAA (HOSP ORDER, SEND-OUT TO REF LAB; TAT 18-24 HRS)    EKG EKG Interpretation  Date/Time:  Wednesday August 18 2019 08:46:12 EST Ventricular Rate:  97 PR Interval:    QRS Duration: 83 QT Interval:  365 QTC Calculation: 464 R Axis:   50 Text Interpretation: Age not entered, assumed to be  69 years old for purpose of ECG interpretation Sinus rhythm Abnormal R-wave progression, early transition Borderline repolarization abnormality Confirmed by Davonna Belling [id number]) on 08/18/2019 9:02:15 AM   Radiology DG Chest Portable 1 View  Result Date: 08/18/2019 CLINICAL DATA:  Asthma attack  yesterday.  Shortness of breath. EXAM: PORTABLE CHEST 1 VIEW COMPARISON:  11/16/2018 FINDINGS: Normal heart size and mediastinal contours. No acute infiltrate or edema. No effusion or pneumothorax. Spondylitic endplate spurring. No acute osseous findings. IMPRESSION: Negative chest. Electronically Signed   By: Monte Fantasia M.D.   On: 08/18/2019 07:37    Procedures Procedures (including critical care time)  Medications Ordered in ED Medications  albuterol (VENTOLIN HFA) 108 (90 Base) MCG/ACT inhaler 2 puff (2 puffs Inhalation Given 08/18/19 0554)    ED Course  I have reviewed the triage vital signs and the nursing notes.  Pertinent labs & imaging results that were available during my care of the patient were reviewed by me and considered in my medical decision making (see chart for details).    MDM Rules/Calculators/A&P                      Patient presents complaining of possible asthma exacerbation yesterday.  This resolved with albuterol inhaler. Patient is nontoxic appearing, afebrile, not tachycardic, not tachypneic, not hypotensive, maintains excellent SPO2 on room air, and is in no apparent distress.  Patient was given an albuterol inhaler here in the ED.  He spent most of the visit complaining of his chronic pain in his neck, back, and knees that he states has been present for years. We discussed PCP and orthopedic follow-up. The patient was given instructions for home care as well as return precautions. Patient voices understanding of these instructions, accepts the plan, and is comfortable with discharge.  Findings and plan of care discussed with Delphina Cahill, MD.   Vitals:   08/18/19 0549 08/18/19 0800 08/18/19 0849  BP: (!) 163/94  (!) 150/87  Pulse: (!) 101 83 98  Resp: (!) 22  (!) 25  Temp: 98.3 F (36.8 C)    TempSrc: Oral    SpO2: 99% 98% 100%    Final Clinical Impression(s) / ED Diagnoses Final diagnoses:  Mild intermittent asthma without complication     Rx / DC Orders ED Discharge Orders  Ordered    albuterol (VENTOLIN HFA) 108 (90 Base) MCG/ACT inhaler  Every 6 hours PRN,   Status:  Discontinued     08/18/19 0816    predniSONE (DELTASONE) 10 MG tablet  Daily with breakfast     08/18/19 0816    diclofenac Sodium (VOLTAREN) 1 % GEL  4 times daily     08/18/19 0817    albuterol (VENTOLIN HFA) 108 (90 Base) MCG/ACT inhaler  Every 6 hours PRN     08/18/19 0831    lidocaine (LIDODERM) 5 %  Every 24 hours     08/18/19 0855           Anselm Pancoast, PA-C 08/18/19 3614    Benjiman Core, MD 08/18/19 [phone]

## 2019-08-19 LAB — NOVEL CORONAVIRUS, NAA (HOSP ORDER, SEND-OUT TO REF LAB; TAT 18-24 HRS): SARS-CoV-2, NAA: NOT DETECTED

## 2019-08-28 ENCOUNTER — Ambulatory Visit: Payer: Medicare Other | Attending: Internal Medicine

## 2019-08-28 DIAGNOSIS — Z23 Encounter for immunization: Secondary | ICD-10-CM | POA: Insufficient documentation

## 2019-08-28 NOTE — Progress Notes (Signed)
   Covid-19 Vaccination Clinic  Name:  Tyrone Mcdonald    MRN: [id number] DOB: 14-Aug-1950  08/28/2019  Mr. Tyrone Mcdonald was observed post Covid-19 immunization for 15 minutes without incidence. He was provided with Vaccine Information Sheet and instruction to access the V-Safe system.   Mr. Tyrone Mcdonald was instructed to call 911 with any severe reactions post vaccine: Marland Kitchen Difficulty breathing  . Swelling of your face and throat  . A fast heartbeat  . A bad rash all over your body  . Dizziness and weakness    Immunizations Administered    Name Date Dose VIS Date Route   Pfizer COVID-19 Vaccine 08/28/2019 10:28 AM 0.3 mL 06/18/2019 Intramuscular   Manufacturer: [employer], [employer]   Lot: [id number]   NDC: [address]-7

## 2019-09-21 ENCOUNTER — Ambulatory Visit: Payer: Medicare Other | Attending: Internal Medicine

## 2019-09-21 DIAGNOSIS — Z23 Encounter for immunization: Secondary | ICD-10-CM

## 2019-09-21 NOTE — Progress Notes (Signed)
   Covid-19 Vaccination Clinic  Name:  Tyrone Mcdonald    MRN: [id number] DOB: [birth year]  09/21/2019  [name] was observed post Covid-19 immunization for 15 minutes without incident. He was provided with Vaccine Information Sheet and instruction to access the V-Safe system.   [name] was instructed to call 911 with any severe reactions post vaccine: Marland Kitchen Difficulty breathing  . Swelling of face and throat  . A fast heartbeat  . A bad rash all over body  . Dizziness and weakness   Immunizations Administered    Name Date Dose VIS Date Route   Pfizer COVID-19 Vaccine 09/21/2019 10:22 AM 0.3 mL 06/18/2019 Intramuscular   Manufacturer: [employer], [employer]   Lot: [id number]   NDC: [address]-9

## 2019-09-24 ENCOUNTER — Ambulatory Visit: Payer: Medicare Other | Admitting: Nurse Practitioner

## 2020-01-06 ENCOUNTER — Emergency Department (HOSPITAL_COMMUNITY): Payer: Medicare Other

## 2020-01-06 ENCOUNTER — Emergency Department (HOSPITAL_COMMUNITY)
Admission: EM | Admit: 2020-01-06 | Discharge: 2020-01-06 | Disposition: A | Discharge status: Home / Self Care | Payer: Medicare Other | Attending: Emergency Medicine | Admitting: Emergency Medicine

## 2020-01-06 ENCOUNTER — Encounter (HOSPITAL_COMMUNITY): Payer: Self-pay | Admitting: Emergency Medicine

## 2020-01-06 DIAGNOSIS — I129 Hypertensive chronic kidney disease with stage 1 through stage 4 chronic kidney disease, or unspecified chronic kidney disease: Secondary | ICD-10-CM | POA: Insufficient documentation

## 2020-01-06 DIAGNOSIS — M79671 Pain in right foot: Secondary | ICD-10-CM

## 2020-01-06 DIAGNOSIS — Z79899 Other long term (current) drug therapy: Secondary | ICD-10-CM | POA: Insufficient documentation

## 2020-01-06 DIAGNOSIS — M10071 Idiopathic gout, right ankle and foot: Secondary | ICD-10-CM | POA: Insufficient documentation

## 2020-01-06 DIAGNOSIS — N183 Chronic kidney disease, stage 3 unspecified: Secondary | ICD-10-CM | POA: Diagnosis not present

## 2020-01-06 MED ORDER — ACETAMINOPHEN 500 MG PO TABS
1000.0000 mg | ORAL_TABLET | Freq: Once | ORAL | Status: AC
  Administered 2020-01-06: 1000 mg via ORAL
  Filled 2020-01-06: qty 2

## 2020-01-06 MED ORDER — PREDNISONE 20 MG PO TABS: ORAL_TABLET | ORAL | 0 refills | Status: DC

## 2020-01-06 NOTE — ED Provider Notes (Signed)
[facility] EMERGENCY DEPARTMENT Provider Note   CSN: [id number] Arrival date & time: 01/06/20  0623     History Chief Complaint  Patient presents with  . Foot Pain    Tyrone Mcdonald is a 69 y.o. male.  Patient with history of chronic kidney disease, osteoarthritis, high blood pressure, speaks Arabic primarily [place] second presents with right midfoot pain for the past 2 days.  No injury recalled.  Pain constant.  No fevers chills or infectious symptoms.  Possibly he has had gout before.  Patient has a cane.        Past Medical History:  Diagnosis Date  . Asthma   . Chronic back pain   . Hypertension   . Sciatica     Patient Active Problem List   Diagnosis Date Noted  . Osteoarthritis of both knees 05/23/2017  . CKD (chronic kidney disease) stage 3, GFR 30-59 ml/min 05/23/2017  . Positive depression screening 05/23/2017  . Benign prostatic hyperplasia with urinary frequency 03/28/2017  . Lumbar radiculopathy 03/28/2017  . Chronic neck pain 03/28/2017  . Osteoarthritis of spine with radiculopathy, lumbar region 02/28/2016  . Language barrier affecting health care 02/28/2016  . Abnormal PSA 02/28/2016  . Essential hypertension 07/25/2014  . Family history of diabetes mellitus (DM) 07/25/2014  . Rhinitis, allergic 07/25/2014    History reviewed. No pertinent surgical history.     Family History  Problem Relation Age of Onset  . Hyperlipidemia Father   . Cancer Brother   . Diabetes Paternal Uncle     Social History   Tobacco Use  . Smoking status: Never Smoker  . Smokeless tobacco: Never Used  Vaping Use  . Vaping Use: Never used  Substance Use Topics  . Alcohol use: No    Alcohol/week: 0.0 standard drinks  . Drug use: No    Home Medications Prior to Admission medications   Medication Sig Start Date End Date Taking? Authorizing Provider  albuterol (VENTOLIN HFA) 108 (90 Base) MCG/ACT inhaler Inhale 1-2 puffs into the  lungs every 6 (six) hours as needed for wheezing or shortness of breath. 11/01/18   Rancour, Jeannett Senior, MD  albuterol (VENTOLIN HFA) 108 (90 Base) MCG/ACT inhaler Inhale 1-2 puffs into the lungs every 6 (six) hours as needed for wheezing or shortness of breath. 08/18/19   Joy, Shawn C, PA-C  amLODipine (NORVASC) 10 MG tablet Take 1 tablet (10 mg total) by mouth daily. 05/13/19   Ward, Layla Maw, DO  diclofenac Sodium (VOLTAREN) 1 % GEL Apply 4 g topically 4 (four) times daily. 08/18/19   Joy, Shawn C, PA-C  fluticasone (FLONASE) 50 MCG/ACT nasal spray Place 2 sprays into both nostrils daily. Patient not taking: Reported on 11/16/2018 10/19/18   Marcine Matar, MD  Guaifenesin 1200 MG TB12 Take 1 tablet (1,200 mg total) by mouth 2 (two) times daily. Patient not taking: Reported on 11/16/2018 11/08/18   Charlestine Night, PA-C  lidocaine (LIDODERM) 5 % Place 1 patch onto the skin daily. Remove & Discard patch within 12 hours or as directed by MD 08/18/19   Joy, Shawn C, PA-C  loratadine (CLARITIN) 10 MG tablet Take 1 tablet (10 mg total) by mouth daily. Patient not taking: Reported on 11/16/2018 10/19/18   Marcine Matar, MD  traMADol (ULTRAM) 50 MG tablet Take 1 tablet (50 mg total) by mouth every 8 (eight) hours as needed. Patient not taking: Reported on 11/16/2018 10/19/18   Marcine Matar, MD  Allergies    Patient has no known allergies.  Review of Systems   Review of Systems  Constitutional: Negative for chills and fever.  Respiratory: Negative for shortness of breath.   Cardiovascular: Negative for chest pain.  Gastrointestinal: Negative for abdominal pain and vomiting.  Musculoskeletal: Positive for gait problem and joint swelling. Negative for back pain, neck pain and neck stiffness.  Skin: Negative for rash.  Neurological: Negative for light-headedness and headaches.    Physical Exam Updated Vital Signs BP (!) 155/87 (BP Location: Left Arm)   Pulse 96   Temp 98.3 F (36.8 C)  (Oral)   Resp 16   SpO2 98%   Physical Exam Vitals and nursing note reviewed.  Constitutional:      Appearance: He is well-developed.  HENT:     Head: Normocephalic.  Eyes:     General:        Right eye: No discharge.        Left eye: No discharge.  Neck:     Trachea: No tracheal deviation.  Cardiovascular:     Rate and Rhythm: Normal rate.  Pulmonary:     Effort: Pulmonary effort is normal.  Musculoskeletal:        General: Swelling and tenderness present. No deformity.     Cervical back: Normal range of motion.     Comments: Patient has mild tenderness dorsal aspect of right midfoot no external signs of infection, minimal swelling, no focal tenderness to the great toe, neurovascularly intact right foot, no ankle tenderness or swelling.  Skin:    General: Skin is warm.     Findings: No rash.  Neurological:     Mental Status: He is alert and oriented to person, place, and time.     ED Results / Procedures / Treatments   Labs (all labs ordered are listed, but only abnormal results are displayed) Labs Reviewed - No data to display  EKG None  Radiology DG Foot Complete Right  Result Date: 01/06/2020 CLINICAL DATA:  Right foot pain. No known injury. EXAM: RIGHT FOOT COMPLETE - 3+ VIEW COMPARISON:  None. FINDINGS: The joint spaces are maintained. Mild degenerative changes. No acute fracture is identified. IMPRESSION: Mild degenerative changes but no acute bony findings. Electronically Signed   By: Rudie Meyer M.D.   On: 01/06/2020 07:03    Procedures Procedures (including critical care time)  Medications Ordered in ED Medications  acetaminophen (TYLENOL) tablet 1,000 mg (has no administration in time range)    ED Course  I have reviewed the triage vital signs and the nursing notes.  Pertinent labs & imaging results that were available during my care of the patient were reviewed by me and considered in my medical decision making (see chart for details).    MDM  Rules/Calculators/A&P                          Patient with arthritis and possibly gout history presents with right midfoot pain without recalled injury.  X-ray ordered and reviewed mild degeneration without fracture.  Tylenol ordered for pain.  Discussed trial of steroids for possibly gout and close follow-up with primary doctor.  No sign of infection at this time.  Interpreter used to clarify story and no further details obtained. Patient comfortable with outpatient follow-up with primary doctor and prednisone will be given.  final Clinical Impression(s) / ED Diagnoses Final diagnoses:  None    Rx / DC Orders ED Discharge Orders  None       Blane Ohara, MD 01/06/20 [phone]

## 2020-01-06 NOTE — ED Triage Notes (Signed)
Pt c/o right foot pain, denies any injury.

## 2020-01-06 NOTE — Discharge Instructions (Signed)
Use Tylenol and ice every 4 hours as needed for pain and swelling. Take steroids as directed and follow-up with the primary care doctor on Monday or Tuesday. Return for spreading redness, fevers or new concerns.

## 2020-01-06 NOTE — ED Notes (Signed)
Pt verbalized understanding using the interpreter machine

## 2020-02-29 ENCOUNTER — Other Ambulatory Visit: Payer: Self-pay

## 2020-02-29 ENCOUNTER — Emergency Department (HOSPITAL_COMMUNITY)
Admission: EM | Admit: 2020-02-29 | Discharge: 2020-03-01 | Discharge status: Against Medical Advice | Payer: Medicare Other

## 2020-02-29 ENCOUNTER — Encounter (HOSPITAL_COMMUNITY): Payer: Self-pay | Admitting: *Deleted

## 2020-02-29 NOTE — ED Triage Notes (Addendum)
Pt c/o dental pain to L upper x 4 days   Also asking for dose and prescription for blood pressure medication

## 2020-04-28 ENCOUNTER — Other Ambulatory Visit: Payer: Self-pay | Admitting: Internal Medicine

## 2020-04-28 ENCOUNTER — Encounter: Payer: Self-pay | Admitting: Internal Medicine

## 2020-04-28 ENCOUNTER — Other Ambulatory Visit: Payer: Self-pay

## 2020-04-28 ENCOUNTER — Ambulatory Visit: Payer: Medicare Other | Attending: Internal Medicine | Admitting: Internal Medicine

## 2020-04-28 ENCOUNTER — Ambulatory Visit (HOSPITAL_BASED_OUTPATIENT_CLINIC_OR_DEPARTMENT_OTHER): Payer: Medicare Other | Admitting: Pharmacist

## 2020-04-28 VITALS — BP 140/90 | HR 101 | Resp 16 | Wt 184.2 lb

## 2020-04-28 DIAGNOSIS — R351 Nocturia: Secondary | ICD-10-CM | POA: Insufficient documentation

## 2020-04-28 DIAGNOSIS — N183 Chronic kidney disease, stage 3 unspecified: Secondary | ICD-10-CM | POA: Insufficient documentation

## 2020-04-28 DIAGNOSIS — M542 Cervicalgia: Secondary | ICD-10-CM

## 2020-04-28 DIAGNOSIS — M170 Bilateral primary osteoarthritis of knee: Secondary | ICD-10-CM

## 2020-04-28 DIAGNOSIS — G8929 Other chronic pain: Secondary | ICD-10-CM

## 2020-04-28 DIAGNOSIS — I129 Hypertensive chronic kidney disease with stage 1 through stage 4 chronic kidney disease, or unspecified chronic kidney disease: Secondary | ICD-10-CM | POA: Insufficient documentation

## 2020-04-28 DIAGNOSIS — Z23 Encounter for immunization: Secondary | ICD-10-CM | POA: Insufficient documentation

## 2020-04-28 DIAGNOSIS — Z6831 Body mass index (BMI) 31.0-31.9, adult: Secondary | ICD-10-CM | POA: Insufficient documentation

## 2020-04-28 DIAGNOSIS — Z79899 Other long term (current) drug therapy: Secondary | ICD-10-CM | POA: Diagnosis not present

## 2020-04-28 DIAGNOSIS — M4726 Other spondylosis with radiculopathy, lumbar region: Secondary | ICD-10-CM | POA: Diagnosis not present

## 2020-04-28 DIAGNOSIS — E669 Obesity, unspecified: Secondary | ICD-10-CM | POA: Insufficient documentation

## 2020-04-28 DIAGNOSIS — Z833 Family history of diabetes mellitus: Secondary | ICD-10-CM | POA: Insufficient documentation

## 2020-04-28 DIAGNOSIS — I10 Essential (primary) hypertension: Secondary | ICD-10-CM

## 2020-04-28 DIAGNOSIS — Z1211 Encounter for screening for malignant neoplasm of colon: Secondary | ICD-10-CM | POA: Insufficient documentation

## 2020-04-28 DIAGNOSIS — N401 Enlarged prostate with lower urinary tract symptoms: Secondary | ICD-10-CM

## 2020-04-28 DIAGNOSIS — M545 Low back pain, unspecified: Secondary | ICD-10-CM

## 2020-04-28 MED ORDER — TAMSULOSIN HCL 0.4 MG PO CAPS: 0.4000 mg | ORAL_CAPSULE | Freq: Every day | ORAL | 4 refills | Status: DC

## 2020-04-28 MED ORDER — DICLOFENAC SODIUM 1 % EX GEL: 4.0000 g | Freq: Four times a day (QID) | CUTANEOUS | 2 refills | Status: DC

## 2020-04-28 MED ORDER — AMLODIPINE BESYLATE 10 MG PO TABS: 10.0000 mg | ORAL_TABLET | Freq: Every day | ORAL | 4 refills | Status: DC

## 2020-04-28 MED ORDER — ACETAMINOPHEN 500 MG PO TABS: 500.0000 mg | ORAL_TABLET | Freq: Two times a day (BID) | ORAL | 0 refills | Status: DC | PRN

## 2020-04-28 MED FILL — DICLOFENAC SODIUM 1% GEL: 1 | 16 days supply | Qty: 100 | Fill #0

## 2020-04-28 MED FILL — AMLODIPINE BESYLATE 10 MG T: 10 | 90 days supply | Qty: 90 | Fill #0

## 2020-04-28 MED FILL — TAMSULOSIN HCL 0.4 MG CAP: 0.4 | 90 days supply | Qty: 90 | Fill #0

## 2020-04-28 NOTE — Progress Notes (Signed)
Patient ID: Tyrone Mcdonald, male    DOB: 25-Oct-1950  MRN: [id number]  CC: Hypertension, Back Pain, and Neck Pain   Subjective: Tyrone Mcdonald is a 69 y.o. male who presents for chronic ds management.  I last saw him 10/2018 His concerns today include:  Patient with history ofHTN,BPH,lumbar radiculopathy, OA BL knees.   HTN: reports being out of BP med (Norvasc on list) x 1 wk.  However, last rxn written 05/2019 for 1 mth supply so I suspect he has been out of med for much longer than what was stated.   -endorses that he limits salt intake -no device to check BP No CP.  SOB when out of BP med No HA/dizziness, LE edema  Still c/o chronic neck and LBP.  Pain disturbs sleep.  Knees feel weak.  No numbness or tingling in arms or legs.  No incontinence of urine or bowel.  Rates pain as 10/10 and persistent. Voltaren Gel very helpful Difficult to walk due to pain and swelling in the knees.  Has known OA knees.  No falls. Not working.  On disability  BPH:  +Frequent urination at nights.  No dysuria.  Stop and go urine flow.  Does not feel like complete empty after he urinates.. Patient Active Problem List   Diagnosis Date Noted  . Osteoarthritis of both knees 05/23/2017  . CKD (chronic kidney disease) stage 3, GFR 30-59 ml/min (HCC) 05/23/2017  . Positive depression screening 05/23/2017  . Benign prostatic hyperplasia with urinary frequency 03/28/2017  . Lumbar radiculopathy 03/28/2017  . Chronic neck pain 03/28/2017  . Osteoarthritis of spine with radiculopathy, lumbar region 02/28/2016  . Language barrier affecting health care 02/28/2016  . Abnormal PSA 02/28/2016  . Essential hypertension 07/25/2014  . Family history of diabetes mellitus (DM) 07/25/2014  . Rhinitis, allergic 07/25/2014     Current Outpatient Medications on File Prior to Visit  Medication Sig Dispense Refill  . lidocaine (LIDODERM) 5 % Place 1 patch onto the skin daily. Remove & Discard patch within  12 hours or as directed by MD (Patient not taking: Reported on 04/28/2020) 30 patch 0   No current facility-administered medications on file prior to visit.    No Known Allergies  Social History   Socioeconomic History  . Marital status: Married    Spouse name: Not on file  . Number of children: Not on file  . Years of education: Not on file  . Highest education level: Not on file  Occupational History  . Not on file  Tobacco Use  . Smoking status: Never Smoker  . Smokeless tobacco: Never Used  Vaping Use  . Vaping Use: Never used  Substance and Sexual Activity  . Alcohol use: No    Alcohol/week: 0.0 standard drinks  . Drug use: No  . Sexual activity: Not Currently  Other Topics Concern  . Not on file  Social History Narrative   ** Merged History Encounter **       Social Determinants of Health   Financial Resource Strain:   . Difficulty of Paying Living Expenses: Not on file  Food Insecurity:   . Worried About Programme researcher, broadcasting/film/video in the Last Year: Not on file  . Ran Out of Food in the Last Year: Not on file  Transportation Needs:   . Lack of Transportation (Medical): Not on file  . Lack of Transportation (Non-Medical): Not on file  Physical Activity:   . Days of Exercise per Week:  Not on file  . Minutes of Exercise per Session: Not on file  Stress:   . Feeling of Stress : Not on file  Social Connections:   . Frequency of Communication with Friends and Family: Not on file  . Frequency of Social Gatherings with Friends and Family: Not on file  . Attends Religious Services: Not on file  . Active Member of Clubs or Organizations: Not on file  . Attends Banker Meetings: Not on file  . Marital Status: Not on file  Intimate Partner Violence:   . Fear of Current or Ex-Partner: Not on file  . Emotionally Abused: Not on file  . Physically Abused: Not on file  . Sexually Abused: Not on file    Family History  Problem Relation Age of Onset  .  Hyperlipidemia Father   . Cancer Brother   . Diabetes Paternal Uncle     No past surgical history on file.  ROS: Review of Systems Negative except as stated above  PHYSICAL EXAM: BP 140/90   Pulse (!) 101   Resp 16   Wt 184 lb 3.2 oz (83.6 kg)   SpO2 95%   BMI 31.62 kg/m   Wt Readings from Last 3 Encounters:  04/28/20 184 lb 3.2 oz (83.6 kg)  11/08/18 164 lb 14.5 oz (74.8 kg)  10/31/18 164 lb 14.5 oz (74.8 kg)    Physical Exam   General appearance - alert, well appearing, and in no distress Mental status - normal mood, behavior, speech, dress, motor activity, and thought processes Nose - normal and patent, no erythema, discharge or polyps Mouth - mucous membranes moist, pharynx normal without lesions Neck - supple, no significant adenopathy Chest - clear to auscultation, no wheezes, rales or rhonchi, symmetric air entry Heart - normal rate, regular rhythm, normal S1, S2, no murmurs, rubs, clicks or gallops Musculoskeletal -knees: Moderately enlarged.  No point tenderness.  Moderate crepitus on passive range of motion.  He walks unassisted. Extremities -no lower extremity edema. Depression screen [facility] 2/9 04/28/2020 07/09/2018 05/23/2017  Decreased Interest 0 3 3  Down, Depressed, Hopeless 0 3 3  PHQ - 2 Score 0 6 6  Altered sleeping - 2 3  Tired, decreased energy - 3 3  Change in appetite - 3 2  Feeling bad or failure about yourself  - 3 2  Trouble concentrating - 3 3  Moving slowly or fidgety/restless - 0 3  Suicidal thoughts - 0 0  PHQ-9 Score - 20 22    CMP Latest Ref Rng & Units 11/16/2018 11/08/2018 10/31/2018  Glucose 70 - 99 mg/dL 544(B) 201(E) 071(Q)  BUN 8 - 23 mg/dL 16 17 15   Creatinine 0.61 - 1.24 mg/dL 1.97(J) 8.83(G) 5.49(I)  Sodium 135 - 145 mmol/L 139 138 138  Potassium 3.5 - 5.1 mmol/L 4.1 4.3 4.0  Chloride 98 - 111 mmol/L 101 105 104  CO2 22 - 32 mmol/L 23 22 24   Calcium 8.9 - 10.3 mg/dL 8.9 9.0 9.5  Total Protein 6.5 - 8.1 g/dL - - -  Total  Bilirubin 0.3 - 1.2 mg/dL - - -  Alkaline Phos 38 - 126 U/L - - -  AST 15 - 41 U/L - - -  ALT 0 - 44 U/L - - -   Lipid Panel     Component Value Date/Time   CHOL 172 07/25/2014 0957   TRIG 151 (H) 07/25/2014 0957   HDL 48 07/25/2014 0957   CHOLHDL 3.6 07/25/2014 0957  VLDL 30 07/25/2014 0957   LDLCALC 94 07/25/2014 0957    CBC    Component Value Date/Time   WBC 6.7 11/16/2018 0622   RBC 5.14 11/16/2018 0622   HGB 13.5 11/16/2018 0622   HCT 42.2 11/16/2018 0622   PLT 187 11/16/2018 0622   MCV 82.1 11/16/2018 0622   MCH 26.3 11/16/2018 0622   MCHC 32.0 11/16/2018 0622   RDW 12.7 11/16/2018 0622   LYMPHSABS 2.4 11/16/2018 0622   MONOABS 0.5 11/16/2018 0622   EOSABS 0.5 11/16/2018 0622   BASOSABS 0.1 11/16/2018 0622    ASSESSMENT AND PLAN: 1. Essential hypertension Not at goal.  Refill amlodipine.  Continue low-salt diet. - CBC - Comprehensive metabolic panel - Lipid panel - amLODipine (NORVASC) 10 MG tablet; Take 1 tablet (10 mg total) by mouth daily.  Dispense: 30 tablet; Refill: 4  2. Primary osteoarthritis of both knees Refill Voltaren gel.  Was given Tylenol to use as needed.  Patient agreeable for referral to orthopedics to be considered for steroid injections. - Ambulatory referral to Orthopedic Surgery - diclofenac Sodium (VOLTAREN) 1 % GEL; Apply 4 g topically 4 (four) times daily.  Dispense: 100 g; Refill: 2 - acetaminophen (TYLENOL) 500 MG tablet; Take 1 tablet (500 mg total) by mouth 2 (two) times daily as needed for moderate pain.  Dispense: 60 tablet; Refill: 0  3. Chronic neck pain - diclofenac Sodium (VOLTAREN) 1 % GEL; Apply 4 g topically 4 (four) times daily.  Dispense: 100 g; Refill: 2 - acetaminophen (TYLENOL) 500 MG tablet; Take 1 tablet (500 mg total) by mouth 2 (two) times daily as needed for moderate pain.  Dispense: 60 tablet; Refill: 0  4. Chronic bilateral low back pain without sciatica - diclofenac Sodium (VOLTAREN) 1 % GEL; Apply 4 g  topically 4 (four) times daily.  Dispense: 100 g; Refill: 2 - acetaminophen (TYLENOL) 500 MG tablet; Take 1 tablet (500 mg total) by mouth 2 (two) times daily as needed for moderate pain.  Dispense: 60 tablet; Refill: 0  5. Benign prostatic hyperplasia with nocturia Will prescribe Flomax to help decrease his symptoms.  He is agreeable to having PSA drawn. - PSA  6. Obesity (BMI 30.0-34.9) Discussed and encourage healthy eating habits.  Encouraged him to move as much as his knees would allow.  Weight loss will help decrease arthritis pain in his knees  7. Need for influenza vaccination Given today  8. Need for vaccination against Streptococcus pneumoniae using pneumococcal conjugate vaccine 13 Given today  9. Screening for colon cancer Discussed colon cancer screening.  Patient prefers to do the fit test. - Fecal occult blood, imunochemical([facility])   Patient was given the opportunity to ask questions.  Patient verbalized understanding of the plan and was able to repeat key elements of the plan.  [employer] interpreter used during this encounter.#[id number]  Orders Placed This Encounter  Procedures  . Fecal occult blood, imunochemical([facility])  . CBC  . Comprehensive metabolic panel  . Lipid panel  . PSA  . Ambulatory referral to Orthopedic Surgery     Requested Prescriptions   Signed Prescriptions Disp Refills  . amLODipine (NORVASC) 10 MG tablet 30 tablet 4    Sig: Take 1 tablet (10 mg total) by mouth daily.  . tamsulosin (FLOMAX) 0.4 MG CAPS capsule 30 capsule 4    Sig: Take 1 capsule (0.4 mg total) by mouth daily.  . diclofenac Sodium (VOLTAREN) 1 % GEL 100 g 2    Sig: Apply 4 g  topically 4 (four) times daily.  Marland Kitchen acetaminophen (TYLENOL) 500 MG tablet 60 tablet 0    Sig: Take 1 tablet (500 mg total) by mouth 2 (two) times daily as needed for moderate pain.    Return in about 4 months (around 08/29/2020).  Jonah Blue, MD, FACP

## 2020-04-28 NOTE — Patient Instructions (Signed)
Pneumococcal Conjugate Vaccine (PCV13): What You Need to Know 1. Why get vaccinated? Pneumococcal conjugate vaccine (PCV13) can prevent pneumococcal disease. Pneumococcal disease refers to any illness caused by pneumococcal bacteria. These bacteria can cause many types of illnesses, including pneumonia, which is an infection of the lungs. Pneumococcal bacteria are one of the most common causes of pneumonia. Besides pneumonia, pneumococcal bacteria can also cause:  Ear infections  Sinus infections  Meningitis (infection of the tissue covering the brain and spinal cord)  Bacteremia (bloodstream infection) Anyone can get pneumococcal disease, but children under 76 years of age, people with certain medical conditions, adults 61 years or older, and cigarette smokers are at the highest risk. Most pneumococcal infections are mild. However, some can result in long-term problems, such as brain damage or hearing loss. Meningitis, bacteremia, and pneumonia caused by pneumococcal disease can be fatal. 2. PCV13 PCV13 protects against 13 types of bacteria that cause pneumococcal disease. Infants and young children usually need 4 doses of pneumococcal conjugate vaccine, at 2, 4, 6, and 63-31 months of age. In some cases, a child might need fewer than 4 doses to complete PCV13 vaccination. A dose of PCV23 vaccine is also recommended for anyone 2 years or older with certain medical conditions if they did not already receive PCV13. This vaccine may be given to adults 55 years or older based on discussions between the patient and health care provider. 3. Talk with your health care provider Tell your vaccine provider if the person getting the vaccine:  Has had an allergic reaction after a previous dose of PCV13, to an earlier pneumococcal conjugate vaccine known as PCV7, or to any vaccine containing diphtheria toxoid (for example, DTaP), or has any severe, life-threatening allergies.  In some cases, your health  care provider may decide to postpone PCV13 vaccination to a future visit. People with minor illnesses, such as a cold, may be vaccinated. People who are moderately or severely ill should usually wait until they recover before getting PCV13. Your health care provider can give you more information. 4. Risks of a vaccine reaction  Redness, swelling, pain, or tenderness where the shot is given, and fever, loss of appetite, fussiness (irritability), feeling tired, headache, and chills can happen after PCV13. Young children may be at increased risk for seizures caused by fever after PCV13 if it is administered at the same time as inactivated influenza vaccine. Ask your health care provider for more information. People sometimes faint after medical procedures, including vaccination. Tell your provider if you feel dizzy or have vision changes or ringing in the ears. As with any medicine, there is a very remote chance of a vaccine causing a severe allergic reaction, other serious injury, or death. 5. What if there is a serious problem? An allergic reaction could occur after the vaccinated person leaves the clinic. If you see signs of a severe allergic reaction (hives, swelling of the face and throat, difficulty breathing, a fast heartbeat, dizziness, or weakness), call 9-1-1 and get the person to the nearest hospital. For other signs that concern you, call your health care provider. Adverse reactions should be reported to the Vaccine Adverse Event Reporting System (VAERS). Your health care provider will usually file this report, or you can do it yourself. Visit the VAERS website at www.vaers.SamedayNews.es or call 530-808-2543. VAERS is only for reporting reactions, and VAERS staff do not give medical advice. 6. The National Vaccine Injury Compensation Program The [employer] Vaccine Injury Compensation Program (VICP) is a Technical brewer  that was created to compensate people who may have been injured by certain  vaccines. Visit the VICP website at SpiritualWord.at or call (334)316-6434 to learn about the program and about filing a claim. There is a time limit to file a claim for compensation. 7. How can I learn more?  Ask your health care provider.  Call your local or state health department.  Contact the Centers for Disease Control and Prevention (CDC): ? Call (916)046-6407 (1-800-CDC-INFO) or ? Visit CDC's website at PicCapture.uy Vaccine Information Statement PCV13 Vaccine (05/06/2018) This information is not intended to replace advice given to you by your health care provider. Make sure you discuss any questions you have with your health care provider. Document Revised: 10/13/2018 Document Reviewed: 02/03/2018 Elsevier Patient Education  2020 Elsevier Inc.   Influenza Virus Vaccine injection (Fluarix) What is this medicine? INFLUENZA VIRUS VACCINE (in floo EN zuh VAHY ruhs vak SEEN) helps to reduce the risk of getting influenza also known as the flu. This medicine may be used for other purposes; ask your health care provider or pharmacist if you have questions. COMMON BRAND NAME(S): Fluarix, Fluzone What should I tell my health care provider before I take this medicine? They need to know if you have any of these conditions:  bleeding disorder like hemophilia  fever or infection  Guillain-Barre syndrome or other neurological problems  immune system problems  infection with the human immunodeficiency virus (HIV) or AIDS  low blood platelet counts  multiple sclerosis  an unusual or allergic reaction to influenza virus vaccine, eggs, chicken proteins, latex, gentamicin, other medicines, foods, dyes or preservatives  pregnant or trying to get pregnant  breast-feeding How should I use this medicine? This vaccine is for injection into a muscle. It is given by a health care professional. A copy of Vaccine Information Statements will be given before each  vaccination. Read this sheet carefully each time. The sheet may change frequently. Talk to your pediatrician regarding the use of this medicine in children. Special care may be needed. Overdosage: If you think you have taken too much of this medicine contact a poison control center or emergency room at once. NOTE: This medicine is only for you. Do not share this medicine with others. What if I miss a dose? This does not apply. What may interact with this medicine?  chemotherapy or radiation therapy  medicines that lower your immune system like etanercept, anakinra, infliximab, and adalimumab  medicines that treat or prevent blood clots like warfarin  phenytoin  steroid medicines like prednisone or cortisone  theophylline  vaccines This list may not describe all possible interactions. Give your health care provider a list of all the medicines, herbs, non-prescription drugs, or dietary supplements you use. Also tell them if you smoke, drink alcohol, or use illegal drugs. Some items may interact with your medicine. What should I watch for while using this medicine? Report any side effects that do not go away within 3 days to your doctor or health care professional. Call your health care provider if any unusual symptoms occur within 6 weeks of receiving this vaccine. You may still catch the flu, but the illness is not usually as bad. You cannot get the flu from the vaccine. The vaccine will not protect against colds or other illnesses that may cause fever. The vaccine is needed every year. What side effects may I notice from receiving this medicine? Side effects that you should report to your doctor or health care professional as soon as  possible:  allergic reactions like skin rash, itching or hives, swelling of the face, lips, or tongue Side effects that usually do not require medical attention (report to your doctor or health care professional if they continue or are  bothersome):  fever  headache  muscle aches and pains  pain, tenderness, redness, or swelling at site where injected  weak or tired This list may not describe all possible side effects. Call your doctor for medical advice about side effects. You may report side effects to FDA at 1-800-FDA-1088. Where should I keep my medicine? This vaccine is only given in a clinic, pharmacy, doctor's office, or other health care setting and will not be stored at home. NOTE: This sheet is a summary. It may not cover all possible information. If you have questions about this medicine, talk to your doctor, pharmacist, or health care provider.  2020 Elsevier/Gold Standard (2008-01-20 09:30:40)

## 2020-04-28 NOTE — Progress Notes (Signed)
Pt states his knees are swollen

## 2020-04-28 NOTE — Progress Notes (Signed)
Patient presents for vaccination against influenza, strep pneumo per orders of Dr. Laural Benes. Consent given. Counseling provided. No contraindications exists. Vaccine administered without incident.   Butch Penny, PharmD, CPP Clinical Pharmacist [facility] [phone]

## 2020-04-29 ENCOUNTER — Other Ambulatory Visit: Payer: Self-pay | Admitting: Internal Medicine

## 2020-04-29 DIAGNOSIS — R972 Elevated prostate specific antigen [PSA]: Secondary | ICD-10-CM

## 2020-04-29 DIAGNOSIS — E782 Mixed hyperlipidemia: Secondary | ICD-10-CM | POA: Insufficient documentation

## 2020-04-29 DIAGNOSIS — N183 Chronic kidney disease, stage 3 unspecified: Secondary | ICD-10-CM

## 2020-04-29 LAB — COMPREHENSIVE METABOLIC PANEL
ALT: 20 IU/L (ref 0–44)
AST: 22 IU/L (ref 0–40)
Albumin/Globulin Ratio: 1.6 (ref 1.2–2.2)
Albumin: 4.5 g/dL (ref 3.8–4.8)
Alkaline Phosphatase: 100 IU/L (ref 44–121)
BUN/Creatinine Ratio: 11 (ref 10–24)
BUN: 22 mg/dL (ref 8–27)
Bilirubin Total: 0.3 mg/dL (ref 0.0–1.2)
CO2: 26 mmol/L (ref 20–29)
Calcium: 9.7 mg/dL (ref 8.6–10.2)
Chloride: 105 mmol/L (ref 96–106)
Creatinine, Ser: 1.96 mg/dL — ABNORMAL HIGH (ref 0.76–1.27)
GFR calc Af Amer: 39 mL/min/{1.73_m2} — ABNORMAL LOW (ref >59)
GFR calc non Af Amer: 34 mL/min/{1.73_m2} — ABNORMAL LOW (ref >59)
Globulin, Total: 2.9 g/dL (ref 1.5–4.5)
Glucose: 93 mg/dL (ref 65–99)
Potassium: 4.3 mmol/L (ref 3.5–5.2)
Sodium: 143 mmol/L (ref 134–144)
Total Protein: 7.4 g/dL (ref 6.0–8.5)

## 2020-04-29 LAB — CBC
Hematocrit: 43.3 % (ref 37.5–51.0)
Hemoglobin: 14.1 g/dL (ref 13.0–17.7)
MCH: 26.2 pg — ABNORMAL LOW (ref 26.6–33.0)
MCHC: 32.6 g/dL (ref 31.5–35.7)
MCV: 80 fL (ref 79–97)
Platelets: 205 10*3/uL (ref 150–450)
RBC: 5.39 x10E6/uL (ref 4.14–5.80)
RDW: 12.6 % (ref 11.6–15.4)
WBC: 5.7 10*3/uL (ref 3.4–10.8)

## 2020-04-29 LAB — LIPID PANEL
Chol/HDL Ratio: 5 ratio (ref 0.0–5.0)
Cholesterol, Total: 234 mg/dL — ABNORMAL HIGH (ref 100–199)
HDL: 47 mg/dL (ref >39)
LDL Chol Calc (NIH): 136 mg/dL — ABNORMAL HIGH (ref 0–99)
Triglycerides: 284 mg/dL — ABNORMAL HIGH (ref 0–149)
VLDL Cholesterol Cal: 51 mg/dL — ABNORMAL HIGH (ref 5–40)

## 2020-04-29 LAB — PSA: Prostate Specific Ag, Serum: 6.3 ng/mL — ABNORMAL HIGH (ref 0.0–4.0)

## 2020-04-29 LAB — FECAL OCCULT BLOOD, IMMUNOCHEMICAL: Fecal Occult Bld: NEGATIVE

## 2020-04-29 MED ORDER — ATORVASTATIN CALCIUM 10 MG PO TABS: 10.0000 mg | ORAL_TABLET | Freq: Every day | ORAL | 2 refills | Status: AC

## 2020-04-29 NOTE — Progress Notes (Signed)
Let patient know that his blood cell counts are normal. His kidney function is not 100% and has worsened compared to when it was last checked in May of last year.  I will refer him to a kidney specialist called the nephrologist for further evaluation.  He should avoid taking any over-the-counter pain medications like ibuprofen, Motrin, Aleve, Advil, and Naprosyn as these can make his kidney function worse. Liver function tests normal. Total and LDL cholesterol are elevated.  High cholesterol increases risk for heart attack and strokes.  I recommend starting low-dose of a medication called atorvastatin to help lower cholesterol.  If he develops any muscle aches or or cramps on the medication he should stop the medicine and let me know.  The prescription has been sent to the pharmacy for him to pick up. PSA which is the screening test for prostate cancer is mildly elevated.  I will refer him to urologist for further evaluation.

## 2020-05-01 MED FILL — ATORVASTATIN 10 MG TABLET: 10 | 30 days supply | Qty: 30 | Fill #0

## 2020-05-02 NOTE — Progress Notes (Signed)
Arabic normal result letter generated and mailed to address on file. 

## 2020-09-20 ENCOUNTER — Ambulatory Visit: Payer: Self-pay | Admitting: *Deleted

## 2020-09-20 NOTE — Telephone Encounter (Signed)
   HA    2        Tyrone Mcdonald Male, 69 y.o., 1951-04-25  MRN:  [id number] Phone:  [phone] Judie Petit) Needs Interpreter: Arabic      PCP:  Patient, No Pcp Per Primary Cvg:  [facility] 100%  Next Appt With Internal Medicine 11/14/2020 at 3:30 PM         Message from Randol Kern sent at 09/20/2020 12:12 PM EDT  Summary: Call back request    Pt's son called reporting that the patient has abdominal pain, stated that he is not with the patient right now but needs a nurse to call him  [phone]          Call History   Type Contact Phone/Fax User  09/20/2020 01:51 PM EDT Phone (Outgoing) Larios, Thorin Eltom [place] (Self) [phone] Rexene Edison) Esther Hardy, RN  Left Message   09/20/2020 12:23 PM EDT Phone (Outgoing) Budge, Chi Eltom [place] (Self) [phone] Rexene Edison) Karsten Fells, RN  Left Message -  Left message via interpreter to return call.  09/20/2020 12:21 PM EDT Phone (Incoming) Horsley, Mizraim Eltom [place] (Self) [phone] Rexene Edison) Karsten Fells, RN  09/20/2020 12:11 PM EDT Phone (Incoming) Dorothyann Peng (Emergency Contact) [phone] Randol Kern   Encounter Report  Patient Encounter Report   Attempted to reach pt. X 3. Messages left to call the practice back.

## 2020-09-22 ENCOUNTER — Encounter (HOSPITAL_COMMUNITY): Payer: Self-pay | Admitting: Emergency Medicine

## 2020-09-22 ENCOUNTER — Other Ambulatory Visit: Payer: Self-pay

## 2020-09-22 ENCOUNTER — Emergency Department (HOSPITAL_COMMUNITY): Payer: Medicare Other

## 2020-09-22 ENCOUNTER — Emergency Department (HOSPITAL_COMMUNITY)
Admission: EM | Admit: 2020-09-22 | Discharge: 2020-09-22 | Disposition: A | Discharge status: Home / Self Care | Payer: Medicare Other | Attending: Emergency Medicine | Admitting: Emergency Medicine

## 2020-09-22 DIAGNOSIS — N183 Chronic kidney disease, stage 3 unspecified: Secondary | ICD-10-CM | POA: Insufficient documentation

## 2020-09-22 DIAGNOSIS — Z79899 Other long term (current) drug therapy: Secondary | ICD-10-CM | POA: Diagnosis not present

## 2020-09-22 DIAGNOSIS — M25531 Pain in right wrist: Secondary | ICD-10-CM | POA: Diagnosis not present

## 2020-09-22 DIAGNOSIS — M79601 Pain in right arm: Secondary | ICD-10-CM | POA: Insufficient documentation

## 2020-09-22 DIAGNOSIS — R1011 Right upper quadrant pain: Secondary | ICD-10-CM | POA: Diagnosis not present

## 2020-09-22 DIAGNOSIS — I129 Hypertensive chronic kidney disease with stage 1 through stage 4 chronic kidney disease, or unspecified chronic kidney disease: Secondary | ICD-10-CM | POA: Diagnosis not present

## 2020-09-22 DIAGNOSIS — J45909 Unspecified asthma, uncomplicated: Secondary | ICD-10-CM | POA: Diagnosis not present

## 2020-09-22 DIAGNOSIS — R0789 Other chest pain: Secondary | ICD-10-CM

## 2020-09-22 DIAGNOSIS — R079 Chest pain, unspecified: Secondary | ICD-10-CM | POA: Diagnosis present

## 2020-09-22 LAB — CBC
HCT: 40.4 % (ref 39.0–52.0)
Hemoglobin: 13.3 g/dL (ref 13.0–17.0)
MCH: 26.7 pg (ref 26.0–34.0)
MCHC: 32.9 g/dL (ref 30.0–36.0)
MCV: 81 fL (ref 80.0–100.0)
Platelets: 193 10*3/uL (ref 150–400)
RBC: 4.99 MIL/uL (ref 4.22–5.81)
RDW: 13.2 % (ref 11.5–15.5)
WBC: 5.9 10*3/uL (ref 4.0–10.5)
nRBC: 0 % (ref 0.0–0.2)

## 2020-09-22 LAB — BASIC METABOLIC PANEL
Anion gap: 9 (ref 5–15)
BUN: 17 mg/dL (ref 8–23)
CO2: 23 mmol/L (ref 22–32)
Calcium: 9.1 mg/dL (ref 8.9–10.3)
Chloride: 104 mmol/L (ref 98–111)
Creatinine, Ser: 1.75 mg/dL — ABNORMAL HIGH (ref 0.61–1.24)
GFR, Estimated: 41 mL/min — ABNORMAL LOW (ref >60)
Glucose, Bld: 155 mg/dL — ABNORMAL HIGH (ref 70–99)
Potassium: 3.9 mmol/L (ref 3.5–5.1)
Sodium: 136 mmol/L (ref 135–145)

## 2020-09-22 LAB — HEPATIC FUNCTION PANEL
ALT: 21 U/L (ref 0–44)
AST: 23 U/L (ref 15–41)
Albumin: 3.4 g/dL — ABNORMAL LOW (ref 3.5–5.0)
Alkaline Phosphatase: 75 U/L (ref 38–126)
Bilirubin, Direct: <0.1 mg/dL (ref 0.0–0.2)
Total Bilirubin: 0.8 mg/dL (ref 0.3–1.2)
Total Protein: 6.7 g/dL (ref 6.5–8.1)

## 2020-09-22 LAB — TROPONIN I (HIGH SENSITIVITY)
Troponin I (High Sensitivity): 5 ng/L (ref <18)
Troponin I (High Sensitivity): 4 ng/L (ref <18)

## 2020-09-22 LAB — LIPASE, BLOOD: Lipase: 37 U/L (ref 11–51)

## 2020-09-22 MED ORDER — METHYLPREDNISOLONE 4 MG PO TBPK: ORAL_TABLET | ORAL | 0 refills | Status: DC

## 2020-09-22 MED ORDER — FENTANYL CITRATE (PF) 100 MCG/2ML IJ SOLN
50.0000 ug | Freq: Once | INTRAMUSCULAR | Status: AC
  Administered 2020-09-22: 50 ug via INTRAVENOUS
  Filled 2020-09-22: qty 2

## 2020-09-22 NOTE — ED Provider Notes (Addendum)
Tyrone Mcdonald [facility] EMERGENCY DEPARTMENT Provider Note   CSN: [id number] Arrival date & time: 09/22/20  1529     History Chief Complaint  Patient presents with  . Arm Pain  . Chest Pain    Tyrone Mcdonald is a 70 y.o. male.  Patient with history of chronic back pain, hypertension who presents to the ED with multiple complaints.  Having right-sided chest pain but appears to be mostly in the right upper abdomen.  States that it radiates to his right shoulder.  Having some right wrist pain as well.  No neck pain, no headaches.  No shortness of breath.  No nausea, vomiting, diarrhea.  Does have a history of arthritis.  Some increased physical activity recently.  The history is provided by the patient.  Arm Pain This is a new problem. The current episode started more than 2 days ago. The problem occurs constantly. The problem has not changed since onset.Associated symptoms include chest pain and abdominal pain. Pertinent negatives include no shortness of breath. Exacerbated by: movement. Nothing relieves the symptoms. He has tried nothing for the symptoms. The treatment provided no relief.  Chest Pain Associated symptoms: abdominal pain   Associated symptoms: no back pain, no cough, no fever, no nausea, no palpitations, no shortness of breath and no vomiting        Past Medical History:  Diagnosis Date  . Asthma   . Chronic back pain   . Hypertension   . Sciatica     Patient Active Problem List   Diagnosis Date Noted  . Mixed hyperlipidemia 04/29/2020  . Osteoarthritis of both knees 05/23/2017  . CKD (chronic kidney disease) stage 3, GFR 30-59 ml/min (HCC) 05/23/2017  . Positive depression screening 05/23/2017  . Benign prostatic hyperplasia with urinary frequency 03/28/2017  . Lumbar radiculopathy 03/28/2017  . Chronic neck pain 03/28/2017  . Osteoarthritis of spine with radiculopathy, lumbar region 02/28/2016  . Language barrier affecting health care  02/28/2016  . Abnormal PSA 02/28/2016  . Essential hypertension 07/25/2014  . Family history of diabetes mellitus (DM) 07/25/2014  . Rhinitis, allergic 07/25/2014    History reviewed. No pertinent surgical history.     Family History  Problem Relation Age of Onset  . Hyperlipidemia Father   . Cancer Brother   . Diabetes Paternal Uncle     Social History   Tobacco Use  . Smoking status: Never Smoker  . Smokeless tobacco: Never Used  Vaping Use  . Vaping Use: Never used  Substance Use Topics  . Alcohol use: No    Alcohol/week: 0.0 standard drinks  . Drug use: No    Home Medications Prior to Admission medications   Medication Sig Start Date End Date Taking? Authorizing Provider  methylPREDNISolone (MEDROL DOSEPAK) 4 MG TBPK tablet Follow package insert 09/22/20  Yes Careena Degraffenreid, DO  acetaminophen (TYLENOL) 500 MG tablet Take 1 tablet (500 mg total) by mouth 2 (two) times daily as needed for moderate pain. 04/28/20   Marcine Matar, MD  amLODipine (NORVASC) 10 MG tablet Take 1 tablet (10 mg total) by mouth daily. 04/28/20   Marcine Matar, MD  atorvastatin (LIPITOR) 10 MG tablet Take 1 tablet (10 mg total) by mouth daily. 04/29/20   Marcine Matar, MD  diclofenac Sodium (VOLTAREN) 1 % GEL Apply 4 g topically 4 (four) times daily. 04/28/20   Marcine Matar, MD  lidocaine (LIDODERM) 5 % Place 1 patch onto the skin daily. Remove & Discard patch  within 12 hours or as directed by MD Patient not taking: Reported on 04/28/2020 08/18/19   Anselm Pancoast, PA-C  tamsulosin (FLOMAX) 0.4 MG CAPS capsule Take 1 capsule (0.4 mg total) by mouth daily. 04/28/20   Marcine Matar, MD    Allergies    Patient has no known allergies.  Review of Systems   Review of Systems  Constitutional: Negative for chills and fever.  HENT: Negative for ear pain and sore throat.   Eyes: Negative for pain and visual disturbance.  Respiratory: Negative for cough and shortness of breath.    Cardiovascular: Positive for chest pain. Negative for palpitations.  Gastrointestinal: Positive for abdominal pain. Negative for anal bleeding, constipation, diarrhea, nausea, rectal pain and vomiting.  Genitourinary: Negative for dysuria and hematuria.  Musculoskeletal: Positive for arthralgias (right arm but worse in the right hand/wrist). Negative for back pain, gait problem, joint swelling, myalgias, neck pain and neck stiffness.  Skin: Negative for color change and rash.  Neurological: Negative for seizures and syncope.  All other systems reviewed and are negative.   Physical Exam Updated Vital Signs  ED Triage Vitals  Enc Vitals Group     BP 09/22/20 1533 (!) 150/89     Pulse Rate 09/22/20 1533 (!) 108     Resp 09/22/20 1533 17     Temp 09/22/20 1533 99.1 F (37.3 C)     Temp Source 09/22/20 1533 Oral     SpO2 09/22/20 1533 90 %     Weight --      Height --      Head Circumference --      Peak Flow --      Pain Score 09/22/20 1605 10     Pain Loc --      Pain Edu? --      Excl. in GC? --     Physical Exam Vitals and nursing note reviewed.  Constitutional:      General: He is not in acute distress.    Appearance: He is well-developed. He is not ill-appearing.  HENT:     Head: Normocephalic and atraumatic.  Eyes:     Extraocular Movements: Extraocular movements intact.     Conjunctiva/sclera: Conjunctivae normal.     Pupils: Pupils are equal, round, and reactive to light.  Cardiovascular:     Rate and Rhythm: Normal rate and regular rhythm.     Pulses:          Radial pulses are 2+ on the right side and 2+ on the left side.     Heart sounds: Normal heart sounds. No murmur heard.   Pulmonary:     Effort: Pulmonary effort is normal. No respiratory distress.     Breath sounds: Normal breath sounds. No decreased breath sounds or wheezing.  Chest:     Chest wall: Tenderness (right anterior chest wall) present.  Abdominal:     Palpations: Abdomen is soft.      Tenderness: There is abdominal tenderness in the right upper quadrant.  Musculoskeletal:        General: Normal range of motion.     Cervical back: Normal range of motion and neck supple.     Right lower leg: No edema.     Left lower leg: No edema.     Comments: Tender in the right wrist and proximal hand with no major swelling, redness or warmth, no midline spinal pain in the C/T/L spine   Skin:    General: Skin is  warm and dry.     Capillary Refill: Capillary refill takes less than 2 seconds.     Findings: No erythema.  Neurological:     General: No focal deficit present.     Mental Status: He is alert.     Comments: 5+ out of 5 strength throughout, normal sensation, normal grip  Psychiatric:        Mood and Affect: Mood normal.     ED Results / Procedures / Treatments   Labs (all labs ordered are listed, but only abnormal results are displayed) Labs Reviewed  BASIC METABOLIC PANEL - Abnormal; Notable for the following components:      Result Value   Glucose, Bld 155 (*)    Creatinine, Ser 1.75 (*)    GFR, Estimated 41 (*)    All other components within normal limits  HEPATIC FUNCTION PANEL - Abnormal; Notable for the following components:   Albumin 3.4 (*)    All other components within normal limits  CBC  LIPASE, BLOOD  TROPONIN I (HIGH SENSITIVITY)  TROPONIN I (HIGH SENSITIVITY)    EKG EKG Interpretation  Date/Time:  Friday September 22 2020 16:11:17 EDT Ventricular Rate:  108 PR Interval:  134 QRS Duration: 74 QT Interval:  320 QTC Calculation: 428 R Axis:   37 Text Interpretation: Sinus tachycardia Otherwise normal ECG Confirmed by Virgina Norfolk [phone]) on 09/22/2020 4:51:31 PM   Radiology DG Chest 2 View  Result Date: 09/22/2020 CLINICAL DATA:  70 year old male with history of right arm and right chest pain for the past 2 days. EXAM: CHEST - 2 VIEW COMPARISON:  Chest x-ray 08/18/2019. FINDINGS: Lung volumes are low. No consolidative airspace disease. No  pleural effusions. No pneumothorax. No pulmonary nodule or mass noted. Pulmonary vasculature and the cardiomediastinal silhouette are within normal limits. IMPRESSION: 1. Low lung volumes without radiographic evidence of acute cardiopulmonary disease. Electronically Signed   By: Trudie Reed M.D.   On: 09/22/2020 17:00   DG Wrist Complete Right  Result Date: 09/22/2020 CLINICAL DATA:  Right wrist pain EXAM: RIGHT WRIST - COMPLETE 3+ VIEW COMPARISON:  None. FINDINGS: There is no evidence of fracture or dislocation. There is no evidence of arthropathy or other focal bone abnormality. Soft tissues are unremarkable. IMPRESSION: Negative. Electronically Signed   By: Charlett Nose M.D.   On: 09/22/2020 17:57    Procedures Procedures   Medications Ordered in ED Medications  fentaNYL (SUBLIMAZE) injection 50 mcg (50 mcg Intravenous Given 09/22/20 2115)    ED Course  I have reviewed the triage vital signs and the nursing notes.  Pertinent labs & imaging results that were available during my care of the patient were reviewed by me and considered in my medical decision making (see chart for details).    MDM Rules/Calculators/A&P                          Caylan Eltom Taggart Prasad is a 70 year old male with history of hypertension, arthritis who presents the ED with right-sided chest pain/abdominal pain, right arm pain.  Patient with overall unremarkable vitals.  Pain for the last several days.  Possibly worse with eating.  Worse with movement.  He is tender in the right wrist and hand but has good range of motion, no swelling or redness.  No concern for septic joint.  Denies any specific trauma.  Could be arthritis type pain.  Will check x-ray.  Having some right-sided chest pain, right upper quadrant  abdominal pain that is reproducible on exam.  Suspect possibly musculoskeletal process but could be gallbladder ACS.  Will check labs including troponin EKG.  No concern for PE as he is not hypoxic, has  no shortness of breath.  Has no PE risk factors.  No midline spinal pain.  Neurologically he is intact.  Overall troponins are normal.  No concern for ACS.  No PE risk factors and doubt PE.  Suspect MSK type pain.  He has some CKD and will treat with steroids.  No significant anemia, electrolyte abnormality, kidney injury otherwise.  Discharged in good condition.  Recommend close follow-up with primary care doctor.  Understands return precautions.  Patient is comfortable.  No concern for dissection.  Has normal pulses bilaterally in his upper extremities.  Has reproducible pain in the chest in the right arm.  Gallbladder, liver enzymes, pancreas enzymes normal.  Doubt cholecystitis or pancreatitis  This chart was dictated using voice recognition software.  Despite best efforts to proofread,  errors can occur which can change the documentation meaning.   Final Clinical Impression(s) / ED Diagnoses Final diagnoses:  Right arm pain  Atypical chest pain    Rx / DC Orders ED Discharge Orders         Ordered    methylPREDNISolone (MEDROL DOSEPAK) 4 MG TBPK tablet        09/22/20 2128           CuratoloMadelaine Bhat, DO 09/22/20 2129    Virgina Norfolk, DO 09/22/20 2130    Virgina Norfolk, DO 09/22/20 2130

## 2020-09-22 NOTE — ED Triage Notes (Signed)
Right arm pain x2 days.  Pain began in right upper back/shoulder area and is radiating down arm to wrist.  Pt is visibly uncomfortable , denies any injury or trauma.

## 2020-09-22 NOTE — ED Triage Notes (Signed)
Pt. Stated, Ive had pain in my rt. Shoulder going to the back and Ive had pain in the left chest area for 2 days.

## 2020-09-22 NOTE — Discharge Instructions (Addendum)
Overall suspect you have some inflammation in your right wrist and may be been in your chest wall.  Take steroids as prescribed.

## 2020-10-01 ENCOUNTER — Encounter: Payer: Self-pay | Admitting: Internal Medicine

## 2020-10-01 NOTE — Progress Notes (Signed)
Patient seen by [facility] Dr. Kasandra Knudsen on 09/21/2020.  Diagnosed with BPH with LUTS.  Stable and responded well to tamsulosin.  Elevated PSA.  Plan for prostate ultrasound biopsy on 10/27/2020.

## 2020-10-16 ENCOUNTER — Other Ambulatory Visit: Payer: Self-pay

## 2020-10-16 ENCOUNTER — Emergency Department (HOSPITAL_COMMUNITY)
Admission: EM | Admit: 2020-10-16 | Discharge: 2020-10-16 | Disposition: A | Discharge status: Home / Self Care | Payer: Medicare Other | Attending: Emergency Medicine | Admitting: Emergency Medicine

## 2020-10-16 DIAGNOSIS — N183 Chronic kidney disease, stage 3 unspecified: Secondary | ICD-10-CM | POA: Diagnosis not present

## 2020-10-16 DIAGNOSIS — Z79899 Other long term (current) drug therapy: Secondary | ICD-10-CM | POA: Insufficient documentation

## 2020-10-16 DIAGNOSIS — M5431 Sciatica, right side: Secondary | ICD-10-CM | POA: Insufficient documentation

## 2020-10-16 DIAGNOSIS — I129 Hypertensive chronic kidney disease with stage 1 through stage 4 chronic kidney disease, or unspecified chronic kidney disease: Secondary | ICD-10-CM | POA: Diagnosis not present

## 2020-10-16 DIAGNOSIS — J45909 Unspecified asthma, uncomplicated: Secondary | ICD-10-CM | POA: Diagnosis not present

## 2020-10-16 DIAGNOSIS — M7601 Gluteal tendinitis, right hip: Secondary | ICD-10-CM | POA: Diagnosis present

## 2020-10-16 MED ORDER — METHYLPREDNISOLONE 4 MG PO TBPK: ORAL_TABLET | ORAL | 0 refills | Status: AC

## 2020-10-16 MED ORDER — METHYLPREDNISOLONE 4 MG PO TBPK: ORAL_TABLET | ORAL | 0 refills | Status: DC

## 2020-10-16 MED ORDER — LIDOCAINE 5 % EX PTCH: 1.0000 | MEDICATED_PATCH | CUTANEOUS | 0 refills | Status: AC

## 2020-10-16 MED ORDER — LIDOCAINE 5 % EX PTCH
1.0000 | MEDICATED_PATCH | CUTANEOUS | Status: DC
  Administered 2020-10-16: 1 via TRANSDERMAL
  Filled 2020-10-16: qty 1

## 2020-10-16 MED ORDER — HYDROCODONE-ACETAMINOPHEN 5-325 MG PO TABS
1.0000 | ORAL_TABLET | Freq: Once | ORAL | Status: AC
  Administered 2020-10-16: 1 via ORAL
  Filled 2020-10-16: qty 1

## 2020-10-16 NOTE — ED Provider Notes (Signed)
[facility] EMERGENCY DEPARTMENT Provider Note   CSN: [id number] Arrival date & time: 10/16/20  6270     History Chief Complaint  Patient presents with  . Leg Pain    Bassel Eltom Kerel Zeiders is a 70 y.o. male.  70 year old male brought in by EMS from home with history of sciatica, hypertension with complaint of pain in the right buttock that radiates down the posterior right leg to the ankle, constant, worse with palpation and movement, has not taken anything at home for pain. Denies leg weakness, abdominal pain, groin numbness, fevers, changes in bowel or bladder habits. No falls or injuries. No other complaints or concerns.         Past Medical History:  Diagnosis Date  . Asthma   . Chronic back pain   . Hypertension   . Sciatica     Patient Active Problem List   Diagnosis Date Noted  . Mixed hyperlipidemia 04/29/2020  . Osteoarthritis of both knees 05/23/2017  . CKD (chronic kidney disease) stage 3, GFR 30-59 ml/min (HCC) 05/23/2017  . Positive depression screening 05/23/2017  . Benign prostatic hyperplasia with urinary frequency 03/28/2017  . Lumbar radiculopathy 03/28/2017  . Chronic neck pain 03/28/2017  . Osteoarthritis of spine with radiculopathy, lumbar region 02/28/2016  . Language barrier affecting health care 02/28/2016  . Abnormal PSA 02/28/2016  . Essential hypertension 07/25/2014  . Family history of diabetes mellitus (DM) 07/25/2014  . Rhinitis, allergic 07/25/2014    No past surgical history on file.     Family History  Problem Relation Age of Onset  . Hyperlipidemia Father   . Cancer Brother   . Diabetes Paternal Uncle     Social History   Tobacco Use  . Smoking status: Never Smoker  . Smokeless tobacco: Never Used  Vaping Use  . Vaping Use: Never used  Substance Use Topics  . Alcohol use: No    Alcohol/week: 0.0 standard drinks  . Drug use: No    Home Medications Prior to Admission medications    Medication Sig Start Date End Date Taking? Authorizing Provider  acetaminophen (TYLENOL) 500 MG tablet Take 1 tablet (500 mg total) by mouth 2 (two) times daily as needed for moderate pain. 04/28/20   Marcine Matar, MD  amLODipine (NORVASC) 10 MG tablet TAKE 1 TABLET (10 MG TOTAL) BY MOUTH DAILY. 04/28/20 04/28/21  Marcine Matar, MD  atorvastatin (LIPITOR) 10 MG tablet Take 1 tablet (10 mg total) by mouth daily. 04/29/20   Marcine Matar, MD  diclofenac Sodium (VOLTAREN) 1 % GEL APPLY 4 G TOPICALLY 4 (FOUR) TIMES DAILY. 04/28/20 04/28/21  Marcine Matar, MD  lidocaine (LIDODERM) 5 % Place 1 patch onto the skin daily. Remove & Discard patch within 12 hours or as directed by MD 10/16/20   Jeannie Fend, PA-C  methylPREDNISolone (MEDROL DOSEPAK) 4 MG TBPK tablet Follow package insert 10/16/20   Jeannie Fend, PA-C  tamsulosin (FLOMAX) 0.4 MG CAPS capsule TAKE 1 CAPSULE (0.4 MG TOTAL) BY MOUTH DAILY. 04/28/20 04/28/21  Marcine Matar, MD    Allergies    Patient has no known allergies.  Review of Systems   Review of Systems  Constitutional: Negative for fever.  Gastrointestinal: Negative for abdominal pain, nausea and vomiting.  Genitourinary: Negative for decreased urine volume and difficulty urinating.  Musculoskeletal: Positive for back pain and gait problem.  Skin: Negative for rash and wound.  Allergic/Immunologic: Negative for immunocompromised state.  Neurological:  Negative for weakness and numbness.  Psychiatric/Behavioral: Negative for confusion.  All other systems reviewed and are negative.   Physical Exam Updated Vital Signs BP (!) 156/90 (BP Location: Right Arm)   Pulse 89   Temp 98.5 F (36.9 C) (Oral)   Resp 18   SpO2 98%   Physical Exam Vitals and nursing note reviewed.  Constitutional:      General: He is not in acute distress.    Appearance: He is well-developed. He is not diaphoretic.  HENT:     Head: Normocephalic and atraumatic.   Cardiovascular:     Pulses: Normal pulses.  Pulmonary:     Effort: Pulmonary effort is normal.     Breath sounds: Normal breath sounds.  Abdominal:     Palpations: Abdomen is soft.     Tenderness: There is no abdominal tenderness.  Musculoskeletal:        General: Tenderness present.     Cervical back: No tenderness or bony tenderness.     Thoracic back: No tenderness or bony tenderness.     Lumbar back: No tenderness or bony tenderness. Positive right straight leg raise test. Negative left straight leg raise test.       Back:     Right lower leg: No edema.     Left lower leg: No edema.  Skin:    General: Skin is warm and dry.     Findings: No erythema or rash.  Neurological:     Mental Status: He is alert and oriented to person, place, and time.     Sensory: Sensation is intact.     Deep Tendon Reflexes: Babinski sign absent on the right side. Babinski sign absent on the left side.     Reflex Scores:      Patellar reflexes are 1+ on the right side and 1+ on the left side. Psychiatric:        Behavior: Behavior normal.     ED Results / Procedures / Treatments   Labs (all labs ordered are listed, but only abnormal results are displayed) Labs Reviewed - No data to display  EKG None  Radiology No results found.  Procedures Procedures   Medications Ordered in ED Medications  lidocaine (LIDODERM) 5 % 1 patch (1 patch Transdermal Patch Applied 10/16/20 0842)  HYDROcodone-acetaminophen (NORCO/VICODIN) 5-325 MG per tablet 1 tablet (1 tablet Oral Given 10/16/20 6644)    ED Course  I have reviewed the triage vital signs and the nursing notes.  Pertinent labs & imaging results that were available during my care of the patient were reviewed by me and considered in my medical decision making (see chart for details).  Clinical Course as of 10/16/20 1052  Mon Oct 16, 2020  1080 70 year old male with history of sciatica presents by EMS for pain in his right buttock radiating  down the right leg posteriorly without fall or injury. On exam, tenderness palpation right SI joint, leg strength and reflexes symmetric, DP pulses present, sensation intact. Straight leg raise positive with pain on right with right leg extension. Patient was given Norco and Lidoderm patch in the emergency room, discharged with topical diclofenac and Lidoderm patches and referred to PCP for follow-up for further evaluation and possible PT referral. [LM]    Clinical Course User Index [LM] Alden Hipp   MDM Rules/Calculators/A&P                          Final Clinical  Impression(s) / ED Diagnoses Final diagnoses:  Sciatica of right side    Rx / DC Orders ED Discharge Orders         Ordered    methylPREDNISolone (MEDROL DOSEPAK) 4 MG TBPK tablet  Status:  Discontinued        10/16/20 0910    lidocaine (LIDODERM) 5 %  Every 24 hours        10/16/20 0910    methylPREDNISolone (MEDROL DOSEPAK) 4 MG TBPK tablet        10/16/20 0911           Jeannie Fend, PA-C 10/16/20 1053    Rolan Bucco, MD 10/16/20 1526

## 2020-10-16 NOTE — ED Triage Notes (Signed)
Pt BIBA from home with lower back pain that shoots down his right leg. No trauma. VSS.

## 2020-10-16 NOTE — Discharge Instructions (Addendum)
Warm compresses to area for 20 minutes at a time followed by gentle stretching. Apply lidoderm to area. Take Prednisone as prescribed and complete the full course. Follow up for recheck.  You may benefit from physical therapy.

## 2020-11-14 ENCOUNTER — Ambulatory Visit: Payer: Medicare Other | Admitting: Internal Medicine

## 2021-08-11 ENCOUNTER — Emergency Department (HOSPITAL_COMMUNITY): Payer: Medicare Other

## 2021-08-11 ENCOUNTER — Encounter (HOSPITAL_COMMUNITY): Payer: Self-pay | Admitting: Emergency Medicine

## 2021-08-11 ENCOUNTER — Emergency Department (HOSPITAL_COMMUNITY)
Admission: EM | Admit: 2021-08-11 | Discharge: 2021-08-11 | Disposition: A | Discharge status: Home / Self Care | Payer: Medicare Other | Attending: Emergency Medicine | Admitting: Emergency Medicine

## 2021-08-11 ENCOUNTER — Other Ambulatory Visit: Payer: Self-pay

## 2021-08-11 DIAGNOSIS — N183 Chronic kidney disease, stage 3 unspecified: Secondary | ICD-10-CM | POA: Diagnosis not present

## 2021-08-11 DIAGNOSIS — R1013 Epigastric pain: Secondary | ICD-10-CM | POA: Diagnosis present

## 2021-08-11 DIAGNOSIS — K8020 Calculus of gallbladder without cholecystitis without obstruction: Secondary | ICD-10-CM | POA: Insufficient documentation

## 2021-08-11 DIAGNOSIS — I129 Hypertensive chronic kidney disease with stage 1 through stage 4 chronic kidney disease, or unspecified chronic kidney disease: Secondary | ICD-10-CM | POA: Insufficient documentation

## 2021-08-11 DIAGNOSIS — Z79899 Other long term (current) drug therapy: Secondary | ICD-10-CM | POA: Insufficient documentation

## 2021-08-11 DIAGNOSIS — Z20822 Contact with and (suspected) exposure to covid-19: Secondary | ICD-10-CM | POA: Insufficient documentation

## 2021-08-11 DIAGNOSIS — J45909 Unspecified asthma, uncomplicated: Secondary | ICD-10-CM | POA: Insufficient documentation

## 2021-08-11 DIAGNOSIS — R1011 Right upper quadrant pain: Secondary | ICD-10-CM

## 2021-08-11 LAB — CBC WITH DIFFERENTIAL/PLATELET
Abs Immature Granulocytes: 0.03 K/uL (ref 0.00–0.07)
Basophils Absolute: 0.1 K/uL (ref 0.0–0.1)
Basophils Relative: 1 %
Eosinophils Absolute: 0.4 K/uL (ref 0.0–0.5)
Eosinophils Relative: 7 %
HCT: 42.4 % (ref 39.0–52.0)
Hemoglobin: 13.9 g/dL (ref 13.0–17.0)
Immature Granulocytes: 1 %
Lymphocytes Relative: 43 %
Lymphs Abs: 2.5 K/uL (ref 0.7–4.0)
MCH: 26.3 pg (ref 26.0–34.0)
MCHC: 32.8 g/dL (ref 30.0–36.0)
MCV: 80.3 fL (ref 80.0–100.0)
Monocytes Absolute: 0.4 K/uL (ref 0.1–1.0)
Monocytes Relative: 8 %
Neutro Abs: 2.3 K/uL (ref 1.7–7.7)
Neutrophils Relative %: 40 %
Platelets: 197 K/uL (ref 150–400)
RBC: 5.28 MIL/uL (ref 4.22–5.81)
RDW: 13.2 % (ref 11.5–15.5)
WBC: 5.6 K/uL (ref 4.0–10.5)
nRBC: 0 % (ref 0.0–0.2)

## 2021-08-11 LAB — COMPREHENSIVE METABOLIC PANEL WITH GFR
ALT: 20 U/L (ref 0–44)
AST: 24 U/L (ref 15–41)
Albumin: 3.5 g/dL (ref 3.5–5.0)
Alkaline Phosphatase: 85 U/L (ref 38–126)
Anion gap: 11 (ref 5–15)
BUN: 22 mg/dL (ref 8–23)
CO2: 20 mmol/L — ABNORMAL LOW (ref 22–32)
Calcium: 9 mg/dL (ref 8.9–10.3)
Chloride: 105 mmol/L (ref 98–111)
Creatinine, Ser: 1.84 mg/dL — ABNORMAL HIGH (ref 0.61–1.24)
GFR, Estimated: 38 mL/min — ABNORMAL LOW
Glucose, Bld: 115 mg/dL — ABNORMAL HIGH (ref 70–99)
Potassium: 3.9 mmol/L (ref 3.5–5.1)
Sodium: 136 mmol/L (ref 135–145)
Total Bilirubin: 0.4 mg/dL (ref 0.3–1.2)
Total Protein: 6.8 g/dL (ref 6.5–8.1)

## 2021-08-11 LAB — RESP PANEL BY RT-PCR (FLU A&B, COVID) ARPGX2
Influenza A by PCR: NEGATIVE
Influenza B by PCR: NEGATIVE
SARS Coronavirus 2 by RT PCR: NEGATIVE

## 2021-08-11 LAB — LIPASE, BLOOD: Lipase: 40 U/L (ref 11–51)

## 2021-08-11 LAB — TROPONIN I (HIGH SENSITIVITY)
Troponin I (High Sensitivity): 5 ng/L
Troponin I (High Sensitivity): 5 ng/L (ref <18)

## 2021-08-11 MED ORDER — SODIUM CHLORIDE 0.9 % IV SOLN: 1000.0000 mL | INTRAVENOUS | Status: DC

## 2021-08-11 MED ORDER — SODIUM CHLORIDE 0.9 % IV BOLUS (SEPSIS)
1000.0000 mL | Freq: Once | INTRAVENOUS | Status: AC
  Administered 2021-08-11: 1000 mL via INTRAVENOUS

## 2021-08-11 MED ORDER — FENTANYL CITRATE PF 50 MCG/ML IJ SOSY
50.0000 ug | PREFILLED_SYRINGE | Freq: Once | INTRAMUSCULAR | Status: DC
  Filled 2021-08-11: qty 1

## 2021-08-11 MED ORDER — FENTANYL CITRATE PF 50 MCG/ML IJ SOSY
50.0000 ug | PREFILLED_SYRINGE | Freq: Once | INTRAMUSCULAR | Status: AC
  Administered 2021-08-11: 50 ug via INTRAVENOUS

## 2021-08-11 MED ORDER — LIDOCAINE VISCOUS HCL 2 % MT SOLN: 15.0000 mL | Freq: Once | OROMUCOSAL | Status: DC

## 2021-08-11 MED ORDER — ALUM & MAG HYDROXIDE-SIMETH 200-200-20 MG/5ML PO SUSP: 30.0000 mL | Freq: Once | ORAL | Status: DC

## 2021-08-11 MED ORDER — SODIUM CHLORIDE 0.9 % IV BOLUS (SEPSIS): 1000.0000 mL | Freq: Once | INTRAVENOUS | Status: DC

## 2021-08-11 NOTE — Discharge Instructions (Signed)
Please call to follow-up with general surgery for further management of your gallstones in your gallbladder.  If any symptoms change or worsen, please give them a call and if it severely worsens, please return to the nearest emergency department.

## 2021-08-11 NOTE — ED Provider Triage Note (Signed)
Emergency Medicine Provider Triage Evaluation Note  Jontay Eltom Jl Mandler , a 71 y.o. male  was evaluated in triage.  Pt complains of epigastric pain radiating to the back which has been constant for 2+ hour. No medications PTA. Denies any known modifying factors as well as associated V/D, fever, lower extremity numbness or paresthesias. Instead, reports some pain in b/l knees. Denies ETOH use, drug use, hx of CAD.  Review of Systems  Positive: As above Negative: As above  Physical Exam  BP (!) 168/85    Pulse 89    Temp 98 F (36.7 C) (Oral)    Resp 18    SpO2 98%  Gen:   Awake, appears uncomfortable; moaning Resp:  Normal effort. Lungs grossly CTAB. MSK:   Moves extremities without difficulty  Other:  No BLE edema. There is epigastric and RUQ TTP without involuntary guarding. Abdomen is soft, obese.  Medical Decision Making  Medically screening exam initiated at 3:38 AM.  Appropriate orders placed.  Claude Eltom Elmahadi Neyra was informed that the remainder of the evaluation will be completed by another provider, this initial triage assessment does not replace that evaluation, and the importance of remaining in the ED until their evaluation is complete.  Epigastric pain - work up initiated   Antony Madura, [place] 08/11/21 0345

## 2021-08-11 NOTE — ED Provider Notes (Signed)
[facility] EMERGENCY DEPARTMENT Provider Note  CSN: [id number] Arrival date & time: 08/11/21 [id number]  Chief Complaint(s) Epigastric Pain / Back Pain   HPI Tyrone Mcdonald is a 71 y.o. male with a history of hypertension and CKD who presents to the emergency department with sudden onset epigastric abdominal pain that began 2 hours prior to arrival.  Pain migrated to the right upper quadrant.  Worse with palpation and ambulation.  Endorses nausea without emesis.  No recent fevers or infections.  No associated chest pain or shortness of breath.  HPI  Past Medical History Past Medical History:  Diagnosis Date   Asthma    Chronic back pain    Hypertension    Sciatica    Patient Active Problem List   Diagnosis Date Noted   Mixed hyperlipidemia 04/29/2020   Osteoarthritis of both knees 05/23/2017   CKD (chronic kidney disease) stage 3, GFR 30-59 ml/min (HCC) 05/23/2017   Positive depression screening 05/23/2017   Benign prostatic hyperplasia with urinary frequency 03/28/2017   Lumbar radiculopathy 03/28/2017   Chronic neck pain 03/28/2017   Osteoarthritis of spine with radiculopathy, lumbar region 02/28/2016   Language barrier affecting health care 02/28/2016   Abnormal PSA 02/28/2016   Essential hypertension 07/25/2014   Family history of diabetes mellitus (DM) 07/25/2014   Rhinitis, allergic 07/25/2014   Home Medication(s) Prior to Admission medications   Medication Sig Start Date End Date Taking? Authorizing Provider  acetaminophen (TYLENOL) 500 MG tablet Take 1 tablet (500 mg total) by mouth 2 (two) times daily as needed for moderate pain. 04/28/20   Ladell Pier, MD  amLODipine (NORVASC) 10 MG tablet TAKE 1 TABLET (10 MG TOTAL) BY MOUTH DAILY. 04/28/20 04/28/21  Ladell Pier, MD  atorvastatin (LIPITOR) 10 MG tablet Take 1 tablet (10 mg total) by mouth daily. 04/29/20   Ladell Pier, MD  lidocaine (LIDODERM) 5 % Place 1 patch onto  the skin daily. Remove & Discard patch within 12 hours or as directed by MD 10/16/20   Tacy Learn, PA-C  methylPREDNISolone (MEDROL DOSEPAK) 4 MG TBPK tablet Follow package insert 10/16/20   Tacy Learn, PA-C                                                                                                                                    Allergies Patient has no known allergies.  Review of Systems Review of Systems As noted in HPI  Physical Exam Vital Signs  I have reviewed the triage vital signs BP (!) 159/78    Pulse 79    Temp 98 F (36.7 C) (Oral)    Resp 20    SpO2 100%   Physical Exam Vitals reviewed.  Constitutional:      General: He is not in acute distress.    Appearance: He is well-developed. He is not diaphoretic.  HENT:     Head:  Normocephalic and atraumatic.     Right Ear: External ear normal.     Left Ear: External ear normal.     Nose: Nose normal.     Mouth/Throat:     Mouth: Mucous membranes are moist.  Eyes:     General: No scleral icterus.    Conjunctiva/sclera: Conjunctivae normal.  Neck:     Trachea: Phonation normal.  Cardiovascular:     Rate and Rhythm: Normal rate and regular rhythm.  Pulmonary:     Effort: Pulmonary effort is normal. No respiratory distress.     Breath sounds: No stridor.  Abdominal:     General: There is no distension.     Tenderness: There is abdominal tenderness in the right upper quadrant. Positive signs include Murphy's sign.  Musculoskeletal:        General: Normal range of motion.     Cervical back: Normal range of motion.  Neurological:     Mental Status: He is alert and oriented to person, place, and time.  Psychiatric:        Behavior: Behavior normal.    ED Results and Treatments Labs (all labs ordered are listed, but only abnormal results are displayed) Labs Reviewed  COMPREHENSIVE METABOLIC PANEL - Abnormal; Notable for the following components:      Result Value   CO2 20 (*)    Glucose, Bld 115 (*)     Creatinine, Ser 1.84 (*)    GFR, Estimated 38 (*)    All other components within normal limits  RESP PANEL BY RT-PCR (FLU A&B, COVID) ARPGX2  CBC WITH DIFFERENTIAL/PLATELET  LIPASE, BLOOD  TROPONIN I (HIGH SENSITIVITY)  TROPONIN I (HIGH SENSITIVITY)                                                                                                                         EKG  EKG Interpretation  Date/Time:  Saturday August 11 2021 03:42:37 EST Ventricular Rate:  85 PR Interval:  138 QRS Duration: 72 QT Interval:  364 QTC Calculation: 433 R Axis:   40 Text Interpretation: Normal sinus rhythm Normal ECG When compared with ECG of 22-Sep-2020 16:11, PREVIOUS ECG IS PRESENT Confirmed by Addison Lank [phone]) on 08/11/2021 6:33:56 AM       Radiology DG Chest 2 View  Result Date: 08/11/2021 CLINICAL DATA:  Chest pain. EXAM: CHEST - 2 VIEW COMPARISON:  09/22/2020. FINDINGS: The heart size and mediastinal contours are within normal limits. Both lungs are clear. Degenerative changes are present in the thoracic spine. IMPRESSION: No active cardiopulmonary disease. Electronically Signed   By: Brett Fairy M.D.   On: 08/11/2021 04:15   US Abdomen Limited  Result Date: 08/11/2021 CLINICAL DATA:  71 year old male with right upper quadrant pain. EXAM: ULTRASOUND ABDOMEN LIMITED RIGHT UPPER QUADRANT COMPARISON:  None. FINDINGS: Gallbladder: Shadowing echogenic gallstones, individually up to 24 mm (image 9) with evidence of superimposed tumefactive sludge. However, gallbladder wall thickness remains within normal limits at 2-3 mm. No  pericholecystic fluid identified, but positive sonographic Percell Miller sign is reported. Common bile duct: Diameter: 4 mm, normal. Liver: Liver echogenicity appears mildly increased. No intrahepatic biliary ductal dilatation. No discrete liver lesion. Portal vein is patent on color Doppler imaging with normal direction of blood flow towards the liver. Other: Visible right kidney  remarkable for an oval, mildly lobulated 5.6 cm upper pole cystic lesion. No vascular elements on brief Doppler (image 34). No right hydronephrosis. IMPRESSION: 1. Positive for gallstones and tumefactive sludge, with positive sonographic Murphy sign suspicious for Acute Cholecystitis despite absent wall thickening. 2. No evidence of bile duct obstruction. Possible fatty liver disease. 3. Right renal upper pole 5.6 cm cystic lesion most compatible with a benign cyst. Electronically Signed   By: Genevie Ann M.D.   On: 08/11/2021 04:57    Pertinent labs & imaging results that were available during my care of the patient were reviewed by me and considered in my medical decision making (see MDM for details).  Medications Ordered in ED Medications  sodium chloride 0.9 % bolus 1,000 mL (1,000 mLs Intravenous New Bag/Given 08/11/21 [id number])    Followed by  0.9 %  sodium chloride infusion (has no administration in time range)  fentaNYL (SUBLIMAZE) injection 50 mcg (50 mcg Intravenous Given 08/11/21 [id number])                                                                                                                                     Procedures Procedures  (including critical care time)  Medical Decision Making / ED Course        RUQ/epigastric pain Most suspicious for GI related process including biliary disease, pancreatitis or gastritis. Given age and comorbidities, will also assess for cardiac process.  Work-up ordered to assess concerns above.  Labs and imaging independently interpreted by me and noted below: EKG without acute ischemic changes or evidence of pericarditis  Initial troponin negative  CBC without leukocytosis or anemia Renal function stable.  No significant electrolyte derangements.  No evidence of bili obstruction or pancreatitis. On chest x-ray, there was no evidence suggestive of pneumonia, pneumothorax, pneumomediastinum, pulmonary edema concerning for new or exacerbation of heart  failure, abnormal contour of the mediastinum to suggest dissection. Right upper quadrant notable for gallstones without pericholecystic fluid or wall thickening the patient has positive Murphy sign on my exam and on ultrasound. Given source of pain is likely GB - doubt cardiac process.  Management: IVF IV fentanyl Case discussed with Dr. Redmond Pulling from general surgery who will arrange for patient to be seen by the morning team to determine whether patient will have emergent cholecystectomy or if he will follow-up in clinic for elective surgery.   Patient care turned over to oncoming provider. Patient case and results discussed in detail; please see their note for further ED managment.    Final Clinical Impression(s) / ED Diagnoses Final diagnoses:  Gallstones  RUQ pain  This chart was dictated using voice recognition software.  Despite best efforts to proofread,  errors can occur which can change the documentation meaning.    Fatima Blank, MD 08/11/21 [phone]

## 2021-08-11 NOTE — Progress Notes (Signed)
Tyrone Mcdonald [birth year]  [id number].    Requesting MD: Eudelia Bunch, MD Chief Complaint/Reason for Consult: RUQ pain, cholelithiasis   HPI: arabic video interpreter used. 71 y/o M with a PMH HTN and DM who presented with acute onset abdominal pain. States he developed epigastric pain that radiated to his RUQ around midnight and decided to come to the ED at 0100. Did not take medication for the pain at home. Denies associated fever, chills, nausea, vomiting, diarrhea. Denies similar pain in the past. In the ED he tells me that his pain has now subsided. Reports eating a diet with a lot of oil, cheese, and spicy foods. Denies known history of gallstones.  Surgical history: R nephrectomy for kidney stones 1982, performed in [place].  Social history: lives in [place], speaks arabic, originally from [place]. Denies tobacco or alcohol use. Blood thinning medications: none   ROS: Review of Systems  All other systems reviewed and are negative.  Family History  Problem Relation Age of Onset   Hyperlipidemia Father    Cancer Brother    Diabetes Paternal Uncle     Past Medical History:  Diagnosis Date   Asthma    Chronic back pain    Hypertension    Sciatica     History reviewed. No pertinent surgical history.  Social History:  reports that he has never smoked. He has never used smokeless tobacco. He reports that he does not drink alcohol and does not use drugs.  Allergies: No Known Allergies  (Not in a hospital admission)    Physical Exam: Blood pressure (!) 146/79, pulse 62, temperature 98 F (36.7 C), temperature source Oral, resp. rate 20, SpO2 100 %. General: Pleasant male laying on hospital bed, appears stated age, NAD. HEENT: head -normocephalic, atraumatic; Eyes: PERRLA, no conjunctival injection;  Neck- Trachea is midline, no thyromegaly CV- RRR, normal S1/S2, no M/R/G, radial and dorsalis pedis pulses 2+ BL, cap refill < 2 seconds. Pulm- breathing  is non-labored. CTABL, no wheezes, rhales, rhonchi. Abd- soft, NT/ND, appropriate bowel sounds in 4 quadrants, no masses, hernias, or organomegaly. Surgical scar over R posterior flank. GU- deferred  MSK- UE/LE symmetrical, no cyanosis, clubbing, or edema. Neuro- CN II-XII grossly in tact, no paresthesias. Psych- Alert and Oriented x3 with appropriate affect Skin: warm and dry, no rashes or lesions   Results for orders placed or performed during the hospital encounter of 08/11/21 (from the past 48 hour(s))  CBC with Differential     Status: None   Collection Time: 08/11/21  3:48 AM  Result Value Ref Range   WBC 5.6 4.0 - 10.5 K/uL   RBC 5.28 4.22 - 5.81 MIL/uL   Hemoglobin 13.9 13.0 - 17.0 g/dL   HCT 40.9 81.1 - 91.4 %   MCV 80.3 80.0 - 100.0 fL   MCH 26.3 26.0 - 34.0 pg   MCHC 32.8 30.0 - 36.0 g/dL   RDW 78.2 95.6 - 21.3 %   Platelets 197 150 - 400 K/uL   nRBC 0.0 0.0 - 0.2 %   Neutrophils Relative % 40 %   Neutro Abs 2.3 1.7 - 7.7 K/uL   Lymphocytes Relative 43 %   Lymphs Abs 2.5 0.7 - 4.0 K/uL   Monocytes Relative 8 %   Monocytes Absolute 0.4 0.1 - 1.0 K/uL   Eosinophils Relative 7 %   Eosinophils Absolute 0.4 0.0 - 0.5 K/uL   Basophils Relative 1 %   Basophils Absolute 0.1 0.0 - 0.1  K/uL   Immature Granulocytes 1 %   Abs Immature Granulocytes 0.03 0.00 - 0.07 K/uL    Comment: Performed at [facility], [address] [place], [place] [address]  Comprehensive metabolic panel     Status: Abnormal   Collection Time: 08/11/21  3:48 AM  Result Value Ref Range   Sodium 136 135 - 145 mmol/L   Potassium 3.9 3.5 - 5.1 mmol/L   Chloride 105 98 - 111 mmol/L   CO2 20 (L) 22 - 32 mmol/L   Glucose, Bld 115 (H) 70 - 99 mg/dL    Comment: Glucose reference range applies only to samples taken after fasting for at least 8 hours.   BUN 22 8 - 23 mg/dL   Creatinine, Ser 7.12 (H) 0.61 - 1.24 mg/dL   Calcium 9.0 8.9 - 19.7 mg/dL   Total Protein 6.8 6.5 - 8.1 g/dL   Albumin 3.5  3.5 - 5.0 g/dL   AST 24 15 - 41 U/L   ALT 20 0 - 44 U/L   Alkaline Phosphatase 85 38 - 126 U/L   Total Bilirubin 0.4 0.3 - 1.2 mg/dL   GFR, Estimated 38 (L) >60 mL/min    Comment: (NOTE) Calculated using the CKD-EPI Creatinine Equation (2021)    Anion gap 11 5 - 15    Comment: Performed at [facility], [address] [place], [place] [address]  Lipase, blood     Status: None   Collection Time: 08/11/21  3:48 AM  Result Value Ref Range   Lipase 40 11 - 51 U/L    Comment: Performed at [facility], [address] [place], [place] [address]  Troponin I (High Sensitivity)     Status: None   Collection Time: 08/11/21  3:48 AM  Result Value Ref Range   Troponin I (High Sensitivity) 5 <18 ng/L    Comment: (NOTE) Elevated high sensitivity troponin I (hsTnI) values and significant  changes across serial measurements may suggest ACS but many other  chronic and acute conditions are known to elevate hsTnI results.  Refer to the "Links" section for chest pain algorithms and additional  guidance. Performed at [facility], [address], [place], [place] [address]   Resp Panel by RT-PCR (Flu A&B, Covid) Nasopharyngeal Swab     Status: None   Collection Time: 08/11/21  6:05 AM   Specimen: Nasopharyngeal Swab; Nasopharyngeal(NP) swabs in vial transport medium  Result Value Ref Range   SARS Coronavirus 2 by RT PCR NEGATIVE NEGATIVE    Comment: (NOTE) SARS-CoV-2 target nucleic acids are NOT DETECTED.  The SARS-CoV-2 RNA is generally detectable in upper respiratory specimens during the acute phase of infection. The lowest concentration of SARS-CoV-2 viral copies this assay can detect is 138 copies/mL. A negative result does not preclude SARS-Cov-2 infection and should not be used as the sole basis for treatment or other patient management decisions. A negative result may occur with  improper specimen collection/handling, submission of specimen other than nasopharyngeal  swab, presence of viral mutation(s) within the areas targeted by this assay, and inadequate number of viral copies(<138 copies/mL). A negative result must be combined with clinical observations, patient history, and epidemiological information. The expected result is Negative.  Fact Sheet for Patients:  BloggerCourse.com  Fact Sheet for Healthcare Providers:  SeriousBroker.it  This test is no t yet approved or cleared by the [place] FDA and  has been authorized for detection and/or diagnosis of SARS-CoV-2  by FDA under an Emergency Use Authorization (EUA). This EUA will remain  in effect (meaning this test can be used) for the duration of the COVID-19 declaration under Section 564(b)(1) of the Act, 21 U.S.C.section 360bbb-3(b)(1), unless the authorization is terminated  or revoked sooner.       Influenza A by PCR NEGATIVE NEGATIVE   Influenza B by PCR NEGATIVE NEGATIVE    Comment: (NOTE) The Xpert Xpress SARS-CoV-2/FLU/RSV plus assay is intended as an aid in the diagnosis of influenza from Nasopharyngeal swab specimens and should not be used as a sole basis for treatment. Nasal washings and aspirates are unacceptable for Xpert Xpress SARS-CoV-2/FLU/RSV testing.  Fact Sheet for Patients: BloggerCourse.com  Fact Sheet for Healthcare Providers: SeriousBroker.it  This test is not yet approved or cleared by the [place] FDA and has been authorized for detection and/or diagnosis of SARS-CoV-2 by FDA under an Emergency Use Authorization (EUA). This EUA will remain in effect (meaning this test can be used) for the duration of the COVID-19 declaration under Section 564(b)(1) of the Act, 21 U.S.C. section 360bbb-3(b)(1), unless the authorization is terminated or revoked.  Performed at [facility], [address] [place], [place] [address]   Troponin I (High  Sensitivity)     Status: None   Collection Time: 08/11/21  6:12 AM  Result Value Ref Range   Troponin I (High Sensitivity) 5 <18 ng/L    Comment: (NOTE) Elevated high sensitivity troponin I (hsTnI) values and significant  changes across serial measurements may suggest ACS but many other  chronic and acute conditions are known to elevate hsTnI results.  Refer to the "Links" section for chest pain algorithms and additional  guidance. Performed at [facility], [address], [place], [place] [address]    DG Chest 2 View  Result Date: 08/11/2021 CLINICAL DATA:  Chest pain. EXAM: CHEST - 2 VIEW COMPARISON:  09/22/2020. FINDINGS: The heart size and mediastinal contours are within normal limits. Both lungs are clear. Degenerative changes are present in the thoracic spine. IMPRESSION: No active cardiopulmonary disease. Electronically Signed   By: Thornell Sartorius M.D.   On: 08/11/2021 04:15   US Abdomen Limited  Result Date: 08/11/2021 CLINICAL DATA:  71 year old male with right upper quadrant pain. EXAM: ULTRASOUND ABDOMEN LIMITED RIGHT UPPER QUADRANT COMPARISON:  None. FINDINGS: Gallbladder: Shadowing echogenic gallstones, individually up to 24 mm (image 9) with evidence of superimposed tumefactive sludge. However, gallbladder wall thickness remains within normal limits at 2-3 mm. No pericholecystic fluid identified, but positive sonographic Eulah Pont sign is reported. Common bile duct: Diameter: 4 mm, normal. Liver: Liver echogenicity appears mildly increased. No intrahepatic biliary ductal dilatation. No discrete liver lesion. Portal vein is patent on color Doppler imaging with normal direction of blood flow towards the liver. Other: Visible right kidney remarkable for an oval, mildly lobulated 5.6 cm upper pole cystic lesion. No vascular elements on brief Doppler (image 34). No right hydronephrosis. IMPRESSION: 1. Positive for gallstones and tumefactive sludge, with positive sonographic Murphy sign  suspicious for Acute Cholecystitis despite absent wall thickening. 2. No evidence of bile duct obstruction. Possible fatty liver disease. 3. Right renal upper pole 5.6 cm cystic lesion most compatible with a benign cyst. Electronically Signed   By: Odessa Fleming M.D.   On: 08/11/2021 04:57      Assessment/Plan Symptomatic cholelithiasis without cholecystitis RUQ and epigastric pain were acute onset, now resolved in ED. Nontender on exam. Normal labs. RUQ U/S w/ cholelithiasis and  no signs of cholecystitis. Recommend low fat diet and follow up in our office if sxs recur. Discussed pathophysiology of gallbladder disease with the patient and advised him to come back to the ED if he develops similar pain associated with  fever, significant vomiting, jaundice, or inability to tolerate PO. Stable for discharge from [facility] perspective, no urgent surgical needs.  High Medical Decision Making  Adam Phenix, [facility] Surgery 08/11/2021, 8:59 AM Please see Amion for pager number during day hours 7:00am-4:30pm or 7:00am -11:30am on weekends

## 2021-08-11 NOTE — ED Provider Notes (Signed)
Care assumed from Dr. Eudelia Bunchardama.  At time of transfer of care, patient is awaiting evaluation by general surgery to discuss further management of upper abdominal discomfort and concerning ultrasound findings.  General surgery saw patient and feel he is appropriate for discharge home and outpatient general surgery follow-up for likely symptomatic cholelithiasis.  Patient will be discharged for outpatient follow-up.     Tyrone Mcdonald, Canary Brimhristopher J, MD 08/11/21 [phone]

## 2021-08-11 NOTE — ED Triage Notes (Signed)
Patient reports epigastric and upper back pain onset this morning , no emesis or diarrhea .

## 2021-08-17 ENCOUNTER — Ambulatory Visit: Payer: Self-pay | Admitting: Surgery

## 2021-08-17 NOTE — H&P (Signed)
Subjective   Chief Complaint: Cholelithiasis   Arabic interpreter  History of Present Illness: Tyrone Mcdonald is a 71 y.o. male who is seen today as an office consultation at the request of Dr. Wynetta Emery for evaluation of Cholelithiasis .   71 y/o M with a PMH HTN and DM who presented with acute onset abdominal pain. States he developed epigastric pain that radiated to his RUQ around midnight and decided to come to the ED at 0100. Did not take medication for the pain at home. Denies associated fever, chills, nausea, vomiting, diarrhea. Denies similar pain in the past. In the ED he tells me that his pain has now subsided. Reports eating a diet with a lot of oil, cheese, and spicy foods. Denies known history of gallstones.  He was evaluated in the ED on 08/11/21 and was found to have gallstones.  LFT's WNL.  No sign of cholecystitis.  He was discharged home with close follow-up.  He has not had any symptoms since that time.     Review of Systems: A complete review of systems was obtained from the patient.  I have reviewed this information and discussed as appropriate with the patient.  See HPI as well for other ROS.  Review of Systems  Constitutional: Negative.   HENT: Negative.   Eyes: Negative.   Respiratory: Negative.   Cardiovascular: Negative.   Gastrointestinal: Positive for abdominal pain.  Genitourinary: Negative.   Musculoskeletal: Negative.   Skin: Negative.   Neurological: Negative.   Endo/Heme/Allergies: Negative.   Psychiatric/Behavioral: Negative.       Medical History: Past Medical History:  Diagnosis Date   Arthritis    Asthma, unspecified asthma severity, unspecified whether complicated, unspecified whether persistent    Diabetes mellitus without complication (CMS-HCC)    Hyperlipidemia    Hypertension     Patient Active Problem List  Diagnosis   Benign prostatic hyperplasia with urinary frequency   CKD (chronic kidney disease) stage 3, GFR 30-59  ml/min (CMS-HCC)   Essential hypertension    Past Surgical History:  Procedure Laterality Date   kidney stones       No Known Allergies  Current Outpatient Medications on File Prior to Visit  Medication Sig Dispense Refill   acetaminophen (TYLENOL) 500 MG tablet Take by mouth     atorvastatin (LIPITOR) 10 MG tablet Take 1 tablet by mouth once daily     lidocaine (LIDODERM) 5 % patch Place onto the skin     amLODIPine (NORVASC) 10 MG tablet Take 10 mg by mouth once daily     No current facility-administered medications on file prior to visit.    Family History  Problem Relation Age of Onset   High blood pressure (Hypertension) Paternal Grandmother    High blood pressure (Hypertension) Paternal Grandfather      Social History   Tobacco Use  Smoking Status Never  Smokeless Tobacco Never     Social History   Socioeconomic History   Marital status: Single  Tobacco Use   Smoking status: Never   Smokeless tobacco: Never  Vaping Use   Vaping Use: Never used  Substance and Sexual Activity   Alcohol use: Never   Drug use: Never    Objective:    Vitals:   08/17/21 1051  BP: (!) 142/90  Pulse: (!) 113  Temp: 36.3 C (97.3 F)  Weight: 82.1 kg (181 lb)     Physical Exam   Constitutional:  WDWN in NAD, conversant, no obvious deformities;  lying in bed comfortably Eyes:  Pupils equal, round; sclera anicteric; moist conjunctiva; no lid lag HENT:  Oral mucosa moist; good dentition  Neck:  No masses palpated, trachea midline; no thyromegaly Lungs:  CTA bilaterally; normal respiratory effort Breasts:  symmetric, no nipple changes; no palpable masses or lymphadenopathy on either side CV:  Regular rate and rhythm; no murmurs; extremities well-perfused with no edema Abd:  +bowel sounds, soft, non-tender, no palpable organomegaly; no palpable hernias Musc:  Unable to assess gait; no apparent clubbing or cyanosis in extremities Lymphatic:  No palpable cervical or axillary  lymphadenopathy Skin:  Warm, dry; no sign of jaundice Psychiatric - alert and oriented x 4; calm mood and affect   Labs, Imaging and Diagnostic Testing: CLINICAL DATA:  71 year old male with right upper quadrant pain.   EXAM: ULTRASOUND ABDOMEN LIMITED RIGHT UPPER QUADRANT   COMPARISON:  None.   FINDINGS: Gallbladder:   Shadowing echogenic gallstones, individually up to 24 mm (image 9) with evidence of superimposed tumefactive sludge. However, gallbladder wall thickness remains within normal limits at 2-3 mm. No pericholecystic fluid identified, but positive sonographic Percell Miller sign is reported.   Common bile duct:   Diameter: 4 mm, normal.   Liver:   Liver echogenicity appears mildly increased. No intrahepatic biliary ductal dilatation. No discrete liver lesion. Portal vein is patent on color Doppler imaging with normal direction of blood flow towards the liver.   Other: Visible right kidney remarkable for an oval, mildly lobulated 5.6 cm upper pole cystic lesion. No vascular elements on brief Doppler (image 34). No right hydronephrosis.   IMPRESSION: 1. Positive for gallstones and tumefactive sludge, with positive sonographic Murphy sign suspicious for Acute Cholecystitis despite absent wall thickening. 2. No evidence of bile duct obstruction. Possible fatty liver disease. 3. Right renal upper pole 5.6 cm cystic lesion most compatible with a benign cyst.     Electronically Signed   By: Genevie Ann M.D.   On: 08/11/2021 04:57  Assessment and Plan:  Diagnoses and all orders for this visit:  Calculus of gallbladder with chronic cholecystitis without obstruction     Recommend laparoscopic cholecystectomy with intraoperative cholangiogram.  The surgical procedure has been discussed with the patient.  Potential risks, benefits, alternative treatments, and expected outcomes have been explained.  All of the patient's questions at this time have been answered.  The  likelihood of reaching the patient's treatment goal is good.  The patient understand the proposed surgical procedure and wishes to proceed.   Carlean Jews, MD  08/17/2021 7:04 PM

## 2021-11-09 ENCOUNTER — Emergency Department (HOSPITAL_BASED_OUTPATIENT_CLINIC_OR_DEPARTMENT_OTHER): Payer: Medicare Other

## 2021-11-09 ENCOUNTER — Encounter (HOSPITAL_COMMUNITY): Payer: Self-pay

## 2021-11-09 ENCOUNTER — Emergency Department (HOSPITAL_COMMUNITY)
Admission: EM | Admit: 2021-11-09 | Discharge: 2021-11-09 | Disposition: A | Discharge status: Home / Self Care | Payer: Medicare Other | Attending: Emergency Medicine | Admitting: Emergency Medicine

## 2021-11-09 ENCOUNTER — Other Ambulatory Visit: Payer: Self-pay

## 2021-11-09 ENCOUNTER — Emergency Department (HOSPITAL_COMMUNITY): Payer: Medicare Other

## 2021-11-09 DIAGNOSIS — Z79899 Other long term (current) drug therapy: Secondary | ICD-10-CM | POA: Diagnosis not present

## 2021-11-09 DIAGNOSIS — M7989 Other specified soft tissue disorders: Secondary | ICD-10-CM | POA: Insufficient documentation

## 2021-11-09 DIAGNOSIS — M79604 Pain in right leg: Secondary | ICD-10-CM | POA: Insufficient documentation

## 2021-11-09 DIAGNOSIS — M79605 Pain in left leg: Secondary | ICD-10-CM | POA: Insufficient documentation

## 2021-11-09 DIAGNOSIS — I10 Essential (primary) hypertension: Secondary | ICD-10-CM | POA: Insufficient documentation

## 2021-11-09 DIAGNOSIS — R823 Hemoglobinuria: Secondary | ICD-10-CM | POA: Insufficient documentation

## 2021-11-09 DIAGNOSIS — E119 Type 2 diabetes mellitus without complications: Secondary | ICD-10-CM | POA: Diagnosis not present

## 2021-11-09 DIAGNOSIS — M545 Low back pain, unspecified: Secondary | ICD-10-CM | POA: Diagnosis present

## 2021-11-09 LAB — URINALYSIS, ROUTINE W REFLEX MICROSCOPIC
Bacteria, UA: NONE SEEN
Bilirubin Urine: NEGATIVE
Glucose, UA: NEGATIVE mg/dL
Ketones, ur: NEGATIVE mg/dL
Leukocytes,Ua: NEGATIVE
Nitrite: NEGATIVE
Protein, ur: NEGATIVE mg/dL
Specific Gravity, Urine: 1.01 (ref 1.005–1.030)
pH: 5 (ref 5.0–8.0)

## 2021-11-09 MED ORDER — PREDNISONE 20 MG PO TABS: 40.0000 mg | ORAL_TABLET | Freq: Every day | ORAL | 0 refills | Status: AC

## 2021-11-09 MED ORDER — TIZANIDINE HCL 2 MG PO TABS: 2.0000 mg | ORAL_TABLET | Freq: Two times a day (BID) | ORAL | 0 refills | Status: AC

## 2021-11-09 MED ORDER — IBUPROFEN 600 MG PO TABS: 600.0000 mg | ORAL_TABLET | Freq: Four times a day (QID) | ORAL | 0 refills | Status: AC | PRN

## 2021-11-09 NOTE — ED Triage Notes (Signed)
Pt arrives via from home. Pt c/o lower back pain and lower extremity swelling. Pt had recent trips out of the country. Pt AxOx4.  ?

## 2021-11-09 NOTE — ED Notes (Signed)
Patient transported to Ultrasound 

## 2021-11-09 NOTE — Progress Notes (Signed)
Lower extremity venous bilateral study completed. ? ?Preliminary results relayed to Stevie Kern, MD. ? ?See CV Proc for preliminary results report.  ? ?Jean Rosenthal, RDMS, RVT ? ?

## 2021-11-09 NOTE — ED Provider Notes (Signed)
?[facility] ?Provider Note ? ? ?CSN: [id number] ?Arrival date & time: 11/09/21  1620 ? ?  ? ?History ? ?Chief Complaint  ?Patient presents with  ? Back Pain  ? Leg Pain  ? ? ?Tyrone Mcdonald is a 71 y.o. male who presents to the emergency department complaining of chronic lower back pain.  Also complaining of bilateral leg swelling.  Patient notes that he has had recent trips out of the country.  He notes that he recently got back from [place] today at 5 AM prior to arrival.  Denies anticoagulants at this time.  No recent injury or trauma. Patient has a past medical history of hypertension and diabetes.  Has not tried any medications for his symptoms prior to arrival to the ED.  Denies gait problem, fever, chills, dysuria, hematuria, bowel/bladder incontinence, nausea, vomiting. ? ?The history is provided by the patient. A language interpreter was used (Arabic).  ? ?  ? ?Home Medications ?Prior to Admission medications   ?Medication Sig Start Date End Date Taking? Authorizing Provider  ?ibuprofen (ADVIL) 600 MG tablet Take 1 tablet (600 mg total) by mouth every 6 (six) hours as needed. 11/09/21  Yes Marcell Chavarin A, PA-C  ?predniSONE (DELTASONE) 20 MG tablet Take 2 tablets (40 mg total) by mouth daily for 5 days. 11/09/21 11/14/21 Yes Jkayla Spiewak A, PA-C  ?tiZANidine (ZANAFLEX) 2 MG tablet Take 1 tablet (2 mg total) by mouth 2 (two) times daily for 10 days. 11/09/21 11/19/21 Yes Naythen Heikkila A, PA-C  ?acetaminophen (TYLENOL) 500 MG tablet Take 1 tablet (500 mg total) by mouth 2 (two) times daily as needed for moderate pain. 04/28/20   Ladell Pier, MD  ?amLODipine (NORVASC) 10 MG tablet TAKE 1 TABLET (10 MG TOTAL) BY MOUTH DAILY. 04/28/20 04/28/21  Ladell Pier, MD  ?atorvastatin (LIPITOR) 10 MG tablet Take 1 tablet (10 mg total) by mouth daily. 04/29/20   Ladell Pier, MD  ?lidocaine (LIDODERM) 5 % Place 1 patch onto the skin daily. Remove & Discard patch  within 12 hours or as directed by MD 10/16/20   Tacy Learn, PA-C  ?methylPREDNISolone (MEDROL DOSEPAK) 4 MG TBPK tablet Follow package insert 10/16/20   Tacy Learn, PA-C  ?   ? ?Allergies    ?Patient has no known allergies.   ? ?Review of Systems   ?Review of Systems  ?Constitutional:  Negative for chills and fever.  ?Gastrointestinal:  Negative for abdominal pain, nausea and vomiting.  ?Genitourinary:  Negative for dysuria and hematuria.  ?Musculoskeletal:  Positive for back pain. Negative for gait problem.  ?Skin:  Negative for color change and wound.  ?All other systems reviewed and are negative. ? ?Physical Exam ?Updated Vital Signs ?BP (!) 161/79   Pulse 89   Temp 98.1 ?F (36.7 ?C)   Resp 17   SpO2 99%  ?Physical Exam ?Vitals and nursing note reviewed.  ?Constitutional:   ?   General: He is not in acute distress. ?   Appearance: He is not diaphoretic.  ?HENT:  ?   Head: Normocephalic and atraumatic.  ?   Mouth/Throat:  ?   Pharynx: No oropharyngeal exudate.  ?Eyes:  ?   General: No scleral icterus. ?   Conjunctiva/sclera: Conjunctivae normal.  ?Cardiovascular:  ?   Rate and Rhythm: Normal rate and regular rhythm.  ?   Pulses: Normal pulses.  ?   Heart sounds: Normal heart sounds.  ?Pulmonary:  ?  Effort: Pulmonary effort is normal. No respiratory distress.  ?   Breath sounds: Normal breath sounds. No wheezing.  ?Abdominal:  ?   General: Bowel sounds are normal.  ?   Palpations: Abdomen is soft. There is no mass.  ?   Tenderness: There is no abdominal tenderness. There is no guarding or rebound.  ?Musculoskeletal:     ?   General: Normal range of motion.  ?   Cervical back: Normal range of motion and neck supple.  ?   Comments: Tenderness to palpation noted to lumbar spine.  Strength and sensation intact to bilateral lower extremities.  ?Skin: ?   General: Skin is warm and dry.  ?Neurological:  ?   Mental Status: He is alert.  ?Psychiatric:     ?   Behavior: Behavior normal.  ? ? ?ED Results /  Procedures / Treatments   ?Labs ?(all labs ordered are listed, but only abnormal results are displayed) ?Labs Reviewed  ?URINALYSIS, ROUTINE W REFLEX MICROSCOPIC - Abnormal; Notable for the following components:  ?    Result Value  ? Color, Urine STRAW (*)   ? Hgb urine dipstick SMALL (*)   ? All other components within normal limits  ?URINE CULTURE  ? ? ?EKG ?None ? ?Radiology ?CT Lumbar Spine Wo Contrast ? ?Result Date: 11/09/2021 ?CLINICAL DATA:  Low back pain and lower extremity swelling. EXAM: CT LUMBAR SPINE WITHOUT CONTRAST TECHNIQUE: Multidetector CT imaging of the lumbar spine was performed without intravenous contrast administration. Multiplanar CT image reconstructions were also generated. RADIATION DOSE REDUCTION: This exam was performed according to the departmental dose-optimization program which includes automated exposure control, adjustment of the mA and/or kV according to patient size and/or use of iterative reconstruction technique. COMPARISON:  MRI 01/30/2019. FINDINGS: Segmentation: 5 lumbar type vertebral bodies. Alignment: Normal alignment. Vertebrae: Old minor superior endplate depression at L1. No evidence of recent fracture. Paraspinal and other soft tissues: No significant finding. Aortic atherosclerosis. Renal cysts, not primarily or completely evaluated. Disc levels: No disc level pathology from T11-12 through L1-2. L2-3: Bulging of the disc with focal right foraminal to extraforaminal herniation. This would have some potential to affect the right L2 nerve. L3-4: Endplate osteophytes and circumferential protrusion of the disc. Facet and ligamentous prominence. Moderate multifactorial stenosis with potential for neural compression in the lateral recesses. L4-5: Circumferential protrusion of the disc. Facet and ligamentous hypertrophy. Severe multifactorial spinal stenosis which could cause neural compression on either or both sides. L5-S1: Endplate osteophytes and bulging of the disc. Mild  facet hypertrophy. Mild stenosis of the subarticular lateral recesses and neural foramina but without definite neural compression. IMPRESSION: L4-5: Severe multifactorial spinal stenosis that could cause neural compression on either or both sides. L3-4: Moderate multifactorial spinal stenosis which could cause neural compression in either lateral recess. L5-S1: Endplate osteophytes and bulging of the disc. Facet hypertrophy. Mild stenosis of the subarticular lateral recesses but without definite neural compression. L2-3: Right foraminal to extraforaminal disc herniation could focally affect the right L2 nerve. Electronically Signed   By: Nelson Chimes M.D.   On: 11/09/2021 19:04  ? ?VAS [place] LOWER EXTREMITY VENOUS (DVT) (7a-7p) ? ?Result Date: 11/09/2021 ? Lower Venous DVT Study Patient Name:  KHYREN MISKO [facility] [place]  Date of Exam:   11/09/2021 Medical Rec #: [id number]                    Accession #:    [id number] Date of Birth: 09-05-1950  Patient Gender: M Patient Age:   84 years Exam Location:  [facility] Procedure:      VAS [place] LOWER EXTREMITY VENOUS (DVT) Referring Phys: Vonzella Nipple Giacomo Valone --------------------------------------------------------------------------------  Indications: Lower extremity swelling.  Risk Factors: Recent extended travel. Comparison Study: No prior studies. Performing Technologist: Darlin Coco RDMS, RVT  Examination Guidelines: A complete evaluation includes B-mode imaging, spectral Doppler, color Doppler, and power Doppler as needed of all accessible portions of each vessel. Bilateral testing is considered an integral part of a complete examination. Limited examinations for reoccurring indications may be performed as noted. The reflux portion of the exam is performed with the patient in reverse Trendelenburg.  +---------+---------------+---------+-----------+----------+--------------+ RIGHT    CompressibilityPhasicitySpontaneityPropertiesThrombus Aging  +---------+---------------+---------+-----------+----------+--------------+ CFV      Full           Yes      Yes                                 +---------+---------------+---------+-----------+----------+--------------+ SFJ

## 2021-11-09 NOTE — Discharge Instructions (Addendum)
It was a pleasure taking care of you today!  ? ?Your urine did not show any infection today.  You didn't have a blood clot to your legs today. Your scan of your back didn't show any new fractures or dislocations. There were findings of spinal stenosis, however, you will be given prescriptions for ibuprofen, prednisone, and tizanidine. Take as directed.  Your urine didn't show infection, it will be sent for culture.  Attached is information for the on-call neurosurgeon, call and set up a follow-up appointment regarding today's ED visit.  Call your primary care provider to set up a follow-up appointment regarding today's ED visit.  Return to the emergency department for experiencing increasing/worsening back pain, inability to walk, numbness in the legs, or worsening symptoms. ?

## 2021-11-11 LAB — URINE CULTURE: Culture: <10000 — AB

## 2021-11-19 ENCOUNTER — Ambulatory Visit: Payer: Self-pay | Admitting: Surgery

## 2021-11-21 ENCOUNTER — Emergency Department (HOSPITAL_COMMUNITY): Payer: Medicare Other

## 2021-11-21 ENCOUNTER — Emergency Department (HOSPITAL_COMMUNITY)
Admission: EM | Admit: 2021-11-21 | Discharge: 2021-11-22 | Disposition: A | Discharge status: Home / Self Care | Payer: Medicare Other | Attending: Emergency Medicine | Admitting: Emergency Medicine

## 2021-11-21 ENCOUNTER — Encounter (HOSPITAL_COMMUNITY): Payer: Self-pay | Admitting: Emergency Medicine

## 2021-11-21 ENCOUNTER — Other Ambulatory Visit: Payer: Self-pay

## 2021-11-21 DIAGNOSIS — M10071 Idiopathic gout, right ankle and foot: Secondary | ICD-10-CM | POA: Diagnosis not present

## 2021-11-21 DIAGNOSIS — M109 Gout, unspecified: Secondary | ICD-10-CM

## 2021-11-21 DIAGNOSIS — M79671 Pain in right foot: Secondary | ICD-10-CM | POA: Diagnosis present

## 2021-11-21 NOTE — ED Provider Triage Note (Signed)
Emergency Medicine Provider Triage Evaluation Note  Tyrone Mcdonald , a 71 y.o. male  was evaluated in triage.  Pt complains of gradual onset, constant, worsening, R great toe pain/swelling for 2 weeks. Recently moved here from [place]. Hx of diabetes and HTN. Has not been taking anything specifically for pain. No hx of gout in the past. No trauma.   Review of Systems  Positive: + R great toe pain/swelling Negative: - fevers, chills  Physical Exam  BP (!) 158/81 (BP Location: Left Arm)   Pulse (!) 103   Temp 98.4 F (36.9 C) (Oral)   Resp 20   SpO2 98%  Gen:   Awake, no distress   Resp:  Normal effort  MSK:   Moves extremities without difficulty  Other:  + swelling, erythema, increased warmth to R 1st MTP joint with associated TTP. 2+ DP pulse.   Medical Decision Making  Medically screening exam initiated at 10:11 PM.  Appropriate orders placed.  Tyrone Mcdonald was informed that the remainder of the evaluation will be completed by another provider, this initial triage assessment does not replace that evaluation, and the importance of remaining in the ED until their evaluation is complete.     Tanda RockersVenter, Tyrone Obenchain, PA-C 11/21/21 2213

## 2021-11-21 NOTE — ED Triage Notes (Signed)
Patient reports right foot/right great toe pain for several days , denies injury or fall.  ?

## 2021-11-22 DIAGNOSIS — M10071 Idiopathic gout, right ankle and foot: Secondary | ICD-10-CM | POA: Diagnosis not present

## 2021-11-22 LAB — BASIC METABOLIC PANEL
Anion gap: 9 (ref 5–15)
BUN: 22 mg/dL (ref 8–23)
CO2: 21 mmol/L — ABNORMAL LOW (ref 22–32)
Calcium: 8.8 mg/dL — ABNORMAL LOW (ref 8.9–10.3)
Chloride: 107 mmol/L (ref 98–111)
Creatinine, Ser: 1.72 mg/dL — ABNORMAL HIGH (ref 0.61–1.24)
GFR, Estimated: 42 mL/min — ABNORMAL LOW (ref >60)
Glucose, Bld: 116 mg/dL — ABNORMAL HIGH (ref 70–99)
Potassium: 3.7 mmol/L (ref 3.5–5.1)
Sodium: 137 mmol/L (ref 135–145)

## 2021-11-22 LAB — CBC WITH DIFFERENTIAL/PLATELET
Abs Immature Granulocytes: 0.12 10*3/uL — ABNORMAL HIGH (ref 0.00–0.07)
Basophils Absolute: 0 10*3/uL (ref 0.0–0.1)
Basophils Relative: 0 %
Eosinophils Absolute: 0.3 10*3/uL (ref 0.0–0.5)
Eosinophils Relative: 2 %
HCT: 38.6 % — ABNORMAL LOW (ref 39.0–52.0)
Hemoglobin: 12.7 g/dL — ABNORMAL LOW (ref 13.0–17.0)
Immature Granulocytes: 1 %
Lymphocytes Relative: 24 %
Lymphs Abs: 2.7 10*3/uL (ref 0.7–4.0)
MCH: 26.9 pg (ref 26.0–34.0)
MCHC: 32.9 g/dL (ref 30.0–36.0)
MCV: 81.8 fL (ref 80.0–100.0)
Monocytes Absolute: 0.8 10*3/uL (ref 0.1–1.0)
Monocytes Relative: 7 %
Neutro Abs: 7.1 10*3/uL (ref 1.7–7.7)
Neutrophils Relative %: 66 %
Platelets: 196 10*3/uL (ref 150–400)
RBC: 4.72 MIL/uL (ref 4.22–5.81)
RDW: 13.9 % (ref 11.5–15.5)
WBC: 11 10*3/uL — ABNORMAL HIGH (ref 4.0–10.5)
nRBC: 0 % (ref 0.0–0.2)

## 2021-11-22 LAB — URIC ACID: Uric Acid, Serum: 8.3 mg/dL (ref 3.7–8.6)

## 2021-11-22 MED ORDER — COLCHICINE 0.6 MG PO TABS
1.2000 mg | ORAL_TABLET | ORAL | Status: AC
Start: 2021-11-22 — End: 2021-11-22
  Administered 2021-11-22: 1.2 mg via ORAL
  Filled 2021-11-22: qty 2

## 2021-11-22 MED ORDER — COLCHICINE 0.6 MG PO TABS: 0.6000 mg | ORAL_TABLET | Freq: Two times a day (BID) | ORAL | 0 refills | Status: AC

## 2021-11-22 NOTE — ED Provider Notes (Signed)
[facility] EMERGENCY DEPARTMENT Provider Note   CSN: [id number] Arrival date & time: 11/21/21  2131     History  Chief Complaint  Patient presents with   Right Foot Pain     Tyrone Mcdonald is a 71 y.o. male.  Patient presents to the emergency department for evaluation of toe pain.  Patient has been having pain in the base of the right toe for 2 weeks.  There has been some associated swelling.  Patient has not injured the area at all.  He denies prior similar episodes.      Home Medications Prior to Admission medications   Medication Sig Start Date End Date Taking? Authorizing Provider  colchicine 0.6 MG tablet Take 1 tablet (0.6 mg total) by mouth 2 (two) times daily. 11/22/21  Yes Fawn Desrocher, Canary Brim, MD  acetaminophen (TYLENOL) 500 MG tablet Take 1 tablet (500 mg total) by mouth 2 (two) times daily as needed for moderate pain. 04/28/20   Marcine Matar, MD  amLODipine (NORVASC) 10 MG tablet TAKE 1 TABLET (10 MG TOTAL) BY MOUTH DAILY. 04/28/20 04/28/21  Marcine Matar, MD  atorvastatin (LIPITOR) 10 MG tablet Take 1 tablet (10 mg total) by mouth daily. 04/29/20   Marcine Matar, MD  ibuprofen (ADVIL) 600 MG tablet Take 1 tablet (600 mg total) by mouth every 6 (six) hours as needed. 11/09/21   Blue, Soijett A, PA-C  lidocaine (LIDODERM) 5 % Place 1 patch onto the skin daily. Remove & Discard patch within 12 hours or as directed by MD 10/16/20   Jeannie Fend, PA-C  methylPREDNISolone (MEDROL DOSEPAK) 4 MG TBPK tablet Follow package insert 10/16/20   Jeannie Fend, PA-C      Allergies    Patient has no known allergies.    Review of Systems   Review of Systems  Physical Exam Updated Vital Signs BP (!) 158/81 (BP Location: Left Arm)   Pulse (!) 103   Temp 98.4 F (36.9 C) (Oral)   Resp 20   SpO2 98%  Physical Exam Vitals and nursing note reviewed.  Constitutional:      General: He is not in acute distress.    Appearance: He  is well-developed.  HENT:     Head: Normocephalic and atraumatic.     Mouth/Throat:     Mouth: Mucous membranes are moist.  Eyes:     General: Vision grossly intact. Gaze aligned appropriately.     Extraocular Movements: Extraocular movements intact.     Conjunctiva/sclera: Conjunctivae normal.  Cardiovascular:     Rate and Rhythm: Normal rate and regular rhythm.     Pulses: Normal pulses.     Heart sounds: Normal heart sounds, S1 normal and S2 normal. No murmur heard.   No friction rub. No gallop.  Pulmonary:     Effort: Pulmonary effort is normal. No respiratory distress.     Breath sounds: Normal breath sounds.  Abdominal:     Palpations: Abdomen is soft.     Tenderness: There is no abdominal tenderness. There is no guarding or rebound.     Hernia: No hernia is present.  Musculoskeletal:        General: No swelling.     Cervical back: Full passive range of motion without pain, normal range of motion and neck supple. No pain with movement, spinous process tenderness or muscular tenderness. Normal range of motion.     Right lower leg: No edema.     Left  lower leg: No edema.     Right foot: Decreased range of motion (First MTP). Tenderness (First MTP) present.  Skin:    General: Skin is warm and dry.     Capillary Refill: Capillary refill takes less than 2 seconds.     Findings: No ecchymosis, erythema, lesion or wound.  Neurological:     Mental Status: He is alert and oriented to person, place, and time.     GCS: GCS eye subscore is 4. GCS verbal subscore is 5. GCS motor subscore is 6.     Cranial Nerves: Cranial nerves 2-12 are intact.     Sensory: Sensation is intact.     Motor: Motor function is intact. No weakness or abnormal muscle tone.     Coordination: Coordination is intact.  Psychiatric:        Mood and Affect: Mood normal.        Speech: Speech normal.        Behavior: Behavior normal.    ED Results / Procedures / Treatments   Labs (all labs ordered are  listed, but only abnormal results are displayed) Labs Reviewed  CBC WITH DIFFERENTIAL/PLATELET - Abnormal; Notable for the following components:      Result Value   WBC 11.0 (*)    Hemoglobin 12.7 (*)    HCT 38.6 (*)    Abs Immature Granulocytes 0.12 (*)    All other components within normal limits  BASIC METABOLIC PANEL - Abnormal; Notable for the following components:   CO2 21 (*)    Glucose, Bld 116 (*)    Creatinine, Ser 1.72 (*)    Calcium 8.8 (*)    GFR, Estimated 42 (*)    All other components within normal limits  URIC ACID    EKG None  Radiology DG Toe Great Right  Result Date: 11/21/2021 CLINICAL DATA:  Pain and swelling, possible gout EXAM: RIGHT GREAT TOE COMPARISON:  01/06/2020 FINDINGS: Frontal, oblique, and lateral views of the right great toe are obtained. No acute fracture, subluxation, or dislocation. Mild joint space narrowing at the first metatarsophalangeal joint. There is diffuse soft tissue swelling. No soft tissue calcifications or subcutaneous gas. IMPRESSION: 1. Diffuse soft tissue swelling of the first digit. 2. No acute or destructive bony lesions. No evidence of crystalline or erosive arthropathy. 3. Mild osteoarthritis first metatarsophalangeal joint, stable. Electronically Signed   By: Sharlet Salina M.D.   On: 11/21/2021 22:47    Procedures Procedures    Medications Ordered in ED Medications  colchicine tablet 1.2 mg (has no administration in time range)    ED Course/ Medical Decision Making/ A&P                           Medical Decision Making Amount and/or Complexity of Data Reviewed Labs: ordered.   Patient presents to the emergency department for evaluation of right great toe pain.  He has been experiencing swelling and pain at the first MTP joint for 2 weeks.  No known history of gout.  Examination reveals tenderness of the joint with painful range of motion but no significant overlying erythema.  He does have some mild edema in the  region.  No induration or fluctuance.  He does not have risk factors for septic arthritis.  X-ray shows joint space narrowing but no acute pathology.  No fractures.  Lab work unremarkable.  Suspect gout.          Final Clinical Impression(s) /  ED Diagnoses Final diagnoses:  Acute gout involving toe of right foot, unspecified cause    Rx / DC Orders ED Discharge Orders          Ordered    colchicine 0.6 MG tablet  2 times daily        11/22/21 0359              Gilda Crease, MD 11/22/21 0400

## 2021-12-14 ENCOUNTER — Ambulatory Visit: Payer: Medicare Other | Admitting: Nurse Practitioner

## 2021-12-27 ENCOUNTER — Encounter (HOSPITAL_COMMUNITY)
Admission: RE | Admit: 2021-12-27 | Discharge: 2021-12-27 | Disposition: A | Discharge status: Home / Self Care | Payer: Medicare Other | Source: Ambulatory Visit | Attending: Anesthesiology | Admitting: Anesthesiology

## 2021-12-27 DIAGNOSIS — I10 Essential (primary) hypertension: Secondary | ICD-10-CM

## 2021-12-27 NOTE — Patient Instructions (Signed)
DUE TO COVID-19 ONLY TWO VISITORS  (aged 72 and older)  ARE ALLOWED TO COME WITH YOU AND STAY IN THE WAITING ROOM ONLY DURING PRE OP AND PROCEDURE.   **NO VISITORS ARE ALLOWED IN THE SHORT STAY AREA OR RECOVERY ROOM!!**  IF YOU WILL BE ADMITTED INTO THE HOSPITAL YOU ARE ALLOWED ONLY FOUR SUPPORT PEOPLE DURING VISITATION HOURS ONLY (7 AM -8PM)   The support person(s) must pass our screening, gel in and out, and wear a mask at all times, including in the patient's room. Patients must also wear a mask when staff or their support person are in the room. Visitors GUEST BADGE MUST BE WORN VISIBLY  One adult visitor may remain with you overnight and MUST be in the room by 8 P.M.     Your procedure is scheduled on: 01/03/22   Report to [facility] Main Entrance    Report to admitting at   5:15 AM   Call this number if you have problems the morning of surgery [phone]   Do not eat food :After Midnight.   After Midnight you may have the following liquids until _4:30 AM/  DAY OF SURGERY  Water Black Coffee (sugar ok, NO MILK/CREAM OR CREAMERS)  Tea (sugar ok, NO MILK/CREAM OR CREAMERS) regular and decaf                             Plain Jell-O (NO RED)                                           Fruit ices (not with fruit pulp, NO RED)                                     Popsicles (NO RED)                                                                  Juice: apple, WHITE grape, WHITE cranberry Sports drinks like Gatorade (NO RED) Clear broth(vegetable,chicken,beef)                           If you have questions, please contact your surgeon's office.   FOLLOW BOWEL PREP AND ANY ADDITIONAL PRE OP INSTRUCTIONS YOU RECEIVED FROM YOUR SURGEON'S OFFICE!!!     Oral Hygiene is also important to reduce your risk of infection.                                    Remember - BRUSH YOUR TEETH THE MORNING OF SURGERY WITH YOUR REGULAR TOOTHPASTE   Do NOT smoke after Midnight   Take  these medicines the morning of surgery with A SIP OF WATER: Amlodipine,colchicine,   Bring CPAP mask and tubing day of surgery.  You may not have any metal on your body including  jewelry, and body piercing             Do not wear lotions, powders, perfumes/cologne, or deodorant               Men may shave face and neck.   Do not bring valuables to the hospital. [facility] IS NOT             RESPONSIBLE   FOR VALUABLES.   Contacts, dentures or bridgework may not be worn into surgery.      Patients discharged on the day of surgery will not be allowed to drive home.  Someone NEEDS to stay with you for the first 24 hours after anesthesia.               Please read over the following fact sheets you were given: IF YOU HAVE QUESTIONS ABOUT YOUR PRE-OP INSTRUCTIONS PLEASE CALL [phone]     [facility] - Preparing for Surgery Before surgery, you can play an important role.  Because skin is not sterile, your skin needs to be as free of germs as possible.  You can reduce the number of germs on your skin by washing with CHG (chlorahexidine gluconate) soap before surgery.  CHG is an antiseptic cleaner which kills germs and bonds with the skin to continue killing germs even after washing. Please DO NOT use if you have an allergy to CHG or antibacterial soaps.  If your skin becomes reddened/irritated stop using the CHG and inform your nurse when you arrive at Short Stay.  You may shave your face/neck. Please follow these instructions carefully:  1.  Shower with CHG Soap the night before surgery and the  morning of Surgery.  2.  If you choose to wash your hair, wash your hair first as usual with your  normal  shampoo.  3.  After you shampoo, rinse your hair and body thoroughly to remove the  shampoo.                            4.  Use CHG as you would any other liquid soap.  You can apply chg directly  to the skin and wash                       Gently with a  scrungie or clean washcloth.  5.  Apply the CHG Soap to your body ONLY FROM THE NECK DOWN.   Do not use on face/ open                           Wound or open sores. Avoid contact with eyes, ears mouth and genitals (private parts).                       Wash face,  Genitals (private parts) with your normal soap.             6.  Wash thoroughly, paying special attention to the area where your surgery  will be performed.  7.  Thoroughly rinse your body with warm water from the neck down.  8.  DO NOT shower/wash with your normal soap after using and rinsing off  the CHG Soap.                9.  Pat yourself dry with  a clean towel.            10.  Wear clean pajamas.            11.  Place clean sheets on your bed the night of your first shower and do not  sleep with pets. Day of Surgery : Do not apply any lotions/deodorants the morning of surgery.  Please wear clean clothes to the hospital/surgery center.  FAILURE TO FOLLOW THESE INSTRUCTIONS MAY RESULT IN THE CANCELLATION OF YOUR SURGERY    ________________________________________________________________________

## 2022-01-03 ENCOUNTER — Encounter (HOSPITAL_COMMUNITY): Admission: RE | Payer: Self-pay | Source: Home / Self Care

## 2022-01-03 ENCOUNTER — Ambulatory Visit (HOSPITAL_COMMUNITY): Admission: RE | Admit: 2022-01-03 | Payer: Medicare Other | Source: Home / Self Care | Admitting: Surgery

## 2022-01-03 DIAGNOSIS — I10 Essential (primary) hypertension: Secondary | ICD-10-CM

## 2022-01-03 SURGERY — LAPAROSCOPIC CHOLECYSTECTOMY WITH INTRAOPERATIVE CHOLANGIOGRAM
Anesthesia: General

## 2022-06-08 ENCOUNTER — Emergency Department (HOSPITAL_COMMUNITY): Payer: Medicare Other

## 2022-06-08 ENCOUNTER — Other Ambulatory Visit: Payer: Self-pay

## 2022-06-08 ENCOUNTER — Emergency Department (HOSPITAL_COMMUNITY)
Admission: EM | Admit: 2022-06-08 | Discharge: 2022-06-09 | Disposition: A | Discharge status: Home / Self Care | Payer: Medicare Other | Attending: Emergency Medicine | Admitting: Emergency Medicine

## 2022-06-08 DIAGNOSIS — M25571 Pain in right ankle and joints of right foot: Secondary | ICD-10-CM | POA: Diagnosis present

## 2022-06-08 DIAGNOSIS — M79604 Pain in right leg: Secondary | ICD-10-CM | POA: Diagnosis not present

## 2022-06-08 DIAGNOSIS — N189 Chronic kidney disease, unspecified: Secondary | ICD-10-CM | POA: Insufficient documentation

## 2022-06-08 DIAGNOSIS — I129 Hypertensive chronic kidney disease with stage 1 through stage 4 chronic kidney disease, or unspecified chronic kidney disease: Secondary | ICD-10-CM | POA: Diagnosis not present

## 2022-06-08 DIAGNOSIS — J45909 Unspecified asthma, uncomplicated: Secondary | ICD-10-CM | POA: Insufficient documentation

## 2022-06-08 DIAGNOSIS — M25471 Effusion, right ankle: Secondary | ICD-10-CM

## 2022-06-08 DIAGNOSIS — M7918 Myalgia, other site: Secondary | ICD-10-CM
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
## 2022-06-08 NOTE — ED Provider Triage Note (Signed)
Emergency Medicine Provider Triage Evaluation Note  Tyrone Mcdonald , a 71 y.o. male  was evaluated in triage.  Pt complains of right leg pain. Starts at the ankle and radiates up. Denies back pain, recent injury. Denies history of DVT, recent travel, surgery. History of right ankle fracture 15 years ago. Endorsing climbing lots of stairs lately.  Review of Systems  Positive: As above Negative: As above  Physical Exam  BP (!) 154/96 (BP Location: Right Arm)   Pulse 91   Temp 99.1 F (37.3 C)   Resp 16   SpO2 95%  Gen:   Awake, no distress   Resp:  Normal effort  MSK:   Moves extremities without difficulty  Other:  2+ dp pulse on right. Ankle with ttp. Ttp over proximal right leg. No swelling in either lower extremity.   Medical Decision Making  Medically screening exam initiated at 10:39 PM.  Appropriate orders placed.  Tyrone Mcdonald was informed that the remainder of the evaluation will be completed by another provider, this initial triage assessment does not replace that evaluation, and the importance of remaining in the ED until their evaluation is complete.     Marita Kansas, PA-C 06/08/22 2241

## 2022-06-09 ENCOUNTER — Emergency Department (HOSPITAL_BASED_OUTPATIENT_CLINIC_OR_DEPARTMENT_OTHER): Payer: Medicare Other

## 2022-06-09 DIAGNOSIS — R52 Pain, unspecified: Secondary | ICD-10-CM

## 2022-06-09 DIAGNOSIS — M25571 Pain in right ankle and joints of right foot: Secondary | ICD-10-CM | POA: Diagnosis not present

## 2022-06-09 LAB — CBC WITH DIFFERENTIAL/PLATELET
Abs Immature Granulocytes: 0.03 10*3/uL (ref 0.00–0.07)
Basophils Absolute: 0.1 10*3/uL (ref 0.0–0.1)
Basophils Relative: 1 %
Eosinophils Absolute: 0.6 10*3/uL — ABNORMAL HIGH (ref 0.0–0.5)
Eosinophils Relative: 9 %
HCT: 41 % (ref 39.0–52.0)
Hemoglobin: 13.6 g/dL (ref 13.0–17.0)
Immature Granulocytes: 0 %
Lymphocytes Relative: 31 %
Lymphs Abs: 2.3 10*3/uL (ref 0.7–4.0)
MCH: 26.4 pg (ref 26.0–34.0)
MCHC: 33.2 g/dL (ref 30.0–36.0)
MCV: 79.6 fL — ABNORMAL LOW (ref 80.0–100.0)
Monocytes Absolute: 0.7 10*3/uL (ref 0.1–1.0)
Monocytes Relative: 9 %
Neutro Abs: 3.7 10*3/uL (ref 1.7–7.7)
Neutrophils Relative %: 50 %
Platelets: 200 10*3/uL (ref 150–400)
RBC: 5.15 MIL/uL (ref 4.22–5.81)
RDW: 13.2 % (ref 11.5–15.5)
WBC: 7.4 10*3/uL (ref 4.0–10.5)
nRBC: 0 % (ref 0.0–0.2)

## 2022-06-09 LAB — BASIC METABOLIC PANEL
Anion gap: 11 (ref 5–15)
BUN: 19 mg/dL (ref 8–23)
CO2: 22 mmol/L (ref 22–32)
Calcium: 9.4 mg/dL (ref 8.9–10.3)
Chloride: 106 mmol/L (ref 98–111)
Creatinine, Ser: 1.91 mg/dL — ABNORMAL HIGH (ref 0.61–1.24)
GFR, Estimated: 37 mL/min — ABNORMAL LOW (ref >60)
Glucose, Bld: 110 mg/dL — ABNORMAL HIGH (ref 70–99)
Potassium: 4.1 mmol/L (ref 3.5–5.1)
Sodium: 139 mmol/L (ref 135–145)

## 2022-06-09 MED ORDER — TRAMADOL HCL 50 MG PO TABS: 50.0000 mg | ORAL_TABLET | Freq: Four times a day (QID) | ORAL | 0 refills | Status: AC | PRN

## 2022-06-09 NOTE — Progress Notes (Addendum)
VASCULAR LAB    Right lower extremity venous duplex has been performed.  See CV proc for preliminary results.  Messaged negative results to Dr. Rush Landmark via secure chat.   Mieka Leaton, RVT 06/09/2022, 8:47 AM

## 2022-06-09 NOTE — Discharge Instructions (Signed)
Your history, exam, and work-up today did not show evidence of blood clot or acute fracture causing your ankle pain and swelling.  I suspect you have musculoskeletal pains and some degree of spasm.  Your labs were similar to prior with known kidney disease.  Please keep it elevated and rest and you may also use the small amount of pain medicine prescription we provide to help with discomfort.  If symptoms persist, please follow-up with your PCP and orthopedics.  If symptoms acutely worsen or you start having symptoms of infection, please return to the nearest emergency department for further evaluation.  We agreed together that there is a low suspicion for infection causing her symptoms at this time.

## 2022-06-09 NOTE — ED Provider Notes (Signed)
[facility] EMERGENCY DEPARTMENT Provider Note   CSN: [id number] Arrival date & time: 06/08/22  2221     History  Chief Complaint  Patient presents with   Leg Pain    Tyrone Mcdonald is a 71 y.o. male.  The history is provided by the patient and medical records. The history is limited by a language barrier. A language interpreter was used.  Leg Pain Location:  Ankle Time since incident:  2 days Injury: no   Ankle location:  R ankle Pain details:    Quality:  Aching   Radiates to:  Does not radiate (calf)   Severity:  Moderate   Onset quality:  Gradual   Duration:  2 days   Timing:  Constant   Progression:  Unchanged Chronicity:  New Tetanus status:  Unknown Prior injury to area:  Yes (years ago) Relieved by:  Nothing Worsened by:  Nothing Ineffective treatments:  None tried Associated symptoms: back pain (chroinic and unchanged) and swelling   Associated symptoms: no fatigue, no fever, no neck pain, no numbness, no stiffness and no tingling        Home Medications Prior to Admission medications   Medication Sig Start Date End Date Taking? Authorizing Provider  acetaminophen (TYLENOL) 500 MG tablet Take 1 tablet (500 mg total) by mouth 2 (two) times daily as needed for moderate pain. 04/28/20   Ladell Pier, MD  amLODipine (NORVASC) 10 MG tablet TAKE 1 TABLET (10 MG TOTAL) BY MOUTH DAILY. 04/28/20 04/28/21  Ladell Pier, MD  atorvastatin (LIPITOR) 10 MG tablet Take 1 tablet (10 mg total) by mouth daily. 04/29/20   Ladell Pier, MD  colchicine 0.6 MG tablet Take 1 tablet (0.6 mg total) by mouth 2 (two) times daily. 11/22/21   Orpah Greek, MD  ibuprofen (ADVIL) 600 MG tablet Take 1 tablet (600 mg total) by mouth every 6 (six) hours as needed. 11/09/21   Blue, Soijett A, PA-C  lidocaine (LIDODERM) 5 % Place 1 patch onto the skin daily. Remove & Discard patch within 12 hours or as directed by MD 10/16/20   Tacy Learn, PA-C  methylPREDNISolone (MEDROL DOSEPAK) 4 MG TBPK tablet Follow package insert 10/16/20   Tacy Learn, PA-C      Allergies    Patient has no known allergies.    Review of Systems   Review of Systems  Constitutional:  Negative for chills, fatigue and fever.  HENT:  Negative for congestion.   Respiratory:  Negative for cough, chest tightness, shortness of breath and wheezing.   Cardiovascular:  Positive for leg swelling. Negative for chest pain and palpitations.  Gastrointestinal:  Negative for abdominal pain, constipation, diarrhea, nausea and vomiting.  Genitourinary:  Negative for flank pain and frequency.  Musculoskeletal:  Positive for back pain (chroinic and unchanged). Negative for neck pain, neck stiffness and stiffness.  Skin:  Negative for rash and wound.  Neurological:  Negative for weakness, light-headedness, numbness and headaches.  Psychiatric/Behavioral:  Negative for agitation and confusion.   All other systems reviewed and are negative.   Physical Exam Updated Vital Signs BP (!) 156/98 (BP Location: Left Arm)   Pulse 89   Temp 98.8 F (37.1 C)   Resp 12   SpO2 97%  Physical Exam Vitals and nursing note reviewed.  Constitutional:      General: He is not in acute distress.    Appearance: He is well-developed. He is not ill-appearing, toxic-appearing or  diaphoretic.  HENT:     Head: Normocephalic and atraumatic.     Nose: Nose normal.     Mouth/Throat:     Mouth: Mucous membranes are moist.  Eyes:     Extraocular Movements: Extraocular movements intact.     Conjunctiva/sclera: Conjunctivae normal.     Pupils: Pupils are equal, round, and reactive to light.  Cardiovascular:     Rate and Rhythm: Normal rate and regular rhythm.     Heart sounds: No murmur heard. Pulmonary:     Effort: Pulmonary effort is normal. No respiratory distress.     Breath sounds: Normal breath sounds. No wheezing, rhonchi or rales.  Chest:     Chest wall: No  tenderness.  Abdominal:     Palpations: Abdomen is soft.     Tenderness: There is no abdominal tenderness. There is no guarding or rebound.  Musculoskeletal:        General: Swelling and tenderness present.     Cervical back: Neck supple.     Right lower leg: Swelling present.     Left lower leg: No edema.     Right ankle: Swelling present. Tenderness present.       Legs:  Skin:    General: Skin is warm and dry.     Capillary Refill: Capillary refill takes less than 2 seconds.     Findings: No erythema or rash.  Neurological:     General: No focal deficit present.     Mental Status: He is alert.     Sensory: No sensory deficit.     Motor: No weakness.  Psychiatric:        Mood and Affect: Mood normal.     ED Results / Procedures / Treatments   Labs (all labs ordered are listed, but only abnormal results are displayed) Labs Reviewed  CBC WITH DIFFERENTIAL/PLATELET - Abnormal; Notable for the following components:      Result Value   MCV 79.6 (*)    Eosinophils Absolute 0.6 (*)    All other components within normal limits  BASIC METABOLIC PANEL - Abnormal; Notable for the following components:   Glucose, Bld 110 (*)    Creatinine, Ser 1.91 (*)    GFR, Estimated 37 (*)    All other components within normal limits    EKG None  Radiology VAS [place] LOWER EXTREMITY VENOUS (DVT) (7a-7p)  Result Date: 06/09/2022  Lower Venous DVT Study Patient Name:  Tyrone Mcdonald  Date of Exam:   06/09/2022 Medical Rec #: [id number]                    Accession #:    [id number] Date of Birth: [birth year]                     Patient Gender: M Patient Age:   39 years Exam Location:  [facility] Procedure:      VAS [place] LOWER EXTREMITY VENOUS (DVT) Referring Phys: AMJAD ALI --------------------------------------------------------------------------------  Indications: Pain (lateral thigh).  Comparison Study: No prior study on file Performing Technologist: Sherren Kerns RVS   Examination Guidelines: A complete evaluation includes B-mode imaging, spectral Doppler, color Doppler, and power Doppler as needed of all accessible portions of each vessel. Bilateral testing is considered an integral part of a complete examination. Limited examinations for reoccurring indications may be performed as noted. The reflux portion of the exam is performed with the patient in reverse Trendelenburg.  +---------+---------------+---------+-----------+----------+--------------+ RIGHT  CompressibilityPhasicitySpontaneityPropertiesThrombus Aging +---------+---------------+---------+-----------+----------+--------------+ CFV      Full           Yes      Yes                                 +---------+---------------+---------+-----------+----------+--------------+ SFJ      Full                                                        +---------+---------------+---------+-----------+----------+--------------+ FV Prox  Full                                                        +---------+---------------+---------+-----------+----------+--------------+ FV Mid   Full                                                        +---------+---------------+---------+-----------+----------+--------------+ FV DistalFull                                                        +---------+---------------+---------+-----------+----------+--------------+ PFV      Full                                                        +---------+---------------+---------+-----------+----------+--------------+ POP      Full           Yes      Yes                                 +---------+---------------+---------+-----------+----------+--------------+ PTV      Full                                                        +---------+---------------+---------+-----------+----------+--------------+ PERO     Full                                                         +---------+---------------+---------+-----------+----------+--------------+   +----+---------------+---------+-----------+----------+--------------+ LEFTCompressibilityPhasicitySpontaneityPropertiesThrombus Aging +----+---------------+---------+-----------+----------+--------------+ CFV Full           Yes      Yes                                 +----+---------------+---------+-----------+----------+--------------+  Summary: RIGHT: - There is no evidence of deep vein thrombosis in the lower extremity.  - No cystic structure found in the popliteal fossa.  LEFT: - No evidence of common femoral vein obstruction.  *See table(s) above for measurements and observations.    Preliminary    DG Ankle Complete Right  Result Date: 06/08/2022 CLINICAL DATA:  Right ankle pain. EXAM: RIGHT ANKLE - COMPLETE 3+ VIEW COMPARISON:  None Available. FINDINGS: There is no acute fracture or dislocation. The bones are osteopenic. Probable old healed fracture deformity of the distal fibular diaphysis. The ankle mortise is intact. The soft tissues are unremarkable. IMPRESSION: 1. No acute fracture or dislocation. 2. Osteopenia. Electronically Signed   By: Elgie Collard M.D.   On: 06/08/2022 23:40    Procedures Procedures    Medications Ordered in ED Medications - No data to display  ED Course/ Medical Decision Making/ A&P                           Medical Decision Making   Tyrone Mcdonald is a 71 y.o. male with a past medical history significant for hypertension, CKD, hyperlipidemia, asthma, chronic back pain with sciatica, and previous right ankle surgery who presents with 2 days of right leg pain and swelling.  According to patient, he is unsure why it began, and he does not remember a focal trauma, but over the last 2 days had pain in his right ankle going up towards his calf.  He reports it feels slightly swollen.  He reports he had surgery on this ankle about 20 years ago and has done  well since.  He reports he lives at home with stairs and is having pain going up and down him.  He otherwise denies fevers, chills, congestion, cough, or other complaints.  He reports chronic pain in his back that goes down his leg that is unchanged from baseline.  He denies any numbness or weakness.  He denies any redness or rash.  Denies any other complaints aside from the pain today.  Reports the pain is moderate and worse when he tries to ambulate.  An Arabic speaking interpreter was used for all conversation with the patient.  On exam, patient does have swelling and tenderness to the ankle itself with some tenderness in the Achilles and calf area.  Knee was nontender.  He had good pulses, strength, and sensation otherwise.  No lacerations.  No warmth or redness.  Exam otherwise unremarkable.  Lungs clear and chest and abdomen nontender.  Patient was seen in triage and had imaging and labs ordered.  He will have some screening labs to look for significant abnormalities and will also get an ankle x-ray and a DVT ultrasound given the swelling and pain going to the calf.  If work-up is reassuring, anticipate he will be stable for discharge home. given his lack of fever, leukocytosis, overlying erythema, warmth, or skin changes, have low suspicion for septic arthritis and patient agrees this does not seem like infection to him.  We will hold on ankle aspiration at this time.  X-ray shows no fracture and ultrasound showed no DVT.  Labs overall are similar to prior with his known kidney disease.  EXTR did show osteopenia and I suspect he has some musculoskeletal pains.  Given the pain and swelling we will give him a very short prescription for some pain medicine to use if he needs it however as he was able  to ambulate on it we will hold on immobilization at this time.  Patient agrees with plan and agrees with discharge home.  He will be also given instructions to follow-up with PCP and orthopedics.  He no  other questions or concerns and was discharged in good condition.          Final Clinical Impression(s) / ED Diagnoses Final diagnoses:  Acute right ankle pain  Right ankle swelling  Musculoskeletal pain    Rx / DC Orders ED Discharge Orders          Ordered    traMADol (ULTRAM) 50 MG tablet  Every 6 hours PRN        06/09/22 1027            Clinical Impression: 1. Acute right ankle pain   2. Right ankle swelling   3. Musculoskeletal pain     Disposition: Discharge  Condition: Good  I have discussed the results, Dx and Tx plan with the pt(& family if present). He/she/they expressed understanding and agree(s) with the plan. Discharge instructions discussed at great length. Strict return precautions discussed and pt &/or family have verbalized understanding of the instructions. No further questions at time of discharge.    New Prescriptions   TRAMADOL (ULTRAM) 50 MG TABLET    Take 1 tablet (50 mg total) by mouth every 6 (six) hours as needed.    Follow Up: Ladell Pier, MD [address] [place] [address] [phone]     Marisa Sprinkles [address] [place] [address] [phone]   with ortho if it persists     Luca Dyar, Gwenyth Allegra, MD 06/09/22 1029

## 2023-01-31 ENCOUNTER — Emergency Department (HOSPITAL_COMMUNITY)
Admission: EM | Admit: 2023-01-31 | Discharge: 2023-01-31 | Disposition: A | Discharge status: Home / Self Care | Payer: 59 | Attending: Emergency Medicine | Admitting: Emergency Medicine

## 2023-01-31 ENCOUNTER — Other Ambulatory Visit: Payer: Self-pay

## 2023-01-31 ENCOUNTER — Encounter (HOSPITAL_COMMUNITY): Payer: Self-pay

## 2023-01-31 ENCOUNTER — Emergency Department (HOSPITAL_COMMUNITY): Payer: 59

## 2023-01-31 DIAGNOSIS — U071 COVID-19: Secondary | ICD-10-CM | POA: Diagnosis not present

## 2023-01-31 DIAGNOSIS — M545 Low back pain, unspecified: Secondary | ICD-10-CM

## 2023-01-31 DIAGNOSIS — R509 Fever, unspecified: Secondary | ICD-10-CM | POA: Diagnosis present

## 2023-01-31 DIAGNOSIS — N189 Chronic kidney disease, unspecified: Secondary | ICD-10-CM | POA: Diagnosis not present

## 2023-01-31 DIAGNOSIS — Z79899 Other long term (current) drug therapy: Secondary | ICD-10-CM | POA: Insufficient documentation

## 2023-01-31 DIAGNOSIS — M170 Bilateral primary osteoarthritis of knee: Secondary | ICD-10-CM

## 2023-01-31 DIAGNOSIS — I129 Hypertensive chronic kidney disease with stage 1 through stage 4 chronic kidney disease, or unspecified chronic kidney disease: Secondary | ICD-10-CM | POA: Diagnosis not present

## 2023-01-31 DIAGNOSIS — R000 Tachycardia, unspecified: Secondary | ICD-10-CM | POA: Diagnosis not present

## 2023-01-31 DIAGNOSIS — J45909 Unspecified asthma, uncomplicated: Secondary | ICD-10-CM | POA: Diagnosis not present

## 2023-01-31 DIAGNOSIS — G8929 Other chronic pain: Secondary | ICD-10-CM

## 2023-01-31 LAB — CBC WITH DIFFERENTIAL/PLATELET
Abs Immature Granulocytes: 0.02 10*3/uL (ref 0.00–0.07)
Basophils Absolute: 0 10*3/uL (ref 0.0–0.1)
Basophils Relative: 1 %
Eosinophils Absolute: 0.3 10*3/uL (ref 0.0–0.5)
Eosinophils Relative: 5 %
HCT: 40.3 % (ref 39.0–52.0)
Hemoglobin: 13.2 g/dL (ref 13.0–17.0)
Immature Granulocytes: 0 %
Lymphocytes Relative: 16 %
Lymphs Abs: 0.9 10*3/uL (ref 0.7–4.0)
MCH: 26.2 pg (ref 26.0–34.0)
MCHC: 32.8 g/dL (ref 30.0–36.0)
MCV: 80.1 fL (ref 80.0–100.0)
Monocytes Absolute: 0.5 10*3/uL (ref 0.1–1.0)
Monocytes Relative: 8 %
Neutro Abs: 4.1 10*3/uL (ref 1.7–7.7)
Neutrophils Relative %: 70 %
Platelets: 169 10*3/uL (ref 150–400)
RBC: 5.03 MIL/uL (ref 4.22–5.81)
RDW: 12.7 % (ref 11.5–15.5)
WBC: 5.8 10*3/uL (ref 4.0–10.5)
nRBC: 0 % (ref 0.0–0.2)

## 2023-01-31 LAB — PROTIME-INR
INR: 1.1 (ref 0.8–1.2)
Prothrombin Time: 14.1 seconds (ref 11.4–15.2)

## 2023-01-31 LAB — COMPREHENSIVE METABOLIC PANEL
ALT: 15 U/L (ref 0–44)
AST: 20 U/L (ref 15–41)
Albumin: 3.5 g/dL (ref 3.5–5.0)
Alkaline Phosphatase: 86 U/L (ref 38–126)
Anion gap: 10 (ref 5–15)
BUN: 20 mg/dL (ref 8–23)
CO2: 23 mmol/L (ref 22–32)
Calcium: 8.8 mg/dL — ABNORMAL LOW (ref 8.9–10.3)
Chloride: 104 mmol/L (ref 98–111)
Creatinine, Ser: 2.05 mg/dL — ABNORMAL HIGH (ref 0.61–1.24)
GFR, Estimated: 34 mL/min — ABNORMAL LOW (ref >60)
Glucose, Bld: 124 mg/dL — ABNORMAL HIGH (ref 70–99)
Potassium: 3.7 mmol/L (ref 3.5–5.1)
Sodium: 137 mmol/L (ref 135–145)
Total Bilirubin: 0.6 mg/dL (ref 0.3–1.2)
Total Protein: 6.8 g/dL (ref 6.5–8.1)

## 2023-01-31 LAB — SARS CORONAVIRUS 2 BY RT PCR: SARS Coronavirus 2 by RT PCR: POSITIVE — AB

## 2023-01-31 LAB — I-STAT CG4 LACTIC ACID, ED
Lactic Acid, Venous: 2.2 mmol/L (ref 0.5–1.9)
Lactic Acid, Venous: 1.8 mmol/L (ref 0.5–1.9)

## 2023-01-31 LAB — APTT: aPTT: 29 seconds (ref 24–36)

## 2023-01-31 MED ORDER — ACETAMINOPHEN 500 MG PO TABS: 1000.0000 mg | ORAL_TABLET | Freq: Four times a day (QID) | ORAL | 0 refills | Status: AC | PRN

## 2023-01-31 MED ORDER — LACTATED RINGERS IV BOLUS (SEPSIS)
1000.0000 mL | Freq: Once | INTRAVENOUS | Status: AC
  Administered 2023-01-31: 1000 mL via INTRAVENOUS

## 2023-01-31 NOTE — ED Provider Notes (Signed)
[place] EMERGENCY DEPARTMENT AT [facility] Provider Note   CSN: [id number] Arrival date & time: 01/31/23  2041     History  Chief Complaint  Patient presents with   Fever    Patient arrived via ems from home c/o bodyaches chills fever x 2 days ems vs 189/107 110 99%ra 20 99.5 cbg 104 given 650 tylenol in route    Tyrone Mcdonald is a 72 y.o. male.  Patient is a 72 year old male with a history of hypertension, CKD, asthma who is presenting today with 2 days of fever, myalgias, headache, cough, congestion, poor appetite.  He denies any vomiting or diarrhea.  No shortness of breath.  He does report multiple other family members with symptoms similar to his.  No tick exposure or rashes.  The history is provided by the patient.  Fever      Home Medications Prior to Admission medications   Medication Sig Start Date End Date Taking? Authorizing Provider  acetaminophen (TYLENOL) 500 MG tablet Take 2 tablets (1,000 mg total) by mouth every 6 (six) hours as needed for fever or headache (body aches). 01/31/23   Gwyneth Sprout, MD  amLODipine (NORVASC) 10 MG tablet TAKE 1 TABLET (10 MG TOTAL) BY MOUTH DAILY. 04/28/20 04/28/21  Marcine Matar, MD  atorvastatin (LIPITOR) 10 MG tablet Take 1 tablet (10 mg total) by mouth daily. 04/29/20   Marcine Matar, MD  colchicine 0.6 MG tablet Take 1 tablet (0.6 mg total) by mouth 2 (two) times daily. 11/22/21   Gilda Crease, MD  ibuprofen (ADVIL) 600 MG tablet Take 1 tablet (600 mg total) by mouth every 6 (six) hours as needed. 11/09/21   Blue, Soijett A, PA-C  lidocaine (LIDODERM) 5 % Place 1 patch onto the skin daily. Remove & Discard patch within 12 hours or as directed by MD 10/16/20   Jeannie Fend, PA-C  methylPREDNISolone (MEDROL DOSEPAK) 4 MG TBPK tablet Follow package insert 10/16/20   Jeannie Fend, PA-C  traMADol (ULTRAM) 50 MG tablet Take 1 tablet (50 mg total) by mouth every 6 (six) hours as  needed. 06/09/22   Tegeler, Canary Brim, MD      Allergies    Patient has no known allergies.    Review of Systems   Review of Systems  Constitutional:  Positive for fever.    Physical Exam Updated Vital Signs BP (!) 130/94   Pulse 90   Temp 98.9 F (37.2 C) (Oral)   Resp (!) 23   Ht 5\' 5"  (1.651 m)   Wt 85 kg   SpO2 99%   BMI 31.18 kg/m  Physical Exam Vitals and nursing note reviewed.  Constitutional:      General: He is not in acute distress.    Appearance: He is well-developed.  HENT:     Head: Normocephalic and atraumatic.     Right Ear: Tympanic membrane normal.     Left Ear: Tympanic membrane normal.     Nose: Congestion present.     Mouth/Throat:     Mouth: Mucous membranes are dry.     Pharynx: Posterior oropharyngeal erythema present.  Eyes:     Conjunctiva/sclera: Conjunctivae normal.     Pupils: Pupils are equal, round, and reactive to light.  Cardiovascular:     Rate and Rhythm: Regular rhythm. Tachycardia present.     Pulses: Normal pulses.     Heart sounds: No murmur heard. Pulmonary:     Effort: Pulmonary effort is  normal. No respiratory distress.     Breath sounds: Normal breath sounds. No wheezing or rales.  Abdominal:     General: There is no distension.     Palpations: Abdomen is soft.     Tenderness: There is no abdominal tenderness. There is no guarding or rebound.  Musculoskeletal:        General: No tenderness. Normal range of motion.     Cervical back: Normal range of motion and neck supple. No tenderness.     Right lower leg: No edema.     Left lower leg: No edema.  Skin:    General: Skin is warm and dry.     Findings: No erythema or rash.  Neurological:     Mental Status: He is alert and oriented to person, place, and time.  Psychiatric:        Behavior: Behavior normal.     ED Results / Procedures / Treatments   Labs (all labs ordered are listed, but only abnormal results are displayed) Labs Reviewed  SARS CORONAVIRUS 2  BY RT PCR - Abnormal; Notable for the following components:      Result Value   SARS Coronavirus 2 by RT PCR POSITIVE (*)    All other components within normal limits  COMPREHENSIVE METABOLIC PANEL - Abnormal; Notable for the following components:   Glucose, Bld 124 (*)    Creatinine, Ser 2.05 (*)    Calcium 8.8 (*)    GFR, Estimated 34 (*)    All other components within normal limits  I-STAT CG4 LACTIC ACID, ED - Abnormal; Notable for the following components:   Lactic Acid, Venous 2.2 (*)    All other components within normal limits  CULTURE, BLOOD (ROUTINE X 2)  CULTURE, BLOOD (ROUTINE X 2)  CBC WITH DIFFERENTIAL/PLATELET  PROTIME-INR  APTT  URINALYSIS, W/ REFLEX TO CULTURE (INFECTION SUSPECTED)  I-STAT CG4 LACTIC ACID, ED    EKG EKG Interpretation Date/Time:  Friday January 31 2023 21:27:04 EDT Ventricular Rate:  106 PR Interval:  140 QRS Duration:  69 QT Interval:  307 QTC Calculation: 408 R Axis:   30  Text Interpretation: Sinus tachycardia Borderline T wave abnormalities No significant change since last tracing Confirmed by Gwyneth Sprout ([id number]) on 01/31/2023 9:40:05 PM  Radiology DG Chest Port 1 View  Result Date: 01/31/2023 CLINICAL DATA:  Possible sepsis EXAM: PORTABLE CHEST 1 VIEW COMPARISON:  08/11/2021 FINDINGS: The heart size and mediastinal contours are within normal limits. Both lungs are clear. The visualized skeletal structures are unremarkable. IMPRESSION: No active disease. Electronically Signed   By: Ernie Avena M.D.   On: 01/31/2023 21:30    Procedures Procedures    Medications Ordered in ED Medications  lactated ringers bolus 1,000 mL (0 mLs Intravenous Stopped 01/31/23 2206)    ED Course/ Medical Decision Making/ A&P                             Medical Decision Making Amount and/or Complexity of Data Reviewed Labs: ordered. Decision-making details documented in ED Course. Radiology: ordered and independent interpretation performed.  Decision-making details documented in ED Course. ECG/medicine tests: ordered and independent interpretation performed. Decision-making details documented in ED Course.   Pt with multiple medical problems and comorbidities and presenting today with a complaint that caries a high risk for morbidity and mortality.  Here today with symptoms most classic for viral syndrome such as COVID.  Also possibility for sepsis or  pneumonia.  Patient sats are within normal limits on room air, febrile upon arrival which improved with acetaminophen given prior.  I independently interpreted patient's labs and lactate mildly elevated at 2.2 and repeat normal at 1.8, COVID is positive, CMP with normal electrolytes but creatinine of 2.05 mildly up from his baseline at 1.8, CBC within normal limits.  I have independently visualized and interpreted pt's images today.  Chest x-ray without acute findings.  On repeat evaluation patient is feeling much better.  Discussed with him that he has COVID and that he will need to take antipyretics, rest and stay hydrated.  He is comfortable with this plan and is ready to go home.  No social determinants affecting his discharge today.  No indication for admission or further testing at this time.          Final Clinical Impression(s) / ED Diagnoses Final diagnoses:  COVID    Rx / DC Orders ED Discharge Orders          Ordered    acetaminophen (TYLENOL) 500 MG tablet  Every 6 hours PRN        01/31/23 2340              Gwyneth Sprout, MD 01/31/23 2340

## 2023-01-31 NOTE — Discharge Instructions (Signed)
Return to the ER if you start having any shortness of breath or feeling worse.  You will most likely feel sick for the next 4-5 more days.  Make sure you are drinking plenty of fluids, using Tylenol as needed for discomfort and getting plenty of rest.

## 2023-01-31 NOTE — ED Notes (Signed)
Patient presents via ems from home c/o 2 day hx of fever bodyaches and chills. Patient tachycardic febrile htn. Patient has not taken bp meds in 2 days. Patient a/o x 4 respirations even and non labored hot to touch clammy lungs cta no noted cough uses cane to ambulate

## 2023-09-16 ENCOUNTER — Other Ambulatory Visit: Payer: Self-pay | Admitting: Internal Medicine

## 2023-09-16 DIAGNOSIS — M25561 Pain in right knee: Secondary | ICD-10-CM

## 2023-09-30 ENCOUNTER — Other Ambulatory Visit

## 2024-05-29 ENCOUNTER — Emergency Department (HOSPITAL_COMMUNITY)

## 2024-05-29 ENCOUNTER — Emergency Department (HOSPITAL_COMMUNITY)
Admission: EM | Admit: 2024-05-29 | Discharge: 2024-05-29 | Disposition: A | Discharge status: Home / Self Care | Attending: Emergency Medicine | Admitting: Emergency Medicine

## 2024-05-29 DIAGNOSIS — Z79899 Other long term (current) drug therapy: Secondary | ICD-10-CM | POA: Diagnosis not present

## 2024-05-29 DIAGNOSIS — R110 Nausea: Secondary | ICD-10-CM | POA: Diagnosis not present

## 2024-05-29 DIAGNOSIS — R140 Abdominal distension (gaseous): Secondary | ICD-10-CM | POA: Insufficient documentation

## 2024-05-29 DIAGNOSIS — R0789 Other chest pain: Secondary | ICD-10-CM | POA: Diagnosis present

## 2024-05-29 DIAGNOSIS — R1030 Lower abdominal pain, unspecified: Secondary | ICD-10-CM | POA: Diagnosis not present

## 2024-05-29 DIAGNOSIS — I10 Essential (primary) hypertension: Secondary | ICD-10-CM | POA: Diagnosis not present

## 2024-05-29 DIAGNOSIS — R509 Fever, unspecified: Secondary | ICD-10-CM | POA: Diagnosis not present

## 2024-05-29 LAB — I-STAT CHEM 8, ED
BUN: 23 mg/dL (ref 8–23)
Calcium, Ion: 1.17 mmol/L (ref 1.15–1.40)
Chloride: 107 mmol/L (ref 98–111)
Creatinine, Ser: 2.1 mg/dL — ABNORMAL HIGH (ref 0.61–1.24)
Glucose, Bld: 96 mg/dL (ref 70–99)
HCT: 36 % — ABNORMAL LOW (ref 39.0–52.0)
Hemoglobin: 12.2 g/dL — ABNORMAL LOW (ref 13.0–17.0)
Potassium: 4.1 mmol/L (ref 3.5–5.1)
Sodium: 142 mmol/L (ref 135–145)
TCO2: 22 mmol/L (ref 22–32)

## 2024-05-29 LAB — URINALYSIS, W/ REFLEX TO CULTURE (INFECTION SUSPECTED)
Bilirubin Urine: NEGATIVE
Glucose, UA: NEGATIVE mg/dL
Hgb urine dipstick: NEGATIVE
Ketones, ur: NEGATIVE mg/dL
Leukocytes,Ua: NEGATIVE
Nitrite: NEGATIVE
Protein, ur: NEGATIVE mg/dL
Specific Gravity, Urine: 1.01 (ref 1.005–1.030)
pH: 7 (ref 5.0–8.0)

## 2024-05-29 LAB — COMPREHENSIVE METABOLIC PANEL WITH GFR
ALT: 8 U/L (ref 0–44)
AST: 17 U/L (ref 15–41)
Albumin: 3.2 g/dL — ABNORMAL LOW (ref 3.5–5.0)
Alkaline Phosphatase: 76 U/L (ref 38–126)
Anion gap: 13 (ref 5–15)
BUN: 21 mg/dL (ref 8–23)
CO2: 19 mmol/L — ABNORMAL LOW (ref 22–32)
Calcium: 9.1 mg/dL (ref 8.9–10.3)
Chloride: 107 mmol/L (ref 98–111)
Creatinine, Ser: 1.97 mg/dL — ABNORMAL HIGH (ref 0.61–1.24)
GFR, Estimated: 35 mL/min — ABNORMAL LOW (ref >60)
Glucose, Bld: 90 mg/dL (ref 70–99)
Potassium: 4 mmol/L (ref 3.5–5.1)
Sodium: 139 mmol/L (ref 135–145)
Total Bilirubin: 1 mg/dL (ref 0.0–1.2)
Total Protein: 6.8 g/dL (ref 6.5–8.1)

## 2024-05-29 LAB — CBC WITH DIFFERENTIAL/PLATELET
Abs Immature Granulocytes: 0.05 K/uL (ref 0.00–0.07)
Basophils Absolute: 0 K/uL (ref 0.0–0.1)
Basophils Relative: 1 %
Eosinophils Absolute: 0.3 K/uL (ref 0.0–0.5)
Eosinophils Relative: 6 %
HCT: 37.3 % — ABNORMAL LOW (ref 39.0–52.0)
Hemoglobin: 12.1 g/dL — ABNORMAL LOW (ref 13.0–17.0)
Immature Granulocytes: 1 %
Lymphocytes Relative: 37 %
Lymphs Abs: 2 K/uL (ref 0.7–4.0)
MCH: 26.2 pg (ref 26.0–34.0)
MCHC: 32.4 g/dL (ref 30.0–36.0)
MCV: 80.9 fL (ref 80.0–100.0)
Monocytes Absolute: 0.4 K/uL (ref 0.1–1.0)
Monocytes Relative: 7 %
Neutro Abs: 2.7 K/uL (ref 1.7–7.7)
Neutrophils Relative %: 48 %
Platelets: 202 K/uL (ref 150–400)
RBC: 4.61 MIL/uL (ref 4.22–5.81)
RDW: 13 % (ref 11.5–15.5)
WBC: 5.4 K/uL (ref 4.0–10.5)
nRBC: 0 % (ref 0.0–0.2)

## 2024-05-29 LAB — RESP PANEL BY RT-PCR (RSV, FLU A&B, COVID)  RVPGX2
Influenza A by PCR: NEGATIVE
Influenza B by PCR: NEGATIVE
Resp Syncytial Virus by PCR: NEGATIVE
SARS Coronavirus 2 by RT PCR: NEGATIVE

## 2024-05-29 LAB — TROPONIN I (HIGH SENSITIVITY)
Troponin I (High Sensitivity): 9 ng/L (ref <18)
Troponin I (High Sensitivity): 8 ng/L (ref <18)

## 2024-05-29 LAB — LIPASE, BLOOD: Lipase: 30 U/L (ref 11–51)

## 2024-05-29 LAB — I-STAT CG4 LACTIC ACID, ED
Lactic Acid, Venous: 1.4 mmol/L (ref 0.5–1.9)
Lactic Acid, Venous: 0.8 mmol/L (ref 0.5–1.9)

## 2024-05-29 MED ORDER — AMOXICILLIN-POT CLAVULANATE 875-125 MG PO TABS: 1.0000 | ORAL_TABLET | Freq: Two times a day (BID) | ORAL | 0 refills | Status: AC

## 2024-05-29 MED ORDER — SODIUM CHLORIDE 0.9 % IV BOLUS
500.0000 mL | Freq: Once | INTRAVENOUS | Status: AC
  Administered 2024-05-29: 500 mL via INTRAVENOUS

## 2024-05-29 MED ORDER — ACETAMINOPHEN 500 MG PO TABS
1000.0000 mg | ORAL_TABLET | Freq: Once | ORAL | Status: AC
  Administered 2024-05-29: 1000 mg via ORAL
  Filled 2024-05-29: qty 2

## 2024-05-29 NOTE — Discharge Instructions (Signed)
 Return for any problem.   Symptoms today are likely from a viral infection.  Get plenty of rest.  Drink plenty of fluids.  Take antibiotics, as prescribed, out of precaution for possible early bacterial infection.

## 2024-05-29 NOTE — ED Triage Notes (Signed)
 BIB EMS from home r/t sudden mid CP and body aches that started today while sleeping. EMS administered 324mg  ASA and 1 nitroglycerin enroute with slight improvement. Reports recent fevers and nausea.

## 2024-05-29 NOTE — ED Provider Notes (Signed)
 [place] EMERGENCY DEPARTMENT AT [facility] Provider Note   CSN: [id number] Arrival date & time: 05/29/24  9081     Patient presents with: Chest Pain   Maxden Eltom Phillippe Orlick is a 73 y.o. male.   73 year old male with prior medical history as detailed below presents for evaluation.  Patient is Arabic speaking.  Translator services utilized for interview.  Patient complains of diffuse pain all over that began yesterday.  Symptoms were worse this morning.  Therefore he called EMS.  He complains of profound weakness.  He reports nausea and associated fever.  EMS was concerned primarily about reported chest pain.  EKG did not suggest STEMI.  Patient was given 324 mg of aspirin and 1 sublingual nitroglycerin during transport.  Patient is hypertensive with EMS into the 160s systolic.  Patient denies vomiting.  He denies diarrhea.  He reports that his last urination was yesterday.  The history is provided by the patient and medical records.       Prior to Admission medications   Medication Sig Start Date End Date Taking? Authorizing Provider  acetaminophen  (TYLENOL ) 500 MG tablet Take 2 tablets (1,000 mg total) by mouth every 6 (six) hours as needed for fever or headache (body aches). 01/31/23   Doretha Folks, MD  amLODipine  (NORVASC ) 10 MG tablet TAKE 1 TABLET (10 MG TOTAL) BY MOUTH DAILY. 04/28/20 04/28/21  Vicci Barnie NOVAK, MD  atorvastatin  (LIPITOR) 10 MG tablet Take 1 tablet (10 mg total) by mouth daily. 04/29/20   Vicci Barnie NOVAK, MD  colchicine  0.6 MG tablet Take 1 tablet (0.6 mg total) by mouth 2 (two) times daily. 11/22/21   Haze Lonni PARAS, MD  ibuprofen  (ADVIL ) 600 MG tablet Take 1 tablet (600 mg total) by mouth every 6 (six) hours as needed. 11/09/21   Blue, Soijett A, PA-C  lidocaine  (LIDODERM ) 5 % Place 1 patch onto the skin daily. Remove & Discard patch within 12 hours or as directed by MD 10/16/20   Beverley Leita LABOR, PA-C  methylPREDNISolone   (MEDROL  DOSEPAK) 4 MG TBPK tablet Follow package insert 10/16/20   Beverley Leita LABOR, PA-C  traMADol  (ULTRAM ) 50 MG tablet Take 1 tablet (50 mg total) by mouth every 6 (six) hours as needed. 06/09/22   Tegeler, Lonni PARAS, MD    Allergies: Patient has no known allergies.    Review of Systems  All other systems reviewed and are negative.   Updated Vital Signs BP (!) 163/101   Pulse 76   Temp (!) 97.5 F (36.4 C) (Oral)   Resp 20   SpO2 100%   Physical Exam Vitals and nursing note reviewed.  Constitutional:      General: He is not in acute distress.    Appearance: He is well-developed.  HENT:     Head: Normocephalic and atraumatic.  Eyes:     Conjunctiva/sclera: Conjunctivae normal.  Cardiovascular:     Rate and Rhythm: Normal rate and regular rhythm.     Heart sounds: No murmur heard. Pulmonary:     Effort: Pulmonary effort is normal. No respiratory distress.     Breath sounds: Normal breath sounds.  Abdominal:     Palpations: Abdomen is soft.     Tenderness: There is no abdominal tenderness.     Comments: Abdomen with suprapubic tenderness and palpable distended bladder.  Musculoskeletal:        General: No swelling.     Cervical back: Neck supple.  Skin:    General: Skin is  warm and dry.     Capillary Refill: Capillary refill takes less than 2 seconds.  Neurological:     Mental Status: He is alert.  Psychiatric:        Mood and Affect: Mood normal.     (all labs ordered are listed, but only abnormal results are displayed) Labs Reviewed  CULTURE, BLOOD (ROUTINE X 2)  CULTURE, BLOOD (ROUTINE X 2)  RESP PANEL BY RT-PCR (RSV, FLU A&B, COVID)  [id number]  COMPREHENSIVE METABOLIC PANEL WITH GFR  LIPASE, BLOOD  URINALYSIS, W/ REFLEX TO CULTURE (INFECTION SUSPECTED)  CBC WITH DIFFERENTIAL/PLATELET  I-STAT CHEM 8, ED  I-STAT CG4 LACTIC ACID, ED  TROPONIN I (HIGH SENSITIVITY)    EKG: None  Radiology: No results found.   Procedures   Medications Ordered in  the ED - No data to display                                  Medical Decision Making Patient presented with diffuse bodyaches and malaise.  Importantly, patient without clear communication of chest pain on translator utilized interview.  EKG is without acute ischemia.  Troponins are without significant elevation or delta.  Symptoms improved on reevaluation.  Screening labs and workup on the whole are without significant acute abnormality noted.  Patient with baseline renal dysfunction.  No evidence of significant AKI noted.  No evidence of significant bacterial infection noted.  Imaging obtained is also without acute abnormality.  On reevaluation he feels improved.  Patient's CT chest is suggestive of possible very early infection in the lung.  I do not think that clinically the patient has evidence of pneumonia.  However, after discussion with the patient he is most comfortable with course of Augmentin  to treat possible early bacterial pneumonia.  I do not think that his symptoms are likely related to bacterial infection.  Importance of close follow-up is stressed.  Strict return precautions given and understood.  Amount and/or Complexity of Data Reviewed Labs: ordered. Radiology: ordered.  Risk OTC drugs. Prescription drug management.        Final diagnoses:  Atypical chest pain    ED Discharge Orders          Ordered    amoxicillin -clavulanate (AUGMENTIN ) 875-125 MG tablet  Every 12 hours        05/29/24 1301               Laurice Maude BROCKS, MD 05/29/24 1306

## 2024-06-03 LAB — CULTURE, BLOOD (ROUTINE X 2)
Culture: NO GROWTH
Culture: NO GROWTH
Special Requests: ADEQUATE
